# Patient Record
Sex: Female | Born: 1970 | Race: White | Hispanic: No | Marital: Married | Smoking: Former smoker
Health system: Southern US, Community
[De-identification: ages and names within clinical notes are randomized; demographics above are authoritative.]

## PROBLEM LIST (undated history)

## (undated) DIAGNOSIS — Z8489 Family history of other specified conditions: Secondary | ICD-10-CM

## (undated) DIAGNOSIS — F329 Major depressive disorder, single episode, unspecified: Secondary | ICD-10-CM

## (undated) DIAGNOSIS — F419 Anxiety disorder, unspecified: Secondary | ICD-10-CM

## (undated) DIAGNOSIS — F32A Depression, unspecified: Secondary | ICD-10-CM

## (undated) DIAGNOSIS — M797 Fibromyalgia: Secondary | ICD-10-CM

## (undated) DIAGNOSIS — L719 Rosacea, unspecified: Secondary | ICD-10-CM

## (undated) DIAGNOSIS — K219 Gastro-esophageal reflux disease without esophagitis: Secondary | ICD-10-CM

## (undated) DIAGNOSIS — Z8719 Personal history of other diseases of the digestive system: Secondary | ICD-10-CM

## (undated) DIAGNOSIS — Z87442 Personal history of urinary calculi: Secondary | ICD-10-CM

## (undated) DIAGNOSIS — I499 Cardiac arrhythmia, unspecified: Secondary | ICD-10-CM

## (undated) DIAGNOSIS — I340 Nonrheumatic mitral (valve) insufficiency: Secondary | ICD-10-CM

## (undated) DIAGNOSIS — J189 Pneumonia, unspecified organism: Secondary | ICD-10-CM

## (undated) HISTORY — DX: Depression, unspecified: F32.A

## (undated) HISTORY — PX: FRACTURE SURGERY: SHX138

## (undated) HISTORY — PX: LITHOTRIPSY: SUR834

## (undated) HISTORY — PX: JOINT REPLACEMENT: SHX530

## (undated) HISTORY — DX: Cardiac arrhythmia, unspecified: I49.9

## (undated) HISTORY — PX: WISDOM TOOTH EXTRACTION: SHX21

## (undated) HISTORY — PX: CRYOTHERAPY: SHX1416

## (undated) HISTORY — DX: Major depressive disorder, single episode, unspecified: F32.9

---

## 2008-11-05 ENCOUNTER — Emergency Department (HOSPITAL_COMMUNITY): Admission: EM | Admit: 2008-11-05 | Discharge: 2008-11-05 | Payer: Self-pay | Admitting: Emergency Medicine

## 2010-08-31 ENCOUNTER — Encounter: Payer: Self-pay | Admitting: Gastroenterology

## 2010-09-22 ENCOUNTER — Encounter: Payer: Self-pay | Admitting: Gastroenterology

## 2010-09-22 ENCOUNTER — Ambulatory Visit (INDEPENDENT_AMBULATORY_CARE_PROVIDER_SITE_OTHER): Payer: BC Managed Care – PPO | Admitting: Gastroenterology

## 2010-09-22 VITALS — BP 124/72 | HR 80 | Ht 60.0 in | Wt 157.0 lb

## 2010-09-22 DIAGNOSIS — K602 Anal fissure, unspecified: Secondary | ICD-10-CM

## 2010-09-22 DIAGNOSIS — K625 Hemorrhage of anus and rectum: Secondary | ICD-10-CM | POA: Insufficient documentation

## 2010-09-22 MED ORDER — MESALAMINE 1000 MG RE SUPP
RECTAL | Status: DC
Start: 2010-09-22 — End: 2011-07-16

## 2010-09-22 MED ORDER — LIDOCAINE HCL 2 % EX GEL: CUTANEOUS | Status: DC | PRN

## 2010-09-22 NOTE — Progress Notes (Signed)
History of Present Illness:  This is a 40 year old Caucasian female self referred for 2 months of bright red blood per rectum with occasional burning rectal discomfort. She has similar problem in 2000, and apparently had a negative sigmoidoscopy in [place]. He follows a regular diet and denies a specific food intolerances. She otherwise is in good health and is on the medications and several multivitamins. She and her husband are trying to become pregnant, but she has not missed a menstrual period. She denies any urologic or gynecologic problems. She also denies upper GI or general medical issues. She usually has one to 2 bowel movements a day without diarrhea or lower abdominal discomfort.  I have reviewed this patient's present history, medical and surgical past history, allergies and medications.     ROS: The remainder of the 10 point ROS is negative     Physical Exam: General well developed well nourished patient in no acute distress, appearing her stated age Eyes PERRLA, no icterus, fundoscopic exam per opthamologist Skin no lesions noted Neck supple, no adenopathy, no thyroid enlargement, no tenderness Chest clear to percussion and auscultation Heart no significant murmurs, gallops or rubs noted Abdomen no hepatosplenomegaly masses or tenderness, BS normal.  Rectal there is a small superficial anterior fissure noted but no fistulae or perianal lesions. I cannot appreciate rectal masses or tenderness, and stool is guaiac negative. Extremities no acute joint lesions, edema, phlebitis or evidence of cellulitis. Neurologic patient oriented x 3, cranial nerves intact, no focal neurologic deficits noted. Psychological mental status normal and normal affect.  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 I have placed her on Sitz baths at bedtime with 1 g and also suppositories, and when necessary 5% Xylocaine gel, high-fiber diet with  daily Benefiber and liberal by mouth fluids, and we'll schedule colonoscopy at her convenience. We will send this note to [facility].  No diagnosis found.

## 2010-09-22 NOTE — Patient Instructions (Signed)
We have sent Canasa supp use one per rectum every night for 1 week, then as needed. We have sent an rx for Xylocaine gel use per rectum as needed.  Follow the High fiber diet given today.  You were given a handout on rectal fissures.  We will call back to schedule your colonoscopy and previsit when the computers come back up.

## 2010-09-23 ENCOUNTER — Telehealth: Payer: Self-pay | Admitting: Gastroenterology

## 2010-10-22 NOTE — Telephone Encounter (Signed)
Left another Vmail for pt to call & schedule procedure.

## 2010-10-23 ENCOUNTER — Other Ambulatory Visit (HOSPITAL_COMMUNITY): Payer: Self-pay | Admitting: Obstetrics and Gynecology

## 2010-10-23 DIAGNOSIS — N979 Female infertility, unspecified: Secondary | ICD-10-CM

## 2010-10-30 ENCOUNTER — Ambulatory Visit (HOSPITAL_COMMUNITY)
Admission: RE | Admit: 2010-10-30 | Discharge: 2010-10-30 | Disposition: A | Discharge status: Home / Self Care | Payer: BC Managed Care – PPO | Source: Ambulatory Visit | Attending: Obstetrics and Gynecology | Admitting: Obstetrics and Gynecology

## 2010-10-30 DIAGNOSIS — N979 Female infertility, unspecified: Secondary | ICD-10-CM

## 2010-10-30 MED ORDER — IOHEXOL 300 MG/ML  SOLN: 9.0000 mL | Freq: Once | INTRAMUSCULAR | Status: AC | PRN

## 2011-03-01 ENCOUNTER — Ambulatory Visit (AMBULATORY_SURGERY_CENTER): Payer: BC Managed Care – PPO | Admitting: *Deleted

## 2011-03-01 VITALS — Ht 60.0 in | Wt 158.0 lb

## 2011-03-01 DIAGNOSIS — K602 Anal fissure, unspecified: Secondary | ICD-10-CM

## 2011-03-01 DIAGNOSIS — K625 Hemorrhage of anus and rectum: Secondary | ICD-10-CM

## 2011-03-01 MED ORDER — PEG-KCL-NACL-NASULF-NA ASC-C 100 G PO SOLR: ORAL | Status: DC

## 2011-03-02 ENCOUNTER — Encounter: Payer: Self-pay | Admitting: Gastroenterology

## 2011-03-15 ENCOUNTER — Ambulatory Visit (AMBULATORY_SURGERY_CENTER): Payer: BC Managed Care – PPO | Admitting: Gastroenterology

## 2011-03-15 ENCOUNTER — Encounter: Payer: Self-pay | Admitting: Gastroenterology

## 2011-03-15 VITALS — BP 116/86 | HR 85 | Temp 98.4°F | Resp 20 | Ht 60.0 in | Wt 158.0 lb

## 2011-03-15 DIAGNOSIS — K625 Hemorrhage of anus and rectum: Secondary | ICD-10-CM

## 2011-03-15 DIAGNOSIS — K602 Anal fissure, unspecified: Secondary | ICD-10-CM

## 2011-03-15 HISTORY — PX: COLONOSCOPY: SHX174

## 2011-03-15 MED ORDER — SODIUM CHLORIDE 0.9 % IV SOLN: 500.0000 mL | INTRAVENOUS | Status: DC

## 2011-03-15 NOTE — Patient Instructions (Signed)
Resume your prior medications today. Please call if any questions or concerns.   Handout given on high fiber diet.  Per Dr. Sharlett Iles use either metamucil ar benefiber daily.     YOU HAD AN ENDOSCOPIC PROCEDURE TODAY AT [facility]: Refer to the procedure report that was given to you for any specific questions about what was found during the examination.  If the procedure report does not answer your questions, please call your gastroenterologist to clarify.  If you requested that your care partner not be given the details of your procedure findings, then the procedure report has been included in a sealed envelope for you to review at your convenience later.  YOU SHOULD EXPECT: Some feelings of bloating in the abdomen. Passage of more gas than usual.  Walking can help get rid of the air that was put into your GI tract during the procedure and reduce the bloating. If you had a lower endoscopy (such as a colonoscopy or flexible sigmoidoscopy) you may notice spotting of blood in your stool or on the toilet paper. If you underwent a bowel prep for your procedure, then you may not have a normal bowel movement for a few days.  DIET: Your first meal following the procedure should be a light meal and then it is ok to progress to your normal diet.  A half-sandwich or bowl of soup is an example of a good first meal.  Heavy or fried foods are harder to digest and may make you feel nauseous or bloated.  Likewise meals heavy in dairy and vegetables can cause extra gas to form and this can also increase the bloating.  Drink plenty of fluids but you should avoid alcoholic beverages for 24 hours.  ACTIVITY: Your care partner should take you home directly after the procedure.  You should plan to take it easy, moving slowly for the rest of the day.  You can resume normal activity the day after the procedure however you should NOT DRIVE or use heavy machinery for 24 hours (because of the sedation medicines  used during the test).    SYMPTOMS TO REPORT IMMEDIATELY: A gastroenterologist can be reached at any hour.  During normal business hours, 8:30 AM to 5:00 PM Monday through Friday, call [phone].  After hours and on weekends, please call the GI answering service at [phone] who will take a message and have the physician on call contact you.   Following lower endoscopy (colonoscopy or flexible sigmoidoscopy):  Excessive amounts of blood in the stool  Significant tenderness or worsening of abdominal pains  Swelling of the abdomen that is new, acute  Fever of 100F or higher F   FOLLOW UP: If any biopsies were taken you will be contacted by phone or by letter within the next 1-3 weeks.  Call your gastroenterologist if you have not heard about the biopsies in 3 weeks.  Our staff will call the home number listed on your records the next business day following your procedure to check on you and address any questions or concerns that you may have at that time regarding the information given to you following your procedure. This is a courtesy call and so if there is no answer at the home number and we have not heard from you through the emergency physician on call, we will assume that you have returned to your regular daily activities without incident.  SIGNATURES/CONFIDENTIALITY: You and/or your care partner have signed paperwork which will be entered  into your electronic medical record.  These signatures attest to the fact that that the information above on your After Visit Summary has been reviewed and is understood.  Full responsibility of the confidentiality of this discharge information lies with you and/or your care-partner.

## 2011-03-15 NOTE — Progress Notes (Signed)
No complaints noted in the recovery room. Maw  Patient did not have preoperative order for IV antibiotic SSI prophylaxis. [phone]) Patient did not experience any of the following events: a burn prior to discharge; a fall within the facility; wrong site/side/patient/procedure/implant event; or a hospital transfer or hospital admission upon discharge from the facility. [phone])

## 2011-03-15 NOTE — Progress Notes (Signed)
PROPOFOL PER S CAMP CRNA. SEE SCANNED INTRA PROCEDURE REPORT. EWM  PT TOLERATED PROCEDURE WELL WITH NO DIFFICULTIES. EWM

## 2011-03-15 NOTE — Op Note (Signed)
[facility] [address] [facility] [address] [place], [place]  [id number]  COLONOSCOPY PROCEDURE REPORT  PATIENT:  Sarah, Cobb  MR#:  [id number] BIRTHDATE:  20-Mar-1970, 40 yrs. old  GENDER:  female ENDOSCOPIST:  Loralee Pacas. Sharlett Iles, MD, [facility] REF. BY: PROCEDURE DATE:  03/15/2011 PROCEDURE:  Diagnostic Colonoscopy ASA CLASS:  Class I INDICATIONS:  bright red bleeding MEDICATIONS:   propofol (Diprivan) 250 mg IV  DESCRIPTION OF PROCEDURE:   After the risks and benefits and of the procedure were explained, informed consent was obtained. Digital rectal exam was performed and revealed no abnormalities. The LB 180AL [id number] endoscope was introduced through the anus and advanced to the cecum, which was identified by both the appendix and ileocecal valve.  The quality of the prep was excellent, using MoviPrep.  The instrument was then slowly withdrawn as the colon was fully examined. <<PROCEDUREIMAGES>>  FINDINGS:  No polyps or cancers were seen.  This was otherwise a normal examination of the colon. no anal fissure noted. Retroflexed views in the rectum revealed no abnormalities.    The scope was then withdrawn from the patient and the procedure completed.  COMPLICATIONS:  None ENDOSCOPIC IMPRESSION: 1) No polyps or cancers 2) Otherwise normal examination HX. OF ANAL FISSURES RECOMMENDATIONS: 1) High fiber diet. 2) Repeat Colonscopy in 10 years. 3) metamucil or benefiber PRN LOCAL ANAL CARE.  REPEAT EXAM:  No  ______________________________ Loralee Pacas. Sharlett Iles, MD, [facility]  CC:  n. eSIGNED:   Loralee Pacas. Cannan Beeck at 03/15/2011 10:24 AM  Charleen Kirks, [phone]

## 2011-03-16 ENCOUNTER — Telehealth: Payer: Self-pay | Admitting: *Deleted

## 2011-03-16 NOTE — Telephone Encounter (Signed)
Left message on number given in admitting yesterday.

## 2011-03-18 ENCOUNTER — Other Ambulatory Visit: Payer: Self-pay

## 2011-03-18 DIAGNOSIS — Z1231 Encounter for screening mammogram for malignant neoplasm of breast: Secondary | ICD-10-CM

## 2011-03-22 ENCOUNTER — Ambulatory Visit
Admission: RE | Admit: 2011-03-22 | Discharge: 2011-03-22 | Disposition: A | Discharge status: Home / Self Care | Payer: BC Managed Care – PPO | Source: Ambulatory Visit

## 2011-03-22 ENCOUNTER — Other Ambulatory Visit: Payer: Self-pay

## 2011-03-22 DIAGNOSIS — Z1231 Encounter for screening mammogram for malignant neoplasm of breast: Secondary | ICD-10-CM

## 2011-04-09 ENCOUNTER — Ambulatory Visit
Admission: RE | Admit: 2011-04-09 | Discharge: 2011-04-09 | Disposition: A | Discharge status: Home / Self Care | Payer: BC Managed Care – PPO | Source: Ambulatory Visit

## 2011-04-09 DIAGNOSIS — Z1231 Encounter for screening mammogram for malignant neoplasm of breast: Secondary | ICD-10-CM

## 2011-07-16 ENCOUNTER — Emergency Department (HOSPITAL_COMMUNITY)
Admission: EM | Admit: 2011-07-16 | Discharge: 2011-07-16 | Disposition: A | Discharge status: Home / Self Care | Payer: BC Managed Care – PPO | Attending: Emergency Medicine | Admitting: Emergency Medicine

## 2011-07-16 ENCOUNTER — Encounter (HOSPITAL_COMMUNITY): Payer: Self-pay | Admitting: *Deleted

## 2011-07-16 ENCOUNTER — Emergency Department (HOSPITAL_COMMUNITY): Payer: BC Managed Care – PPO

## 2011-07-16 DIAGNOSIS — M25469 Effusion, unspecified knee: Secondary | ICD-10-CM | POA: Insufficient documentation

## 2011-07-16 DIAGNOSIS — F329 Major depressive disorder, single episode, unspecified: Secondary | ICD-10-CM | POA: Insufficient documentation

## 2011-07-16 DIAGNOSIS — W010XXA Fall on same level from slipping, tripping and stumbling without subsequent striking against object, initial encounter: Secondary | ICD-10-CM | POA: Insufficient documentation

## 2011-07-16 DIAGNOSIS — M25569 Pain in unspecified knee: Secondary | ICD-10-CM | POA: Insufficient documentation

## 2011-07-16 DIAGNOSIS — IMO0002 Reserved for concepts with insufficient information to code with codable children: Secondary | ICD-10-CM | POA: Insufficient documentation

## 2011-07-16 DIAGNOSIS — S82009A Unspecified fracture of unspecified patella, initial encounter for closed fracture: Secondary | ICD-10-CM | POA: Insufficient documentation

## 2011-07-16 DIAGNOSIS — F3289 Other specified depressive episodes: Secondary | ICD-10-CM | POA: Insufficient documentation

## 2011-07-16 MED ORDER — HYDROCODONE-ACETAMINOPHEN 10-325 MG PO TABS: 1.0000 | ORAL_TABLET | Freq: Four times a day (QID) | ORAL | Status: DC | PRN

## 2011-07-16 MED ORDER — HYDROMORPHONE HCL PF 1 MG/ML IJ SOLN
1.0000 mg | Freq: Once | INTRAMUSCULAR | Status: AC
  Administered 2011-07-16: 1 mg via INTRAVENOUS
  Filled 2011-07-16: qty 1

## 2011-07-16 NOTE — ED Notes (Signed)
Per ems pt is alert and oriented x4. Pt was walking down sidewalk and tripped and fell onto right knee. Possible deformity noted. Appears to be dislocated patella. Minor scrapes and bleeding from right knee. Scant scrape of left knee. Pedal pulses +2. Small scratch on left upper lip. Pt denies hitting head or loc. Pain 8/10. ems gave 150 fentanyl. 22 g L hand.

## 2011-07-16 NOTE — ED Notes (Signed)
AEP:NT75<GR> Expected date:07/16/11<BR> Expected time: 2:18 PM<BR> Means of arrival:Ambulance<BR> Comments:<BR> Fall-?deformity to knee

## 2011-07-16 NOTE — ED Notes (Signed)
Pt back from x-ray.

## 2011-07-16 NOTE — ED Notes (Signed)
md at bedside  Pt alert and oriented x4. Respirations even and unlabored, bilateral symmetrical rise and fall of chest. Skin warm and dry. In no acute distress. Denies needs.

## 2011-07-16 NOTE — ED Provider Notes (Signed)
History     CSN: [id number]  Arrival date & time 07/16/11  29   First MD Initiated Contact with Patient 07/16/11 1506      Chief Complaint  Patient presents with  . Fall  . Knee Injury    HPI The patient presents immediately after a fall.  She tripped and fell primarily onto her R knee.  Since that time she has had severe pain focally about the anterior knee.  The pain is worse with any type of motion.  No attempts at relief thus far.  No distal weakness or dysesthesia.  No other significant contact, no other  Past Medical History  Diagnosis Date  . Arrhythmia   . Depression     Past Surgical History  Procedure Date  . Wisdom tooth extraction   . Cryotherapy     CERVIX    Family History  Problem Relation Age of Onset  . Adopted: Yes    History  Substance Use Topics  . Smoking status: Former Research scientist (life sciences)  . Smokeless tobacco: Never Used  . Alcohol Use: No    OB History    Grav Para Term Preterm Abortions TAB SAB Ect Mult Living                  Review of Systems  All other systems reviewed and are negative.    Allergies  Valium  Home Medications  No current outpatient prescriptions on file.  BP 128/87  Pulse 84  Temp 97.9 F (36.6 C) (Oral)  Resp 20  SpO2 97%  Physical Exam  Nursing note and vitals reviewed. Constitutional: She appears well-developed and well-nourished. No distress.  HENT:  Head: Normocephalic and atraumatic.  Eyes: Conjunctivae are normal. Pupils are equal, round, and reactive to light. Right eye exhibits no discharge.  Cardiovascular: Normal rate and regular rhythm.   Pulmonary/Chest: Effort normal. No stridor. No respiratory distress.  Abdominal: She exhibits no distension.  Musculoskeletal:       There are superficial abrasions about the anterior right knee, which is diffusely tender to palpation, with mild edema.  The patient is unwilling to flex or extend the knee past her 45 angle. The ankle and foot are unremarkable,  with appropriate refill, pulses, sensation. The hips are stable with no tenderness on motion  Neurological: She is alert.  Skin: Skin is warm and dry.  Psychiatric: She has a normal mood and affect.    ED Course  Procedures (including critical care time)  Labs Reviewed - No data to display No results found.   No diagnosis found.  XR reviewed by me, and d/w ortho.    MDM  This previously well female presents one mechanical fall now with right knee pain, and on exam is found to have a comminuted patella fracture.  The patient is in no distress with unremarkable vital signs, and no other notable complaints.  I discussed the case with orthopedics.  Patient will be discharged following immobilization of her leg, provision of crutches, and will followup in 3 days.     Carmin Muskrat, MD 07/16/11 [phone]

## 2011-07-21 ENCOUNTER — Encounter (HOSPITAL_COMMUNITY): Payer: Self-pay | Admitting: *Deleted

## 2011-07-21 NOTE — Progress Notes (Signed)
1655- attempted to obtain ekg and eccho from 6/13  Ov Dr Chancy Milroy in [place]- answering service answering calls now.   Patient states palpitations- but hasnt had any since seeing Dr Chancy Milroy.   States has scrapes on right knee and surgeon is aware

## 2011-07-22 ENCOUNTER — Encounter (HOSPITAL_COMMUNITY): Payer: Self-pay | Admitting: Anesthesiology

## 2011-07-22 ENCOUNTER — Encounter (HOSPITAL_COMMUNITY)
Admission: RE | Disposition: A | Discharge status: Home / Self Care | Payer: Self-pay | Source: Home / Self Care | Attending: Specialist

## 2011-07-22 ENCOUNTER — Encounter (HOSPITAL_COMMUNITY): Payer: Self-pay | Admitting: *Deleted

## 2011-07-22 ENCOUNTER — Ambulatory Visit (HOSPITAL_COMMUNITY): Payer: BC Managed Care – PPO | Admitting: Anesthesiology

## 2011-07-22 ENCOUNTER — Ambulatory Visit (HOSPITAL_COMMUNITY)
Admission: RE | Admit: 2011-07-22 | Discharge: 2011-07-25 | Disposition: A | Discharge status: Home / Self Care | Payer: BC Managed Care – PPO | Attending: Specialist | Admitting: Specialist

## 2011-07-22 ENCOUNTER — Ambulatory Visit (HOSPITAL_COMMUNITY): Payer: BC Managed Care – PPO

## 2011-07-22 DIAGNOSIS — F329 Major depressive disorder, single episode, unspecified: Secondary | ICD-10-CM | POA: Insufficient documentation

## 2011-07-22 DIAGNOSIS — S82009A Unspecified fracture of unspecified patella, initial encounter for closed fracture: Secondary | ICD-10-CM | POA: Diagnosis present

## 2011-07-22 DIAGNOSIS — Y999 Unspecified external cause status: Secondary | ICD-10-CM | POA: Insufficient documentation

## 2011-07-22 DIAGNOSIS — F3289 Other specified depressive episodes: Secondary | ICD-10-CM | POA: Insufficient documentation

## 2011-07-22 DIAGNOSIS — X58XXXA Exposure to other specified factors, initial encounter: Secondary | ICD-10-CM | POA: Insufficient documentation

## 2011-07-22 DIAGNOSIS — I059 Rheumatic mitral valve disease, unspecified: Secondary | ICD-10-CM | POA: Insufficient documentation

## 2011-07-22 HISTORY — PX: ORIF PATELLA: SHX5033

## 2011-07-22 LAB — BASIC METABOLIC PANEL
BUN: 16 mg/dL (ref 6–23)
Chloride: 100 mEq/L (ref 96–112)
Creatinine, Ser: 0.69 mg/dL (ref 0.50–1.10)
GFR calc Af Amer: >90 mL/min (ref >90)
GFR calc non Af Amer: >90 mL/min (ref >90)
Potassium: 4.1 mEq/L (ref 3.5–5.1)

## 2011-07-22 LAB — CBC
HCT: 36.3 % (ref 36.0–46.0)
Hemoglobin: 12.2 g/dL (ref 12.0–15.0)
MCHC: 33.6 g/dL (ref 30.0–36.0)
RDW: 13.1 % (ref 11.5–15.5)
WBC: 11.5 10*3/uL — ABNORMAL HIGH (ref 4.0–10.5)

## 2011-07-22 LAB — SURGICAL PCR SCREEN
MRSA, PCR: NEGATIVE
Staphylococcus aureus: NEGATIVE

## 2011-07-22 LAB — HCG, SERUM, QUALITATIVE: Preg, Serum: NEGATIVE

## 2011-07-22 SURGERY — OPEN REDUCTION INTERNAL FIXATION (ORIF) PATELLA
Anesthesia: General | Site: Knee | Laterality: Right | Wound class: Clean

## 2011-07-22 MED ORDER — CEFAZOLIN SODIUM 1-5 GM-% IV SOLN
1.0000 g | Freq: Four times a day (QID) | INTRAVENOUS | Status: DC
  Administered 2011-07-22 – 2011-07-23 (×2): 1 g via INTRAVENOUS
  Filled 2011-07-22 (×3): qty 50

## 2011-07-22 MED ORDER — ACETAMINOPHEN 10 MG/ML IV SOLN
INTRAVENOUS | Status: AC
  Filled 2011-07-22: qty 100

## 2011-07-22 MED ORDER — BUPIVACAINE HCL (PF) 0.5 % IJ SOLN
INTRAMUSCULAR | Status: AC
  Filled 2011-07-22: qty 30

## 2011-07-22 MED ORDER — DEXTROSE 5 % IV SOLN: 1.0000 g | INTRAVENOUS | Status: DC | PRN

## 2011-07-22 MED ORDER — LACTATED RINGERS IV SOLN
INTRAVENOUS | Status: DC
  Administered 2011-07-22: 15:00:00 via INTRAVENOUS
  Administered 2011-07-23: 20 mL/h via INTRAVENOUS

## 2011-07-22 MED ORDER — MIDAZOLAM HCL 5 MG/5ML IJ SOLN
INTRAMUSCULAR | Status: DC | PRN
  Administered 2011-07-22: 2 mg via INTRAVENOUS

## 2011-07-22 MED ORDER — CEFAZOLIN SODIUM-DEXTROSE 2-3 GM-% IV SOLR
INTRAVENOUS | Status: AC
  Filled 2011-07-22: qty 50

## 2011-07-22 MED ORDER — METOCLOPRAMIDE HCL 5 MG/ML IJ SOLN: 5.0000 mg | Freq: Three times a day (TID) | INTRAMUSCULAR | Status: DC | PRN

## 2011-07-22 MED ORDER — SODIUM CHLORIDE 0.9 % IR SOLN
Status: DC | PRN
  Administered 2011-07-22: 11:00:00

## 2011-07-22 MED ORDER — LIDOCAINE HCL (CARDIAC) 20 MG/ML IV SOLN
INTRAVENOUS | Status: DC | PRN
  Administered 2011-07-22: 100 mg via INTRAVENOUS

## 2011-07-22 MED ORDER — BUPIVACAINE-EPINEPHRINE (PF) 0.5% -1:200000 IJ SOLN
INTRAMUSCULAR | Status: AC
  Filled 2011-07-22: qty 10

## 2011-07-22 MED ORDER — DROPERIDOL 2.5 MG/ML IJ SOLN
INTRAMUSCULAR | Status: DC | PRN
  Administered 2011-07-22: 0.625 mg via INTRAVENOUS

## 2011-07-22 MED ORDER — LACTATED RINGERS IV SOLN
INTRAVENOUS | Status: DC
  Administered 2011-07-22: 11:00:00 via INTRAVENOUS
  Administered 2011-07-22: 1000 mL via INTRAVENOUS

## 2011-07-22 MED ORDER — ONDANSETRON HCL 4 MG/2ML IJ SOLN
INTRAMUSCULAR | Status: DC | PRN
  Administered 2011-07-22: 4 mg via INTRAVENOUS

## 2011-07-22 MED ORDER — ONDANSETRON HCL 4 MG PO TABS: 4.0000 mg | ORAL_TABLET | Freq: Four times a day (QID) | ORAL | Status: DC | PRN

## 2011-07-22 MED ORDER — OXYCODONE-ACETAMINOPHEN 5-325 MG PO TABS
1.0000 | ORAL_TABLET | ORAL | Status: DC | PRN
  Administered 2011-07-22 (×2): 1 via ORAL
  Administered 2011-07-23 – 2011-07-24 (×6): 2 via ORAL
  Filled 2011-07-22 (×6): qty 2
  Filled 2011-07-22: qty 1
  Filled 2011-07-22 (×2): qty 2

## 2011-07-22 MED ORDER — KETOROLAC TROMETHAMINE 30 MG/ML IJ SOLN: 15.0000 mg | Freq: Once | INTRAMUSCULAR | Status: DC | PRN

## 2011-07-22 MED ORDER — BUPIVACAINE HCL 0.5 % IJ SOLN
INTRAMUSCULAR | Status: DC | PRN
  Administered 2011-07-22: 17 mL

## 2011-07-22 MED ORDER — METOCLOPRAMIDE HCL 10 MG PO TABS: 5.0000 mg | ORAL_TABLET | Freq: Three times a day (TID) | ORAL | Status: DC | PRN

## 2011-07-22 MED ORDER — ONDANSETRON HCL 4 MG/2ML IJ SOLN: 4.0000 mg | Freq: Four times a day (QID) | INTRAMUSCULAR | Status: DC | PRN

## 2011-07-22 MED ORDER — ENOXAPARIN SODIUM 40 MG/0.4ML ~~LOC~~ SOLN
40.0000 mg | SUBCUTANEOUS | Status: DC
  Administered 2011-07-23 – 2011-07-25 (×3): 40 mg via SUBCUTANEOUS
  Filled 2011-07-22 (×4): qty 0.4

## 2011-07-22 MED ORDER — HYDROMORPHONE HCL PF 1 MG/ML IJ SOLN
INTRAMUSCULAR | Status: AC
  Administered 2011-07-22: 1 mg via INTRAVENOUS
  Filled 2011-07-22: qty 1

## 2011-07-22 MED ORDER — HYDROMORPHONE HCL PF 1 MG/ML IJ SOLN
INTRAMUSCULAR | Status: AC
  Administered 2011-07-23: 1 mg via INTRAVENOUS
  Filled 2011-07-22: qty 1

## 2011-07-22 MED ORDER — METHOCARBAMOL 500 MG PO TABS
500.0000 mg | ORAL_TABLET | Freq: Four times a day (QID) | ORAL | Status: DC | PRN
  Administered 2011-07-22 – 2011-07-25 (×8): 500 mg via ORAL
  Filled 2011-07-22 (×8): qty 1

## 2011-07-22 MED ORDER — DEXAMETHASONE SODIUM PHOSPHATE 10 MG/ML IJ SOLN
INTRAMUSCULAR | Status: DC | PRN
  Administered 2011-07-22: 10 mg via INTRAVENOUS

## 2011-07-22 MED ORDER — CEFAZOLIN SODIUM 1-5 GM-% IV SOLN
INTRAVENOUS | Status: DC | PRN
  Administered 2011-07-22: 2 g via INTRAVENOUS

## 2011-07-22 MED ORDER — SCOPOLAMINE 1 MG/3DAYS TD PT72
1.0000 | MEDICATED_PATCH | TRANSDERMAL | Status: DC
  Administered 2011-07-22: 1.5 mg via TRANSDERMAL
  Filled 2011-07-22: qty 1

## 2011-07-22 MED ORDER — PROMETHAZINE HCL 25 MG/ML IJ SOLN: 6.2500 mg | INTRAMUSCULAR | Status: DC | PRN

## 2011-07-22 MED ORDER — HYDROMORPHONE HCL PF 1 MG/ML IJ SOLN
0.5000 mg | INTRAMUSCULAR | Status: DC | PRN
  Administered 2011-07-22 – 2011-07-24 (×5): 1 mg via INTRAVENOUS
  Filled 2011-07-22 (×6): qty 1

## 2011-07-22 MED ORDER — PROPOFOL 10 MG/ML IV BOLUS
INTRAVENOUS | Status: DC | PRN
  Administered 2011-07-22: 180 mg via INTRAVENOUS

## 2011-07-22 MED ORDER — SODIUM CHLORIDE 0.9 % IR SOLN
Status: DC | PRN
  Administered 2011-07-22: 1000 mL

## 2011-07-22 MED ORDER — MUPIROCIN 2 % EX OINT
TOPICAL_OINTMENT | CUTANEOUS | Status: AC
  Filled 2011-07-22: qty 22

## 2011-07-22 MED ORDER — SCOPOLAMINE 1 MG/3DAYS TD PT72
MEDICATED_PATCH | TRANSDERMAL | Status: AC
  Filled 2011-07-22: qty 1

## 2011-07-22 MED ORDER — ACETAMINOPHEN 10 MG/ML IV SOLN
INTRAVENOUS | Status: DC | PRN
  Administered 2011-07-22: 1000 mg via INTRAVENOUS

## 2011-07-22 MED ORDER — FENTANYL CITRATE 0.05 MG/ML IJ SOLN
INTRAMUSCULAR | Status: DC | PRN
  Administered 2011-07-22 (×2): 50 ug via INTRAVENOUS
  Administered 2011-07-22: 100 ug via INTRAVENOUS
  Administered 2011-07-22: 50 ug via INTRAVENOUS

## 2011-07-22 MED ORDER — HYDROMORPHONE HCL PF 1 MG/ML IJ SOLN
0.2500 mg | INTRAMUSCULAR | Status: DC | PRN
  Administered 2011-07-22 (×3): 0.5 mg via INTRAVENOUS

## 2011-07-22 MED ORDER — METHOCARBAMOL 100 MG/ML IJ SOLN
500.0000 mg | Freq: Four times a day (QID) | INTRAVENOUS | Status: DC | PRN
  Filled 2011-07-22: qty 5

## 2011-07-22 SURGICAL SUPPLY — 50 items
BAG ZIPLOCK 12X15 (MISCELLANEOUS) ×2 IMPLANT
BANDAGE ELASTIC 6 VELCRO ST LF (GAUZE/BANDAGES/DRESSINGS) ×2 IMPLANT
BIT DRILL CANN 2.7X625 NONSTRL (BIT) ×2 IMPLANT
BNDG COHESIVE 6X5 TAN STRL LF (GAUZE/BANDAGES/DRESSINGS) ×2 IMPLANT
CLOTH BEACON ORANGE TIMEOUT ST (SAFETY) ×2 IMPLANT
CLSR STERI-STRIP ANTIMIC 1/2X4 (GAUZE/BANDAGES/DRESSINGS) ×2 IMPLANT
COVER MAYO STAND STRL (DRAPES) ×2 IMPLANT
DECANTER SPIKE VIAL GLASS SM (MISCELLANEOUS) ×2 IMPLANT
DRAPE C-ARM 42X72 X-RAY (DRAPES) ×2 IMPLANT
DRSG ADAPTIC 3X8 NADH LF (GAUZE/BANDAGES/DRESSINGS) ×2 IMPLANT
DRSG PAD ABDOMINAL 8X10 ST (GAUZE/BANDAGES/DRESSINGS) ×2 IMPLANT
DURAPREP 26ML APPLICATOR (WOUND CARE) ×2 IMPLANT
ELECT REM PT RETURN 9FT ADLT (ELECTROSURGICAL) ×2
ELECTRODE REM PT RTRN 9FT ADLT (ELECTROSURGICAL) ×1 IMPLANT
GLOVE BIOGEL PI IND STRL 8 (GLOVE) ×1 IMPLANT
GLOVE BIOGEL PI INDICATOR 8 (GLOVE) ×1
GLOVE SURG SS PI 8.0 STRL IVOR (GLOVE) ×4 IMPLANT
GOWN PREVENTION PLUS LG XLONG (DISPOSABLE) ×2 IMPLANT
GOWN STRL NON-REIN LRG LVL3 (GOWN DISPOSABLE) ×2 IMPLANT
GOWN STRL REIN XL XLG (GOWN DISPOSABLE) ×2 IMPLANT
HANDPIECE INTERPULSE COAX TIP (DISPOSABLE) ×1
IV NS 1000ML (IV SOLUTION) ×1
IV NS 1000ML BAXH (IV SOLUTION) ×1 IMPLANT
K-WIRE 1.6X150 (WIRE) ×8
KWIRE 1.6X150 (WIRE) ×4 IMPLANT
MANIFOLD NEPTUNE II (INSTRUMENTS) ×2 IMPLANT
NDL SAFETY ECLIPSE 18X1.5 (NEEDLE) ×1 IMPLANT
NEEDLE ANCHOR KEITH 2 7/8 STR (NEEDLE) ×2 IMPLANT
NEEDLE HYPO 18GX1.5 SHARP (NEEDLE) ×1
PACK ICE MAXI GEL EZY WRAP (MISCELLANEOUS) ×2 IMPLANT
PACK LOWER EXTREMITY WL (CUSTOM PROCEDURE TRAY) ×2 IMPLANT
PADDING CAST COTTON 6X4 STRL (CAST SUPPLIES) ×2 IMPLANT
PASSER SUT SWANSON 36MM LOOP (INSTRUMENTS) IMPLANT
POSITIONER SURGICAL ARM (MISCELLANEOUS) ×2 IMPLANT
SCREW CANN S THRD/36 4.0 (Screw) ×4 IMPLANT
SET HNDPC FAN SPRY TIP SCT (DISPOSABLE) ×1 IMPLANT
SPONGE GAUZE 4X4 12PLY (GAUZE/BANDAGES/DRESSINGS) ×2 IMPLANT
SPONGE LAP 18X18 X RAY DECT (DISPOSABLE) ×6 IMPLANT
SUT FIBERWIRE #2 38 T-5 BLUE (SUTURE) ×4
SUT MNCRL AB 4-0 PS2 18 (SUTURE) ×2 IMPLANT
SUT VIC AB 0 CT1 27 (SUTURE) ×3
SUT VIC AB 0 CT1 27XBRD ANTBC (SUTURE) ×3 IMPLANT
SUT VIC AB 1 CT1 27 (SUTURE) ×1
SUT VIC AB 1 CT1 27XBRD ANTBC (SUTURE) ×1 IMPLANT
SUT VIC AB 2-0 CT1 27 (SUTURE) ×2
SUT VIC AB 2-0 CT1 TAPERPNT 27 (SUTURE) ×2 IMPLANT
SUTURE FIBERWR #2 38 T-5 BLUE (SUTURE) ×2 IMPLANT
SYR 30ML LL (SYRINGE) ×2 IMPLANT
TOWEL OR 17X26 10 PK STRL BLUE (TOWEL DISPOSABLE) ×6 IMPLANT
TOWEL OR NON WOVEN STRL DISP B (DISPOSABLE) ×2 IMPLANT

## 2011-07-22 NOTE — Transfer of Care (Signed)
Immediate Anesthesia Transfer of Care Note  Patient: Sarah Cobb  Procedure(s) Performed: Procedure(s) (LRB): OPEN REDUCTION INTERNAL (ORIF) FIXATION PATELLA (Right)  Patient Location: PACU  Anesthesia Type: General  Level of Consciousness: awake, alert  and oriented  Airway & Oxygen Therapy: Patient Spontanous Breathing and Patient connected to face mask oxygen  Post-op Assessment: Report given to PACU RN and Post -op Vital signs reviewed and stable  Post vital signs: Reviewed and stable  Complications: No apparent anesthesia complications

## 2011-07-22 NOTE — Brief Op Note (Signed)
07/22/2011  1:33 PM  PATIENT:  Charleen Kirks  41 y.o. female  PRE-OPERATIVE DIAGNOSIS:  Right Patella Fracture  POST-OPERATIVE DIAGNOSIS:  Right Patella Fracture  PROCEDURE:  Procedure(s) (LRB): OPEN REDUCTION INTERNAL (ORIF) FIXATION PATELLA (Right)  SURGEON:  Surgeon(s) and Role:    * Johnn Hai, MD - Primary  PHYSICIAN ASSISTANT:   ASSISTANTS: none   ANESTHESIA:   general  EBL:  Total I/O In: 1000 [I.V.:1000] Out: 150 [Blood:150]  BLOOD ADMINISTERED:none  DRAINS: none   LOCAL MEDICATIONS USED:  MARCAINE     SPECIMEN:  No Specimen  DISPOSITION OF SPECIMEN:  N/A  COUNTS:  YES  TOURNIQUET:  * No tourniquets in log *  DICTATION: .Other Dictation: Dictation Number [id number]        PLAN OF CARE: Admit for overnight observation  PATIENT DISPOSITION:  PACU - hemodynamically stable.   Delay start of Pharmacological VTE agent (>24hrs) due to surgical blood loss or risk of bleeding: no

## 2011-07-22 NOTE — H&P (Addendum)
Sarah Cobb is an 41 y.o. female.   Chief Complaint: Right  knee pain HPI: Fracture patella right  Past Medical History  Diagnosis Date  . Depression   . Arrhythmia     Mitral valve regurgitation per pt- OV Dr Chancy Milroy, cardio 6/13- states had eccho and EKG    Past Surgical History  Procedure Date  . Wisdom tooth extraction   . Cryotherapy     CERVIX  . Colonoscopy     Family History  Problem Relation Age of Onset  . Adopted: Yes   Social History:  reports that she quit smoking about 13 years ago. Her smoking use included Cigarettes. She has never used smokeless tobacco. She reports that she does not drink alcohol or use illicit drugs.  Allergies:  Allergies  Allergen Reactions  . Valium Palpitations    MAKES HEART RACE    Medications Prior to Admission  Medication Sig Dispense Refill  . cephALEXin (KEFLEX) 500 MG capsule Take 500 mg by mouth 4 (four) times daily.      Marland Kitchen oxyCODONE-acetaminophen (PERCOCET/ROXICET) 5-325 MG per tablet Take 1 tablet by mouth every 4 (four) hours as needed.      Marland Kitchen HYDROcodone-acetaminophen (NORCO) 10-325 MG per tablet Take 1 tablet by mouth every 6 (six) hours as needed for pain.  20 tablet  0    Results for orders placed during the hospital encounter of 07/22/11 (from the past 48 hour(s))  SURGICAL PCR SCREEN     Status: Normal   Collection Time   07/22/11  8:16 AM      Component Value Range Comment   MRSA, PCR NEGATIVE  NEGATIVE    Staphylococcus aureus NEGATIVE  NEGATIVE   CBC     Status: Abnormal   Collection Time   07/22/11  8:20 AM      Component Value Range Comment   WBC 11.5 (*) 4.0 - 10.5 K/uL    RBC 3.89  3.87 - 5.11 MIL/uL    Hemoglobin 12.2  12.0 - 15.0 g/dL    HCT 36.3  36.0 - 46.0 %    MCV 93.3  78.0 - 100.0 fL    MCH 31.4  26.0 - 34.0 pg    MCHC 33.6  30.0 - 36.0 g/dL    RDW 13.1  11.5 - 15.5 %    Platelets 272  150 - 400 K/uL   HCG, SERUM, QUALITATIVE     Status: Normal   Collection Time   07/22/11  8:20 AM   Component Value Range Comment   Preg, Serum NEGATIVE  NEGATIVE   BASIC METABOLIC PANEL     Status: Abnormal   Collection Time   07/22/11  8:20 AM      Component Value Range Comment   Sodium 136  135 - 145 mEq/L    Potassium 4.1  3.5 - 5.1 mEq/L    Chloride 100  96 - 112 mEq/L    CO2 24  19 - 32 mEq/L    Glucose, Bld 105 (*) 70 - 99 mg/dL    BUN 16  6 - 23 mg/dL    Creatinine, Ser 0.69  0.50 - 1.10 mg/dL    Calcium 9.3  8.4 - 10.5 mg/dL    GFR calc non Af Amer >90  >90 mL/min    GFR calc Af Amer >90  >90 mL/min    No results found.  Review of Systems  Musculoskeletal: Positive for joint pain.  All other systems reviewed and are negative.  Blood pressure 107/67, pulse 87, temperature 98.8 F (37.1 C), resp. rate 20, height 5' (1.524 m), weight 67.132 kg (148 lb), last menstrual period 07/18/2011, SpO2 98.00%. Physical Exam  Nursing note and vitals reviewed. Constitutional: She is oriented to person, place, and time. She appears well-developed and well-nourished.  HENT:  Head: Normocephalic and atraumatic.  Eyes: Pupils are equal, round, and reactive to light.  Neck: Normal range of motion. Neck supple.  Cardiovascular: Normal rate and regular rhythm.   Respiratory: Effort normal and breath sounds normal.  GI: Soft. Bowel sounds are normal.  Musculoskeletal:       Tender right patella. Small abrasion right  knee. NVI No DVT.  Neurological: She is alert and oriented to person, place, and time.  Skin: Skin is warm and dry.     Assessment/Plan  Displaced Right patella  fracture. Plan ORIF. Risks discussed.       Terree Gaultney C 07/22/2011, 10:17 AM

## 2011-07-22 NOTE — Anesthesia Preprocedure Evaluation (Addendum)
Anesthesia Evaluation  Patient identified by MRN, date of birth, ID band Patient awake    Reviewed: Allergy & Precautions, H&P , NPO status , Patient's Chart, lab work & pertinent test results  Airway Mallampati: II TM Distance: <3 FB Neck ROM: Full    Dental No notable dental hx.    Pulmonary neg pulmonary ROS,  breath sounds clear to auscultation  Pulmonary exam normal       Cardiovascular + Valvular Problems/Murmurs MR Rhythm:Regular Rate:Normal     Neuro/Psych negative neurological ROS  negative psych ROS   GI/Hepatic negative GI ROS, Neg liver ROS,   Endo/Other  negative endocrine ROS  Renal/GU negative Renal ROS  negative genitourinary   Musculoskeletal negative musculoskeletal ROS (+)   Abdominal   Peds negative pediatric ROS (+)  Hematology negative hematology ROS (+)   Anesthesia Other Findings   Reproductive/Obstetrics negative OB ROS                          Anesthesia Physical Anesthesia Plan  ASA: II  Anesthesia Plan: General   Post-op Pain Management:    Induction: Intravenous  Airway Management Planned: LMA  Additional Equipment:   Intra-op Plan:   Post-operative Plan: Extubation in OR  Informed Consent: I have reviewed the patients History and Physical, chart, labs and discussed the procedure including the risks, benefits and alternatives for the proposed anesthesia with the patient or authorized representative who has indicated his/her understanding and acceptance.   Dental advisory given  Plan Discussed with: CRNA and Surgeon  Anesthesia Plan Comments:        Anesthesia Quick Evaluation

## 2011-07-22 NOTE — Anesthesia Postprocedure Evaluation (Signed)
  Anesthesia Post-op Note  Patient: Sarah Cobb  Procedure(s) Performed: Procedure(s) (LRB): OPEN REDUCTION INTERNAL (ORIF) FIXATION PATELLA (Right)  Patient Location: PACU  Anesthesia Type: General  Level of Consciousness: awake and alert   Airway and Oxygen Therapy: Patient Spontanous Breathing  Post-op Pain: mild  Post-op Assessment: Post-op Vital signs reviewed, Patient's Cardiovascular Status Stable, Respiratory Function Stable, Patent Airway and No signs of Nausea or vomiting  Post-op Vital Signs: stable  Complications: No apparent anesthesia complications

## 2011-07-23 ENCOUNTER — Encounter (HOSPITAL_COMMUNITY): Payer: Self-pay | Admitting: Specialist

## 2011-07-23 DIAGNOSIS — M79609 Pain in unspecified limb: Secondary | ICD-10-CM

## 2011-07-23 DIAGNOSIS — M7989 Other specified soft tissue disorders: Secondary | ICD-10-CM

## 2011-07-23 MED ORDER — OXYCODONE-ACETAMINOPHEN 5-325 MG PO TABS: 1.0000 | ORAL_TABLET | ORAL | Status: DC | PRN

## 2011-07-23 MED ORDER — ENOXAPARIN SODIUM 40 MG/0.4ML ~~LOC~~ SOLN: 40.0000 mg | SUBCUTANEOUS | Status: DC

## 2011-07-23 MED ORDER — CEFAZOLIN SODIUM 1-5 GM-% IV SOLN
1.0000 g | Freq: Three times a day (TID) | INTRAVENOUS | Status: AC
  Administered 2011-07-23 (×2): 1 g via INTRAVENOUS
  Filled 2011-07-23 (×2): qty 50

## 2011-07-23 MED ORDER — METHOCARBAMOL 500 MG PO TABS: 500.0000 mg | ORAL_TABLET | Freq: Four times a day (QID) | ORAL | Status: AC | PRN

## 2011-07-23 NOTE — Evaluation (Signed)
Physical Therapy Evaluation Patient Details Name: Sarah Cobb MRN: [id number] DOB: [birth year] Today's Date: 07/23/2011 Time: 1740-8144 PT Time Calculation (min): 13 min  PT Assessment / Plan / Recommendation Clinical Impression  41 yo female s/p ORIF R patella. Unable to tolerate weightbearing on R LE so pt utilizes NWB technique. Will follow during stay.     PT Assessment  Patient needs continued PT services    Follow Up Recommendations  Home health PT    Barriers to Discharge        Equipment Recommendations  3 in 1 bedside comode    Recommendations for Other Services     Frequency Min 3X/week    Precautions / Restrictions Precautions Precautions: Knee Precaution Comments: No knee ROM. Ambulate on heel. Required Braces or Orthoses: Knee Immobilizer - Right Knee Immobilizer - Right: On at all times Restrictions Weight Bearing Restrictions: No RUE Weight Bearing: Weight bearing as tolerated   Pertinent Vitals/Pain       Mobility  Bed Mobility Bed Mobility: Supine to Sit Supine to Sit: HOB elevated;With rails;4: Min assist Details for Bed Mobility Assistance: VCs safety, technique, hand placement. Assist for R LE off bed. Increased time.  Transfers Transfers: Sit to Stand;Stand to Sit Sit to Stand: 4: Min assist;With upper extremity assist;From bed Stand to Sit: 4: Min guard;With upper extremity assist;To chair/3-in-1;With armrests Details for Transfer Assistance: VCs safety, technique, hand placement. Assist to rise, stabilize.  Ambulation/Gait Ambulation/Gait Assistance: 4: Min guard Ambulation Distance (Feet): 40 Feet Assistive device: Rolling walker Ambulation/Gait Assistance Details: VCs safety, sequence, technique. Pt unable/hesitant to weightbear on R LE. Pt utilizes NWB technique-hops. 2 attempts to put weight through R LE during static standing but pt unable to tolerate.  Gait Pattern: Step-to pattern;Antalgic    Exercises     PT Diagnosis:  Difficulty walking;Abnormality of gait;Acute pain  PT Problem List: Decreased activity tolerance;Decreased mobility;Pain PT Treatment Interventions: DME instruction;Gait training;Stair training;Functional mobility training;Therapeutic activities;Patient/family education   PT Goals Acute Rehab PT Goals PT Goal Formulation: With patient Time For Goal Achievement: 07/30/11 Potential to Achieve Goals: Good Pt will go Supine/Side to Sit: with supervision PT Goal: Supine/Side to Sit - Progress: Goal set today Pt will go Sit to Supine/Side: with supervision PT Goal: Sit to Supine/Side - Progress: Goal set today Pt will go Sit to Stand: with supervision PT Goal: Sit to Stand - Progress: Goal set today Pt will Ambulate: 51 - 150 feet;with supervision;with least restrictive assistive device PT Goal: Ambulate - Progress: Goal set today Pt will Go Up / Down Stairs: 1-2 stairs;with least restrictive assistive device;with min assist (1 step) PT Goal: Up/Down Stairs - Progress: Goal set today  Visit Information  Last PT Received On: 07/23/11 Assistance Needed: +1    Subjective Data  Subjective: "This is not fun." Patient Stated Goal: Home   Prior [place] Lives With: Spouse Available Help at Discharge: Family Type of Home: House Home Access: Stairs to enter Technical brewer of Steps: 1 Entrance Stairs-Rails: None Home Layout: Two level;Able to live on main level with bedroom/bathroom (on couch) Home Adaptive Equipment: Walker - rolling Prior Function Level of Independence: Independent Able to Take Stairs?: Yes Driving: Yes Communication Communication: No difficulties    Cognition  Overall Cognitive Status: Appears within functional limits for tasks assessed/performed Arousal/Alertness: Awake/alert Orientation Level: Appears intact for tasks assessed Behavior During Session: [facility] for tasks performed    Extremity/Trunk Assessment Right Lower Extremity  Assessment RLE ROM/Strength/Tone: Unable to fully assess;Due  to pain Left Lower Extremity Assessment LLE ROM/Strength/Tone: [facility] for tasks assessed   Balance    End of Session PT - End of Session Equipment Utilized During Treatment: Right knee immobilizer Activity Tolerance: Patient limited by pain Patient left: in chair;with call bell/phone within reach  GP Functional Assessment Tool Used: Clinical judgement Functional Limitation: Mobility: Walking and moving around Mobility: Walking and Moving Around Current Status ([id number]): At least 20 percent but less than 40 percent impaired, limited or restricted Mobility: Walking and Moving Around Goal Status [phone]): At least 1 percent but less than 20 percent impaired, limited or restricted   Weston Anna [facility] 07/23/2011, 2:32 PM [phone]

## 2011-07-23 NOTE — Progress Notes (Signed)
VASCULAR LAB PRELIMINARY  PRELIMINARY  PRELIMINARY  PRELIMINARY  Right lower extremity venous duplex completed.    Preliminary report:  Right:  No obvious evidence of DVT, superficial thrombosis, or Baker's cyst.  Distal thigh not imaged due to bandage.    Andrae Claunch, 07/23/2011, 9:56 AM

## 2011-07-23 NOTE — Op Note (Signed)
Sarah Cobb, Sarah Cobb              ACCOUNT NO.:  [id number]  MEDICAL RECORD NO.:  [id number]  LOCATION:  25                         FACILITY:  [facility]  PHYSICIAN:  Susa Day, M.D.    DATE OF BIRTH:  1970/07/13  DATE OF PROCEDURE:  07/22/2011 DATE OF DISCHARGE:                              OPERATIVE REPORT   PREOPERATIVE DIAGNOSIS:  Displaced comminuted patellar fracture, right.  POSTOPERATIVE DIAGNOSIS:  Displaced comminuted patellar fracture, right.  PROCEDURE PERFORMED:  Open reduction and internal fixation of the right patella.  ANESTHESIA:  General.  ASSISTANT:  Surgical tech.  HISTORY:  This is a 41 year old female who sustained a comminuted displaced fracture of the patella, waiting for soft tissue swelling to reduce.  She was indicated for open reduction and internal fixation. Risks and benefits were discussed including bleeding, infection, malunion nonunion, posttraumatic arthrosis, need for revision, DVT, PE, anesthetic complications, etc.  TECHNIQUE:  With the patient in supine position, after induction of adequate anesthesia and 1 g Kefzol, the right lower extremity was prepped and draped in usual sterile fashion.  The knee was flexed. Midline incision was then made over the patella. Subcutaneous tissue was dissected.  Electrocautery was utilized to achieve hemostasis.  There was a small abrasion laterally.  No evidence of an open fracture.  There was no comminuted patellar fracture.  There was extensive tearing transversally and of the retinaculum.  There was tearing to the patellar ligament and a fracture hematoma was noted.  I debrided the fracture hematoma, irrigated by pulsatile lavage.  We irrigated the joint as well and inspected the joint.  There was no evidence of the chondral lesion on the femoral condyle.  There was significant comminution of the patella noted inferiorly in particular and then also proximally.  After irrigation and debridement of  the wound, we used multiple configurations to reduce the fracture that was quite challenging in that.  The proximal segment and distal segment had multiple comminuted segments.  We used multiple configurations utilizing a tenaculum's suture reduction.  We used a Soil scientist under the articular surface to aid in the reduction as well.  There was a column predominantly on the medial aspect of the patella where there were 2 columns that 1 opposed, seemed to provide a the surface for internal fixation.  There were some anterior cortical breakthroughs as well.  Finally was the reduction. Placed 2 parallel guidepins from the distal to proximal.  As proximally, the noncomminuted bone was more posteriorly and distally, it was more anteriorly.  Starting on the good cortical bone distally, we advanced the guide pin.  There was some difficulty in flexing the knee as well to obtain the appropriate trajectory as it would cause diastasis of the fracture.  Best configuration was retrograde from distal anteriorly to proximal superior.  Two parallel guidepins were then placed using cannulated drill over both of them.  We inserted 2 screws 4-0 cortical 36.  Actually, there was excellent purchase on the medial column and suboptimal on the lateral column.  I then used a FiberWire and advanced it from distal to proximal with a Keith needle through the cannulated screw to utilize a tension band technique, utilizing FiberWire  through the cannulated screws.  The Mellette needle, however, would not pass due to the trajectory of the screw and also felt suboptimal fixation of the screw and removed the lateral column screw, and had just threaded the FiberWire through that.  I then used the FiberWire for a suture as to symphysis.  I did not therefore thread wound through the medial column cannulated screw given the angle of the trajectory, as that was the screw with excellent purchase.  I then oversewed the torn  retinaculum with multiple #1 Vicryl interrupted figure-of-eight sutures, and the periosteum with 2-0 Vicryl simple sutures and multiple small fragments with soft tissue attached to them were attached with suture as well.  In the AP and lateral plane, we actually got the optimal reduction and achievable with this comminuted fracture.  I repaired the longitudinal tear of the patellar ligament as well with the quadriceps tendon and used to thread the FiberWire and the cannulated screws.  Wound copiously irrigated.  Subcu with 2-0 Vicryl simple sutures.  Skin was reapproximated 4-0 subcuticular suture.  Wound reinforced with Steri- Strips.  Sterile dressing applied.  Placed a knee immobilizer. Extubated without difficulty and transported to recovery room in satisfactory condition.  The patient tolerated the procedure well.  No complications.  Assistant was first surgical assist.  Minimal blood loss.     Susa Day, M.D.     Geralynn Rile  D:  07/22/2011  T:  07/23/2011  Job:  [id number]

## 2011-07-23 NOTE — Progress Notes (Signed)
Subjective: 1 Day Post-Op Procedure(s) (LRB): OPEN REDUCTION INTERNAL (ORIF) FIXATION PATELLA (Right) Patient reports pain as 4 on 0-10 scale.    Objective: Vital signs in last 24 hours: Temp:  [98.2 F (36.8 C)-99.3 F (37.4 C)] 98.2 F (36.8 C) (07/19 2548) Pulse Rate:  [67-96] 83  (07/19 0614) Resp:  [12-20] 14  (07/19 0614) BP: (100-141)/(67-85) 113/75 mmHg (07/19 0614) SpO2:  [94 %-100 %] 99 % (07/19 0614) Weight:  [67.132 kg (148 lb)] 67.132 kg (148 lb) (07/18 1455)  Intake/Output from previous day: 07/18 0701 - 07/19 0700 In: 3487.5 [P.O.:780; I.V.:2607.5; IV Piggyback:100] Out: 900 [Urine:750; Blood:150] Intake/Output this shift: Total I/O In: 1070 [P.O.:480; I.V.:490; IV Piggyback:100] Out: 750 [Urine:750]   Basename 07/22/11 0820  HGB 12.2    Basename 07/22/11 0820  WBC 11.5*  RBC 3.89  HCT 36.3  PLT 272    Basename 07/22/11 0820  NA 136  K 4.1  CL 100  CO2 24  BUN 16  CREATININE 0.69  GLUCOSE 105*  CALCIUM 9.3   No results found for this basename: LABPT:2,INR:2 in the last 72 hours  Neurologically intact Sensation intact distally Intact pulses distally Dorsiflexion/Plantar flexion intact Compartment soft. Knee immobilizer slid down leg.  Assessment/Plan: 1 Day Post-Op Procedure(s) (LRB): OPEN REDUCTION INTERNAL (ORIF) FIXATION PATELLA (Right) Advance diet D/C IV fluids Plan for discharge tomorrow. Bledsoe brace. PT PWBAT  on right heel in immobilizer or brace. Lovenox. Doppler R/O dvt.  Brittnae Aschenbrenner C 07/23/2011, 6:42 AM

## 2011-07-24 MED ORDER — HYDROMORPHONE HCL 2 MG PO TABS
2.0000 mg | ORAL_TABLET | ORAL | Status: DC | PRN
  Administered 2011-07-24 – 2011-07-25 (×4): 2 mg via ORAL
  Filled 2011-07-24 (×4): qty 1

## 2011-07-24 MED ORDER — BISACODYL 10 MG RE SUPP: 10.0000 mg | Freq: Every day | RECTAL | Status: DC | PRN

## 2011-07-24 MED ORDER — MENTHOL 3 MG MT LOZG
1.0000 | LOZENGE | OROMUCOSAL | Status: DC | PRN
  Filled 2011-07-24: qty 9

## 2011-07-24 NOTE — Progress Notes (Signed)
Orthopedics Progress Note  Subjective: Pt c/o moderate pain to right patella/knee with movement but tolerable at rest Wearing knee immobilizer currently  Objective:  Filed Vitals:   07/24/11 0430  BP: 121/84  Pulse: 93  Temp: 97.9 F (36.6 C)  Resp: 16    General: Awake and alert  Musculoskeletal: right knee incision healing well, nv intact distally Immobilizer in place Neurovascularly intact  Lab Results  Component Value Date   WBC 11.5* 07/22/2011   HGB 12.2 07/22/2011   HCT 36.3 07/22/2011   MCV 93.3 07/22/2011   PLT 272 07/22/2011       Component Value Date/Time   NA 136 07/22/2011 0820   K 4.1 07/22/2011 0820   CL 100 07/22/2011 0820   CO2 24 07/22/2011 0820   GLUCOSE 105* 07/22/2011 0820   BUN 16 07/22/2011 0820   CREATININE 0.69 07/22/2011 0820   CALCIUM 9.3 07/22/2011 0820   GFRNONAA >90 07/22/2011 0820   GFRAA >90 07/22/2011 0820    No results found for this basename: INR, PROTIME    Assessment/Plan: POD # s/p Procedure(s):right OPEN REDUCTION INTERNAL (ORIF) FIXATION PATELLA  Pt doing fairly well with pain s/p surgery Plan to d/c home today Knee immobilizer or bledsoe brace at all times  Doran Heater. Veverly Fells, MD 07/24/2011 6:39 AM

## 2011-07-24 NOTE — Progress Notes (Signed)
Physical Therapy Treatment Patient Details Name: Sarah Cobb MRN: [id number] DOB: Jun 01, 1970 Today's Date: 07/24/2011 Time: 5520-8022 PT Time Calculation (min): 10 min  PT Assessment / Plan / Recommendation Comments on Treatment Session  Husband present and assisted with bed mobility. Pt still limited by pain. Possible d/c tomorrow.     Follow Up Recommendations  Home health PT    Barriers to Discharge        Equipment Recommendations  3 in 1 bedside comode (pt already has RW)    Recommendations for Other Services    Frequency Min 3X/week   Plan Discharge plan remains appropriate    Precautions / Restrictions Precautions Precautions: Knee Required Braces or Orthoses: Knee Immobilizer - Right Restrictions Weight Bearing Restrictions: No RUE Weight Bearing: Weight bearing as tolerated   Pertinent Vitals/Pain     Mobility  Bed Mobility Bed Mobility: Supine to Sit;Sit to Supine Supine to Sit: 4: Min assist Sit to Supine: 4: Min assist Details for Bed Mobility Assistance: Husband present and assisted pt off/onto bed. Transfers Transfers: Sit to Stand;Stand to Sit Sit to Stand: 4: Min assist;From bed;From chair/3-in-1 Stand to Sit: 4: Min assist;To bed;To chair/3-in-1;With upper extremity assist;With armrests Details for Transfer Assistance: VCs safety, technique hand placement. Assist to rise, stabilize, control descent, support R LE Ambulation/Gait Ambulation/Gait Assistance: 4: Min guard Ambulation Distance (Feet): 15 Feet (x2) Assistive device: Rolling walker Ambulation/Gait Assistance Details: Pt still unable to weightbear on RW. Pt continues to utilize NWB technique. Pt did not want to ambulate further. Still limited by pain. Gait Pattern: Step-to pattern;Antalgic    Exercises     PT Diagnosis:    PT Problem List:   PT Treatment Interventions:     PT Goals Acute Rehab PT Goals Pt will go Supine/Side to Sit: with supervision PT Goal: Supine/Side to Sit -  Progress: Progressing toward goal Pt will go Sit to Supine/Side: with supervision PT Goal: Sit to Supine/Side - Progress: Progressing toward goal Pt will go Sit to Stand: with supervision PT Goal: Sit to Stand - Progress: Progressing toward goal Pt will Ambulate: 51 - 150 feet;with supervision;with least restrictive assistive device PT Goal: Ambulate - Progress: Progressing toward goal  Visit Information  Last PT Received On: 07/24/11 Assistance Needed: +1    Subjective Data  Subjective: "We've had practice" Patient Stated Goal: Home   Cognition  Overall Cognitive Status: Appears within functional limits for tasks assessed/performed Arousal/Alertness: Awake/alert Orientation Level: Appears intact for tasks assessed Behavior During Session: [facility] for tasks performed    Balance     End of Session PT - End of Session Equipment Utilized During Treatment: Right knee immobilizer Activity Tolerance: Patient limited by pain Patient left: in bed;with call bell/phone within reach   GP     Weston Anna [place] 07/24/2011, 4:08 PM [phone]

## 2011-07-24 NOTE — Progress Notes (Signed)
Physical Therapy Note  2nd attempt for PT session. Pt reports she has been medicated and pain is at 2/10,however pt still declined to participate. Will check back later today as schedule permits. Thanks.  Weston Anna, PT  [phone]

## 2011-07-24 NOTE — Progress Notes (Signed)
Orthopedics Progress Note  Subjective: I hurt too much to go home today.  Objective:  Filed Vitals:   07/24/11 1039  BP: 130/74  Pulse: 75  Temp: 97.5 F (36.4 C)  Resp: 16    General: Awake and alert  Musculoskeletal: Dressing intact, Knee immobilizer home. Calf pumps with some pain. Neurovascularly intact  Lab Results  Component Value Date   WBC 11.5* 07/22/2011   HGB 12.2 07/22/2011   HCT 36.3 07/22/2011   MCV 93.3 07/22/2011   PLT 272 07/22/2011       Component Value Date/Time   NA 136 07/22/2011 0820   K 4.1 07/22/2011 0820   CL 100 07/22/2011 0820   CO2 24 07/22/2011 0820   GLUCOSE 105* 07/22/2011 0820   BUN 16 07/22/2011 0820   CREATININE 0.69 07/22/2011 0820   CALCIUM 9.3 07/22/2011 0820   GFRNONAA >90 07/22/2011 0820   GFRAA >90 07/22/2011 0820    No results found for this basename: INR, PROTIME    Assessment/Plan: POD #2  s/p Procedure(s): OPEN REDUCTION INTERNAL (ORIF) FIXATION PATELLA Patient will need change to po Dilaudid to better manage her pain. No BM yet. May need suppository tomorrow. Sore throat, likely related to airway for anesthesia. Cepacol losenges.  Doran Heater. Veverly Fells, MD 07/24/2011 12:09 PM

## 2011-07-24 NOTE — Progress Notes (Signed)
Physical Therapy Note  Attempted tx session to practice step and ambulation. Pt in too much pain at this time. Will check back after pain meds given for session. Thanks. Weston Anna, PT [phone]

## 2011-07-25 MED ORDER — HYDROMORPHONE HCL 2 MG PO TABS: 2.0000 mg | ORAL_TABLET | ORAL | Status: AC | PRN

## 2011-07-25 NOTE — Progress Notes (Signed)
Subjective: 3 Days Post-Op Procedure(s) (LRB): OPEN REDUCTION INTERNAL (ORIF) FIXATION PATELLA (Right) Patient reports pain as 6 on 0-10 scale.  Pain better relieved by dilaudid  Objective: Vital signs in last 24 hours: Temp:  [97.5 F (36.4 C)-98.5 F (36.9 C)] 98.5 F (36.9 C) (07/20 2120) Pulse Rate:  [72-84] 84  (07/20 2120) Resp:  [14-16] 14  (07/20 2120) BP: (114-130)/(74-80) 114/74 mmHg (07/20 2120) SpO2:  [98 %] 98 % (07/20 2120)  Intake/Output from previous day: 07/20 0701 - 07/21 0700 In: 960 [P.O.:960] Out: -  Intake/Output this shift:    No results found for this basename: HGB:5 in the last 72 hours No results found for this basename: WBC:2,RBC:2,HCT:2,PLT:2 in the last 72 hours No results found for this basename: NA:2,K:2,CL:2,CO2:2,BUN:2,CREATININE:2,GLUCOSE:2,CALCIUM:2 in the last 72 hours No results found for this basename: LABPT:2,INR:2 in the last 72 hours  Intact pulses distally Incision: dressing C/D/I  Assessment/Plan: 3 Days Post-Op Procedure(s) (LRB): OPEN REDUCTION INTERNAL (ORIF) FIXATION PATELLA (Right) Discharge today  Change to dilaudid  Follow up 2 weeks explained to patient and husband  Brent Taillon ANDREW 07/25/2011, 9:15 AM

## 2011-07-25 NOTE — Progress Notes (Signed)
Pt stable, scripts, d/c instructions given.  Pt refused physical therapy and stated husband would carry her in the house.  Pt advised about importance of physical therapy..the patient still refused.  Pt transported via wheelchair to private vehicle with NT and husband.

## 2011-07-25 NOTE — Progress Notes (Signed)
Physical Therapy Note  Attempted PT tx session prior to d/c to practice 1 step to enter home. Husband states that he will carry pt into home and that practicing step was not necessary. Pt in agreement and declined to practice. Continue to recommend HHPT.  Weston Anna, PT  [phone]

## 2011-07-25 NOTE — Progress Notes (Addendum)
Cm spoke with patient concerning dc planning with spouse present at bedside. Pt offered choice for [facility]. Per pt choice [facility] to provide [facility] services upon discharge. Pt has access to Rw. Spouse to purchase BSC from outside source. No other needs specified. Spouse to provide tx home. Crissie Reese notifed of new referral.    Arlean Hopping [phone]

## 2011-07-26 NOTE — Discharge Summary (Signed)
Patient ID: Sarah Cobb MRN: [id number] DOB/AGE: 07/02/1970 41 y.o.  Admit date: 07/22/2011 Discharge date: 07/26/2011  Admission Diagnoses: Patella fracture Active Problems:  Patella fracture   Discharge Diagnoses:  Same  Past Medical History  Diagnosis Date  . Depression   . Arrhythmia     Mitral valve regurgitation per pt- OV Dr Chancy Milroy, cardio 6/13- states had eccho and EKG    Surgeries: Procedure(s): OPEN REDUCTION INTERNAL (ORIF) FIXATION PATELLA on 07/22/2011   Consultants:    Discharged Condition: Improved  Hospital Course: Sarah Cobb is an 41 y.o. female who was admitted 07/22/2011 for operative treatment of<principal problem not specified>. Patient has severe unremitting pain that affects sleep, daily activities, and work/hobbies. After pre-op clearance the patient was taken to the operating room on 07/22/2011 and underwent  Procedure(s): OPEN REDUCTION INTERNAL (ORIF) FIXATION PATELLA.  Tolerated well. No complications  Patient was given perioperative antibiotics: Anti-infectives     Start     Dose/Rate Route Frequency Ordered Stop   07/23/11 1100   ceFAZolin (ANCEF) IVPB 1 g/50 mL premix        1 g 100 mL/hr over 30 Minutes Intravenous Every 8 hours 07/23/11 0652 07/23/11 1926   07/22/11 1700   ceFAZolin (ANCEF) IVPB 1 g/50 mL premix  Status:  Discontinued        1 g 100 mL/hr over 30 Minutes Intravenous Every 6 hours 07/22/11 1507 07/23/11 0659   07/22/11 1107   polymyxin B 500,000 Units, bacitracin 50,000 Units in sodium chloride irrigation 0.9 % 500 mL irrigation  Status:  Discontinued          As needed 07/22/11 1107 07/22/11 1341           Patient was given sequential compression devices, early ambulation, and chemoprophylaxis to prevent DVT.  Patient benefited maximally from hospital stay and there were no complications.    Recent vital signs: Patient Vitals for the past 24 hrs:  BP Temp Temp src Pulse Resp SpO2  07/25/11 0937 114/78 mmHg 98.2  F (36.8 C) Axillary 73  16  93 %     Recent laboratory studies: No results found for this basename: WBC:2,HGB:2,HCT:2,PLT:2,NA:2,K:2,CL:2,CO2:2,BUN:2,CREATININE:2,GLUCOSE:2,PT:2,INR:2,CALCIUM,2: in the last 72 hours   Discharge Medications:   Medication List  As of 07/26/2011  7:00 AM   STOP taking these medications         cephALEXin 500 MG capsule      HYDROcodone-acetaminophen 10-325 MG per tablet      oxyCODONE-acetaminophen 5-325 MG per tablet         TAKE these medications         enoxaparin 40 MG/0.4ML injection   Commonly known as: LOVENOX   Inject 0.4 mLs (40 mg total) into the skin daily.      HYDROmorphone 2 MG tablet   Commonly known as: DILAUDID   Take 1-2 tablets (2-4 mg total) by mouth every 3 (three) hours as needed for pain.      methocarbamol 500 MG tablet   Commonly known as: ROBAXIN   Take 1 tablet (500 mg total) by mouth every 6 (six) hours as needed.            Diagnostic Studies: Dg Knee 1-2 Views Right  07/22/2011  *RADIOLOGY REPORT*  Clinical Data: Knee pain  DG C-ARM 61-120 MIN - NRPT [facility],RIGHT KNEE - 1-2 VIEW  Comparison: 07/16/2011  Findings: C-arm films document open reduction internal fixation of a patellar fracture.  Improved position and alignment.  IMPRESSION: As above.  Original Report Authenticated By: Staci Righter, M.D.   Dg Knee 4 Views W/patella Right  07/16/2011  *RADIOLOGY REPORT*  Clinical Data: Fall.  Anterior knee pain and swelling.  RIGHT KNEE - COMPLETE 4+ VIEW  Comparison: None.  Findings: The comminuted fracture through the mid portion of the patella is seen.  There is prepatellar soft tissue swelling as well as soft tissue gas. No radiopaque foreign body identified.  No other fractures are identified.  There is no evidence of knee joint effusion.  No evidence of arthropathy or other significant bone abnormality.  IMPRESSION: Comminuted patellar fracture, with prepatellar soft tissue swelling and subcutaneous emphysema.  No  evidence of radiopaque foreign body.  Original Report Authenticated By: Marlaine Hind, M.D.   Dg C-arm 61-120 Min-no Report  07/22/2011  *RADIOLOGY REPORT*  Clinical Data: Knee pain  DG C-ARM 61-120 MIN - [facility]PT [facility],RIGHT KNEE - 1-2 VIEW  Comparison: 07/16/2011  Findings: C-arm films document open reduction internal fixation of a patellar fracture.  Improved position and alignment.  IMPRESSION: As above.  Original Report Authenticated By: Staci Righter, M.D.    Disposition: 01-Home or Self Care  Discharge Orders    Future Orders Please Complete By Expires   Diet - low sodium heart healthy      Diet - low sodium heart healthy      Call MD / Call 911      Comments:   If you experience chest pain or shortness of breath, CALL 911 and be transported to the hospital emergency room.  If you develope a fever above 101 F, pus (white drainage) or increased drainage or redness at the wound, or calf pain, call your surgeon's office.   Constipation Prevention      Comments:   Drink plenty of fluids.  Prune juice may be helpful.  You may use a stool softener, such as Colace (over the counter) 100 mg twice a day.  Use MiraLax (over the counter) for constipation as needed.   Increase activity slowly as tolerated      Discharge instructions      Comments:   Change dressing once a day. Wear brace at all times. Weight bear on right heel only with use of walker.   Call MD / Call 911      Comments:   If you experience chest pain or shortness of breath, CALL 911 and be transported to the hospital emergency room.  If you develope a fever above 101 F, pus (white drainage) or increased drainage or redness at the wound, or calf pain, call your surgeon's office.   Constipation Prevention      Comments:   Drink plenty of fluids.  Prune juice may be helpful.  You may use a stool softener, such as Colace (over the counter) 100 mg twice a day.  Use MiraLax (over the counter) for constipation as needed.   Increase  activity slowly as tolerated         Follow-up Information    Follow up with Riane Rung C, MD in 10 days.   Contact information:   [facility] [address] [place] [phone]           Signed: Johnn Hai 07/26/2011, 7:00 AM

## 2011-12-15 ENCOUNTER — Emergency Department (HOSPITAL_COMMUNITY): Payer: BC Managed Care – PPO

## 2011-12-15 ENCOUNTER — Emergency Department (HOSPITAL_COMMUNITY)
Admission: EM | Admit: 2011-12-15 | Discharge: 2011-12-15 | Disposition: A | Discharge status: Home / Self Care | Payer: BC Managed Care – PPO | Attending: Emergency Medicine | Admitting: Emergency Medicine

## 2011-12-15 ENCOUNTER — Encounter (HOSPITAL_COMMUNITY): Payer: Self-pay | Admitting: Emergency Medicine

## 2011-12-15 DIAGNOSIS — R339 Retention of urine, unspecified: Secondary | ICD-10-CM | POA: Insufficient documentation

## 2011-12-15 DIAGNOSIS — N132 Hydronephrosis with renal and ureteral calculous obstruction: Secondary | ICD-10-CM

## 2011-12-15 DIAGNOSIS — Z9889 Other specified postprocedural states: Secondary | ICD-10-CM | POA: Insufficient documentation

## 2011-12-15 DIAGNOSIS — Z7982 Long term (current) use of aspirin: Secondary | ICD-10-CM | POA: Insufficient documentation

## 2011-12-15 DIAGNOSIS — R509 Fever, unspecified: Secondary | ICD-10-CM | POA: Insufficient documentation

## 2011-12-15 DIAGNOSIS — N1330 Unspecified hydronephrosis: Secondary | ICD-10-CM | POA: Insufficient documentation

## 2011-12-15 DIAGNOSIS — Z87891 Personal history of nicotine dependence: Secondary | ICD-10-CM | POA: Insufficient documentation

## 2011-12-15 DIAGNOSIS — Z8679 Personal history of other diseases of the circulatory system: Secondary | ICD-10-CM | POA: Insufficient documentation

## 2011-12-15 DIAGNOSIS — Z8659 Personal history of other mental and behavioral disorders: Secondary | ICD-10-CM | POA: Insufficient documentation

## 2011-12-15 DIAGNOSIS — R739 Hyperglycemia, unspecified: Secondary | ICD-10-CM

## 2011-12-15 DIAGNOSIS — N201 Calculus of ureter: Secondary | ICD-10-CM | POA: Insufficient documentation

## 2011-12-15 DIAGNOSIS — R7309 Other abnormal glucose: Secondary | ICD-10-CM | POA: Insufficient documentation

## 2011-12-15 LAB — CBC
Platelets: 305 10*3/uL (ref 150–400)
RBC: 4.34 MIL/uL (ref 3.87–5.11)
WBC: 16.3 10*3/uL — ABNORMAL HIGH (ref 4.0–10.5)

## 2011-12-15 LAB — BASIC METABOLIC PANEL
CO2: 20 mEq/L (ref 19–32)
Chloride: 99 mEq/L (ref 96–112)
Potassium: 3.3 mEq/L — ABNORMAL LOW (ref 3.5–5.1)
Sodium: 137 mEq/L (ref 135–145)

## 2011-12-15 LAB — URINALYSIS, ROUTINE W REFLEX MICROSCOPIC
Glucose, UA: NEGATIVE mg/dL
Leukocytes, UA: NEGATIVE
Protein, ur: 30 mg/dL — AB
pH: 5.5 (ref 5.0–8.0)

## 2011-12-15 LAB — URINE MICROSCOPIC-ADD ON

## 2011-12-15 MED ORDER — TRAMADOL HCL 50 MG PO TABS: 50.0000 mg | ORAL_TABLET | Freq: Four times a day (QID) | ORAL | Status: DC | PRN

## 2011-12-15 MED ORDER — KETOROLAC TROMETHAMINE 30 MG/ML IJ SOLN
30.0000 mg | Freq: Once | INTRAMUSCULAR | Status: AC
  Administered 2011-12-15: 30 mg via INTRAVENOUS
  Filled 2011-12-15: qty 1

## 2011-12-15 MED ORDER — SODIUM CHLORIDE 0.9 % IV BOLUS (SEPSIS)
1000.0000 mL | Freq: Once | INTRAVENOUS | Status: AC
  Administered 2011-12-15: 1000 mL via INTRAVENOUS

## 2011-12-15 MED ORDER — SODIUM CHLORIDE 0.9 % IV SOLN
Freq: Once | INTRAVENOUS | Status: AC
  Administered 2011-12-15: 20 mL/h via INTRAVENOUS

## 2011-12-15 MED ORDER — HYDROMORPHONE HCL PF 1 MG/ML IJ SOLN
1.0000 mg | Freq: Once | INTRAMUSCULAR | Status: AC
  Administered 2011-12-15: 1 mg via INTRAVENOUS
  Filled 2011-12-15: qty 1

## 2011-12-15 NOTE — ED Provider Notes (Signed)
Medical screening examination/treatment/procedure(s) were performed by non-physician practitioner and as supervising physician I was immediately available for consultation/collaboration.  Leota Jacobsen, MD 12/15/11 [phone]

## 2011-12-15 NOTE — ED Provider Notes (Signed)
History     CSN: [id number]  Arrival date & time 12/15/11  1616   First MD Initiated Contact with Patient 12/15/11 1646      Chief Complaint  Patient presents with  . Flank Pain  . Urinary Retention    (Consider location/radiation/quality/duration/timing/severity/associated sxs/prior treatment) HPI  Pt was medically screened and orders placed orders for cbc, bmet, ua, and ct abdomen/pelvis w/o conterast before initial interview with patient  41 year old female presents to the emergency department with sudden onset right flank pain at 1:30 this afternoon. States she woke up around noon today and felt fine, went to the bathroom and went about her day. A little while later she developed the sudden onset right-sided pain. She has been unable to urinate since. Pain is constant rated 10 out of 10. No pain with urination. Feels feverish with chills. No nausea or vomiting. Patient appears in a great amount of pain.   Pt had knee surgery requiring ORIF of patella after fracture. She has dilaudid PO at home and says it has not helped her pain.  Past Medical History  Diagnosis Date  . Depression   . Arrhythmia     Mitral valve regurgitation per pt- OV Dr Chancy Milroy, cardio 6/13- states had eccho and EKG    Past Surgical History  Procedure Date  . Wisdom tooth extraction   . Cryotherapy     CERVIX  . Colonoscopy   . Orif patella 07/22/2011    Procedure: OPEN REDUCTION INTERNAL (ORIF) FIXATION PATELLA;  Surgeon: Johnn Hai, MD;  Location: [facility];  Service: Orthopedics;  Laterality: Right;    Family History  Problem Relation Age of Onset  . Adopted: Yes    History  Substance Use Topics  . Smoking status: Former Smoker    Types: Cigarettes    Quit date: 07/21/1998  . Smokeless tobacco: Never Used  . Alcohol Use: No     Comment: socially    OB History    Grav Para Term Preterm Abortions TAB SAB Ect Mult Living                  Review of Systems  Review of Systems  Gen:  no weight loss, fevers, chills, night sweats  Neck: no neck pain  Lungs:No wheezing, coughing or hemoptysis CV: no chest pain, palpitations, dependent edema or orthopnea  Abd: + right flank abdominal pain, with mild nausea, no vomiting  GU: no dysuria or gross hematuria  MSK:  No abnormalities  Neuro: no headache, no focal neurologic deficits  Skin: no abnormalities Psyche: negative.   Allergies  Valium  Home Medications   Current Outpatient Rx  Name  Route  Sig  Dispense  Refill  . ASPIRIN EC 81 MG PO TBEC   Oral   Take 81 mg by mouth daily.         Marland Kitchen HYDROMORPHONE HCL 2 MG PO TABS   Oral   Take 2 mg by mouth every 4 (four) hours as needed. pain         . METHOCARBAMOL 500 MG PO TABS   Oral   Take 500 mg by mouth every 6 (six) hours as needed. Muscle spasm           BP 133/75  Pulse 74  Temp 98 F (36.7 C) (Oral)  Resp 16  SpO2 100%  LMP 12/01/2011  Physical Exam  Nursing note and vitals reviewed. Constitutional: She appears well-developed and well-nourished. No distress.  HENT:  Head:  Normocephalic and atraumatic.  Eyes: Pupils are equal, round, and reactive to light.  Neck: Normal range of motion. Neck supple.  Cardiovascular: Normal rate and regular rhythm.   Pulmonary/Chest: Effort normal.  Abdominal: Soft. She exhibits no distension and no mass. There is tenderness (mild right CVA tenderness). There is no rebound and no guarding.  Neurological: She is alert.  Skin: Skin is warm and dry.    ED Course  Procedures (including critical care time)  Labs Reviewed  CBC - Abnormal; Notable for the following:    WBC 16.3 (*)     All other components within normal limits  BASIC METABOLIC PANEL - Abnormal; Notable for the following:    Potassium 3.3 (*)     Glucose, Bld 127 (*)     GFR calc non Af Amer 77 (*)     GFR calc Af Amer 89 (*)     All other components within normal limits  URINALYSIS, ROUTINE W REFLEX MICROSCOPIC - Abnormal; Notable for  the following:    Color, Urine AMBER (*)  BIOCHEMICALS MAY BE AFFECTED BY COLOR   APPearance CLOUDY (*)     Specific Gravity, Urine 1.036 (*)     Hgb urine dipstick TRACE (*)     Bilirubin Urine SMALL (*)     Ketones, ur >80 (*)     Protein, ur 30 (*)     All other components within normal limits  URINE MICROSCOPIC-ADD ON - Abnormal; Notable for the following:    Squamous Epithelial / LPF MANY (*)     Bacteria, UA FEW (*)     All other components within normal limits   Ct Abdomen Pelvis Wo Contrast  12/15/2011  *RADIOLOGY REPORT*  Clinical Data: Acute onset right flank pain, unable to urinate since  CT ABDOMEN AND PELVIS WITHOUT CONTRAST  Technique:  Multidetector CT imaging of the abdomen and pelvis was performed following the standard protocol without intravenous contrast. Oral contrast not administered for this indication. Sagittal and coronal MPR images reconstructed from axial data set.  Comparison: None  Findings: Lung bases clear. Right hydronephrosis and hydroureter terminating at a 2 mm right ureterovesicle junction calculus. Additional tiny nonobstructing calculus upper pole right kidney. Within limits of a nonenhanced exam, liver, spleen, pancreas, left kidney, and adrenal glands normal appearance. Normal appendix. Unremarkable uterus and adnexae. Small amount of nonspecific low attenuation free pelvic fluid. Stomach and bowel loops unremarkable for technique. No mass, adenopathy, or hernia. Bones unremarkable.  IMPRESSION: Right hydronephrosis and hydroureter secondary to a 2 mm right UVJ calculus. Additional tiny nonobstructing right renal calculus. Small amount of nonspecific low attenuation free pelvic fluid.   Original Report Authenticated By: Lavonia Dana, M.D.      1. Ureteral stone with hydronephrosis   2. High blood sugar       MDM  Pt has few bacteria in the urine but she has no leukocytes or nitrites. She has ketones and protein, which we would expect when passing a  stone. This should resolve. She also has borderline elevated blood sugar. I discussed this with her, life style modifications and that she needs to have this and her urine rechecked.  Will give strainer and urology referral. No stones left in kidneys noted on CT sscan. Will give Rx for Tramadol  Pt has been advised of the symptoms that warrant their return to the ED. Patient has voiced understanding and has agreed to follow-up with the PCP or specialist.  Linus Mako, PA 12/15/11 1857

## 2011-12-15 NOTE — ED Provider Notes (Signed)
Medical screening examination/treatment/procedure(s) were performed by non-physician practitioner and as supervising physician I was immediately available for consultation/collaboration. Rolland Porter, MD, Abram Sander   Janice Norrie, MD 12/15/11 [phone]

## 2011-12-15 NOTE — ED Notes (Signed)
Carlota Raspberry, PA at bedside for update.

## 2011-12-15 NOTE — ED Notes (Signed)
Carlota Raspberry, [place] at bedside

## 2011-12-15 NOTE — ED Notes (Signed)
Pt presents to the [facility] department with a complaint of flank pain and urinary retention.  Pt. States " I got up at noon and went to the bathroom and felt fine then around one I had a great pain on my right side."

## 2011-12-15 NOTE — ED Provider Notes (Signed)
MSE was initiated and I personally evaluated the patient and placed orders for cbc, bmet, ua, and ct abdomen/pelvis w/o conterast at  4:47 PM on December 34, 8659. 41 year old female presents to the emergency department with sudden onset right flank pain at 1:30 this afternoon. States she woke up around noon today and felt fine, went to the bathroom and went about her day. A little while later she developed the sudden onset right-sided pain. She has been unable to urinate since. Pain is constant rated 10 out of 10. No pain with urination. Feels feverish with chills. No nausea or vomiting. Patient appears in a great amount of pain. On exam, patient has general tenderness in the right side of her abdomen which creates an "uncomfortable" feeling. No guarding or rebound tenderness. Very mild right-sided CVA tenderness.  The patient appears stable so that the remainder of the MSE may be completed by another provider.  Illene Labrador, PA-C 12/15/11 1649

## 2011-12-15 NOTE — ED Notes (Signed)
Patient is resting comfortably. 

## 2012-01-24 NOTE — Progress Notes (Signed)
Patient is scheduled for surgery on 02/03/12.  Patient has preop appointment on 01/26/12  At 100pm.  Need orders in EPIC.  Thanks.

## 2012-01-25 ENCOUNTER — Other Ambulatory Visit (HOSPITAL_COMMUNITY): Payer: Self-pay | Admitting: Specialist

## 2012-01-25 ENCOUNTER — Other Ambulatory Visit: Payer: Self-pay | Admitting: Orthopedic Surgery

## 2012-01-25 NOTE — Progress Notes (Signed)
06-30-2011 echo dr s Humphrey Rolls on chart 3-11-20122 stress test dr s Humphrey Rolls on chart

## 2012-01-26 ENCOUNTER — Encounter (HOSPITAL_COMMUNITY)
Admission: RE | Admit: 2012-01-26 | Discharge: 2012-01-26 | Disposition: A | Discharge status: Home / Self Care | Payer: BC Managed Care – PPO | Source: Ambulatory Visit | Attending: Specialist | Admitting: Specialist

## 2012-01-26 ENCOUNTER — Encounter (HOSPITAL_COMMUNITY): Payer: Self-pay

## 2012-01-26 ENCOUNTER — Encounter (HOSPITAL_COMMUNITY): Payer: Self-pay | Admitting: Pharmacy Technician

## 2012-01-26 HISTORY — DX: Rosacea, unspecified: L71.9

## 2012-01-26 HISTORY — DX: Personal history of urinary calculi: Z87.442

## 2012-01-26 LAB — CBC
Hemoglobin: 13 g/dL (ref 12.0–15.0)
MCH: 31.6 pg (ref 26.0–34.0)
MCV: 91.5 fL (ref 78.0–100.0)
RBC: 4.12 MIL/uL (ref 3.87–5.11)
WBC: 11.3 10*3/uL — ABNORMAL HIGH (ref 4.0–10.5)

## 2012-01-26 LAB — SURGICAL PCR SCREEN: Staphylococcus aureus: NEGATIVE

## 2012-01-26 LAB — HCG, SERUM, QUALITATIVE: Preg, Serum: NEGATIVE

## 2012-01-26 NOTE — Patient Instructions (Addendum)
Sarah Cobb  01/26/2012                           YOUR PROCEDURE IS SCHEDULED ON:  02/03/12               PLEASE REPORT TO SHORT STAY CENTER AT : 10:00 AM               CALL THIS NUMBER IF ANY PROBLEMS THE DAY OF SURGERY :               [phone]                      REMEMBER:   Do not eat food or drink liquids AFTER MIDNIGHT  May have clear liquids UNTIL 6 HOURS BEFORE SURGERY (6:30 AM)  Clear liquids include soda, tea, black coffee, apple or grape juice, broth.  Take these medicines the morning of surgery with A SIP OF WATER:  DILAUDID OR ROBAXIN OR TRAMADOL IF NEEDED FOR PAIN   Do not wear jewelry, make-up   Do not wear lotions, powders, or perfumes.   Do not shave legs or underarms 12 hrs. before surgery (men may shave face)  Do not bring valuables to the hospital.  Contacts, dentures or bridgework may not be worn into surgery.  Leave suitcase in the car. After surgery it may be brought to your room.  For patients admitted to the hospital more than one night, checkout time is 11:00                          The day of discharge.   Patients discharged the day of surgery will not be allowed to drive home                             If going home same day of surgery, must have someone stay with you first                           24 hrs at home and arrange for some one to drive you home from hospital.    Special Instructions:   Please read over the following fact sheets that you were given:               1. MRSA  INFORMATION                      2. [place]               3. [place]                                                X_____________________________________________________________________        Failure to follow these instructions may result in cancellation of your surgery

## 2012-02-03 ENCOUNTER — Encounter (HOSPITAL_COMMUNITY)
Admission: RE | Disposition: A | Discharge status: Home / Self Care | Payer: Self-pay | Source: Ambulatory Visit | Attending: Specialist

## 2012-02-03 ENCOUNTER — Ambulatory Visit (HOSPITAL_COMMUNITY): Payer: BC Managed Care – PPO | Admitting: Anesthesiology

## 2012-02-03 ENCOUNTER — Encounter (HOSPITAL_COMMUNITY): Payer: Self-pay | Admitting: Anesthesiology

## 2012-02-03 ENCOUNTER — Encounter (HOSPITAL_COMMUNITY): Payer: Self-pay | Admitting: *Deleted

## 2012-02-03 ENCOUNTER — Ambulatory Visit (HOSPITAL_COMMUNITY)
Admission: RE | Admit: 2012-02-03 | Discharge: 2012-02-04 | Disposition: A | Discharge status: Home / Self Care | Payer: BC Managed Care – PPO | Source: Ambulatory Visit | Attending: Specialist | Admitting: Specialist

## 2012-02-03 DIAGNOSIS — M2469 Ankylosis, other specified joint: Secondary | ICD-10-CM | POA: Insufficient documentation

## 2012-02-03 DIAGNOSIS — I059 Rheumatic mitral valve disease, unspecified: Secondary | ICD-10-CM | POA: Insufficient documentation

## 2012-02-03 DIAGNOSIS — Z79899 Other long term (current) drug therapy: Secondary | ICD-10-CM | POA: Insufficient documentation

## 2012-02-03 DIAGNOSIS — M24669 Ankylosis, unspecified knee: Secondary | ICD-10-CM | POA: Insufficient documentation

## 2012-02-03 DIAGNOSIS — Z9889 Other specified postprocedural states: Secondary | ICD-10-CM

## 2012-02-03 DIAGNOSIS — Z7982 Long term (current) use of aspirin: Secondary | ICD-10-CM | POA: Insufficient documentation

## 2012-02-03 HISTORY — PX: KNEE ARTHROSCOPY: SHX127

## 2012-02-03 LAB — CBC
HCT: 36.7 % (ref 36.0–46.0)
Hemoglobin: 12.5 g/dL (ref 12.0–15.0)
MCH: 31.3 pg (ref 26.0–34.0)
MCHC: 34.1 g/dL (ref 30.0–36.0)
MCV: 92 fL (ref 78.0–100.0)
RDW: 12.3 % (ref 11.5–15.5)

## 2012-02-03 LAB — CREATININE, SERUM: GFR calc non Af Amer: >90 mL/min (ref >90)

## 2012-02-03 SURGERY — ARTHROSCOPY, KNEE
Anesthesia: General | Site: Knee | Laterality: Right | Wound class: Clean

## 2012-02-03 MED ORDER — ACETAMINOPHEN 10 MG/ML IV SOLN: 1000.0000 mg | Freq: Once | INTRAVENOUS | Status: DC | PRN

## 2012-02-03 MED ORDER — HYDROMORPHONE HCL PF 1 MG/ML IJ SOLN
1.0000 mg | INTRAMUSCULAR | Status: DC | PRN
  Administered 2012-02-03 – 2012-02-04 (×3): 1 mg via INTRAVENOUS
  Filled 2012-02-03 (×3): qty 1

## 2012-02-03 MED ORDER — MEPERIDINE HCL 50 MG/ML IJ SOLN: 6.2500 mg | INTRAMUSCULAR | Status: DC | PRN

## 2012-02-03 MED ORDER — CEFAZOLIN SODIUM-DEXTROSE 2-3 GM-% IV SOLR
2.0000 g | INTRAVENOUS | Status: AC
  Administered 2012-02-03: 2 g via INTRAVENOUS

## 2012-02-03 MED ORDER — CEFAZOLIN SODIUM-DEXTROSE 2-3 GM-% IV SOLR
2.0000 g | Freq: Four times a day (QID) | INTRAVENOUS | Status: AC
  Administered 2012-02-03 – 2012-02-04 (×2): 2 g via INTRAVENOUS
  Filled 2012-02-03 (×3): qty 50

## 2012-02-03 MED ORDER — ROPIVACAINE HCL 5 MG/ML IJ SOLN
INTRAMUSCULAR | Status: AC
  Filled 2012-02-03: qty 30

## 2012-02-03 MED ORDER — TRAMADOL HCL 50 MG PO TABS: 50.0000 mg | ORAL_TABLET | Freq: Four times a day (QID) | ORAL | Status: DC | PRN

## 2012-02-03 MED ORDER — CHLORHEXIDINE GLUCONATE 4 % EX LIQD: 60.0000 mL | Freq: Once | CUTANEOUS | Status: DC

## 2012-02-03 MED ORDER — EPINEPHRINE HCL 1 MG/ML IJ SOLN
INTRAMUSCULAR | Status: DC | PRN
  Administered 2012-02-03: 2 mg

## 2012-02-03 MED ORDER — MIDAZOLAM HCL 5 MG/5ML IJ SOLN
INTRAMUSCULAR | Status: DC | PRN
  Administered 2012-02-03: 1 mg via INTRAVENOUS

## 2012-02-03 MED ORDER — METHOCARBAMOL 500 MG PO TABS: 500.0000 mg | ORAL_TABLET | Freq: Three times a day (TID) | ORAL | Status: DC | PRN

## 2012-02-03 MED ORDER — ACETAMINOPHEN 10 MG/ML IV SOLN
INTRAVENOUS | Status: AC
  Filled 2012-02-03: qty 100

## 2012-02-03 MED ORDER — HYDROMORPHONE HCL 4 MG PO TABS: 4.0000 mg | ORAL_TABLET | ORAL | Status: DC | PRN

## 2012-02-03 MED ORDER — PROPOFOL 10 MG/ML IV BOLUS
INTRAVENOUS | Status: DC | PRN
  Administered 2012-02-03: 200 mg via INTRAVENOUS

## 2012-02-03 MED ORDER — ACETAMINOPHEN 325 MG PO TABS: 650.0000 mg | ORAL_TABLET | Freq: Four times a day (QID) | ORAL | Status: DC | PRN

## 2012-02-03 MED ORDER — ENOXAPARIN SODIUM 30 MG/0.3ML ~~LOC~~ SOLN
30.0000 mg | SUBCUTANEOUS | Status: DC
  Administered 2012-02-04: 30 mg via SUBCUTANEOUS
  Filled 2012-02-03 (×2): qty 0.3

## 2012-02-03 MED ORDER — ONDANSETRON HCL 4 MG PO TABS: 4.0000 mg | ORAL_TABLET | Freq: Four times a day (QID) | ORAL | Status: DC | PRN

## 2012-02-03 MED ORDER — ACETAMINOPHEN 650 MG RE SUPP: 650.0000 mg | Freq: Four times a day (QID) | RECTAL | Status: DC | PRN

## 2012-02-03 MED ORDER — METHOCARBAMOL 100 MG/ML IJ SOLN
500.0000 mg | Freq: Four times a day (QID) | INTRAVENOUS | Status: DC | PRN
  Administered 2012-02-03: 500 mg via INTRAVENOUS
  Filled 2012-02-03: qty 5

## 2012-02-03 MED ORDER — LACTATED RINGERS IV SOLN
INTRAVENOUS | Status: DC
  Administered 2012-02-03: 14:00:00 via INTRAVENOUS
  Administered 2012-02-03: 1000 mL via INTRAVENOUS

## 2012-02-03 MED ORDER — CEFAZOLIN SODIUM-DEXTROSE 2-3 GM-% IV SOLR
INTRAVENOUS | Status: AC
  Filled 2012-02-03: qty 50

## 2012-02-03 MED ORDER — OXYCODONE HCL 5 MG/5ML PO SOLN
5.0000 mg | Freq: Once | ORAL | Status: DC | PRN
  Filled 2012-02-03: qty 5

## 2012-02-03 MED ORDER — EPINEPHRINE HCL 1 MG/ML IJ SOLN
INTRAMUSCULAR | Status: AC
  Filled 2012-02-03: qty 2

## 2012-02-03 MED ORDER — PHENOL 1.4 % MT LIQD: 1.0000 | OROMUCOSAL | Status: DC | PRN

## 2012-02-03 MED ORDER — METOCLOPRAMIDE HCL 5 MG/ML IJ SOLN: 5.0000 mg | Freq: Three times a day (TID) | INTRAMUSCULAR | Status: DC | PRN

## 2012-02-03 MED ORDER — LACTATED RINGERS IR SOLN
Status: DC | PRN
  Administered 2012-02-03: 6000 mL

## 2012-02-03 MED ORDER — METHOCARBAMOL 500 MG PO TABS
500.0000 mg | ORAL_TABLET | Freq: Four times a day (QID) | ORAL | Status: DC | PRN
  Administered 2012-02-03 – 2012-02-04 (×2): 500 mg via ORAL
  Filled 2012-02-03 (×2): qty 1

## 2012-02-03 MED ORDER — ROPIVACAINE HCL 5 MG/ML IJ SOLN
INTRAMUSCULAR | Status: DC | PRN
  Administered 2012-02-03: 30 mL

## 2012-02-03 MED ORDER — ONDANSETRON HCL 4 MG/2ML IJ SOLN
INTRAMUSCULAR | Status: DC | PRN
  Administered 2012-02-03: 4 mg via INTRAVENOUS

## 2012-02-03 MED ORDER — PROMETHAZINE HCL 25 MG/ML IJ SOLN: 6.2500 mg | INTRAMUSCULAR | Status: DC | PRN

## 2012-02-03 MED ORDER — MENTHOL 3 MG MT LOZG: 1.0000 | LOZENGE | OROMUCOSAL | Status: DC | PRN

## 2012-02-03 MED ORDER — METOCLOPRAMIDE HCL 10 MG PO TABS: 5.0000 mg | ORAL_TABLET | Freq: Three times a day (TID) | ORAL | Status: DC | PRN

## 2012-02-03 MED ORDER — ACETAMINOPHEN 10 MG/ML IV SOLN
INTRAVENOUS | Status: DC | PRN
  Administered 2012-02-03: 1000 mg via INTRAVENOUS

## 2012-02-03 MED ORDER — METHOCARBAMOL 500 MG PO TABS
500.0000 mg | ORAL_TABLET | Freq: Four times a day (QID) | ORAL | Status: DC | PRN
Start: 2012-02-03 — End: 2012-02-03

## 2012-02-03 MED ORDER — KETOROLAC TROMETHAMINE 10 MG PO TABS: 10.0000 mg | ORAL_TABLET | Freq: Four times a day (QID) | ORAL | Status: DC | PRN

## 2012-02-03 MED ORDER — DOCUSATE SODIUM 100 MG PO CAPS
100.0000 mg | ORAL_CAPSULE | Freq: Two times a day (BID) | ORAL | Status: DC
  Administered 2012-02-04: 100 mg via ORAL

## 2012-02-03 MED ORDER — BUPIVACAINE-EPINEPHRINE (PF) 0.5% -1:200000 IJ SOLN
INTRAMUSCULAR | Status: AC
  Filled 2012-02-03: qty 10

## 2012-02-03 MED ORDER — ONDANSETRON HCL 4 MG/2ML IJ SOLN: 4.0000 mg | Freq: Four times a day (QID) | INTRAMUSCULAR | Status: DC | PRN

## 2012-02-03 MED ORDER — FENTANYL CITRATE 0.05 MG/ML IJ SOLN
INTRAMUSCULAR | Status: DC | PRN
  Administered 2012-02-03 (×3): 50 ug via INTRAVENOUS

## 2012-02-03 MED ORDER — LIDOCAINE HCL 1 % IJ SOLN
INTRAMUSCULAR | Status: DC | PRN
  Administered 2012-02-03: 50 mg via INTRADERMAL

## 2012-02-03 MED ORDER — SODIUM CHLORIDE 0.45 % IV SOLN
INTRAVENOUS | Status: DC
  Administered 2012-02-03: 20:00:00 via INTRAVENOUS

## 2012-02-03 MED ORDER — OXYCODONE HCL 5 MG PO TABS: 5.0000 mg | ORAL_TABLET | Freq: Once | ORAL | Status: DC | PRN

## 2012-02-03 MED ORDER — HYDROMORPHONE HCL 2 MG PO TABS
2.0000 mg | ORAL_TABLET | ORAL | Status: DC | PRN
  Administered 2012-02-04 (×2): 2 mg via ORAL
  Filled 2012-02-03 (×2): qty 1

## 2012-02-03 MED ORDER — KETOROLAC TROMETHAMINE 10 MG PO TABS
10.0000 mg | ORAL_TABLET | Freq: Four times a day (QID) | ORAL | Status: DC | PRN
  Filled 2012-02-03: qty 1

## 2012-02-03 MED ORDER — BUPIVACAINE-EPINEPHRINE 0.5% -1:200000 IJ SOLN
INTRAMUSCULAR | Status: DC | PRN
  Administered 2012-02-03: 25 mL

## 2012-02-03 MED ORDER — HYDROMORPHONE HCL PF 1 MG/ML IJ SOLN: 0.2500 mg | INTRAMUSCULAR | Status: DC | PRN

## 2012-02-03 SURGICAL SUPPLY — 21 items
BLADE 4.2CUDA (BLADE) ×2 IMPLANT
BLADE CUDA SHAVER 3.5 (BLADE) ×2 IMPLANT
CANNULA ACUFO 5X76 (CANNULA) ×2 IMPLANT
CLOTH BEACON ORANGE TIMEOUT ST (SAFETY) ×2 IMPLANT
DRSG EMULSION OIL 3X3 NADH (GAUZE/BANDAGES/DRESSINGS) ×2 IMPLANT
DURAPREP 26ML APPLICATOR (WOUND CARE) ×2 IMPLANT
GLOVE BIOGEL PI IND STRL 7.5 (GLOVE) IMPLANT
GLOVE BIOGEL PI IND STRL 8 (GLOVE) ×1 IMPLANT
GLOVE BIOGEL PI INDICATOR 7.5 (GLOVE)
GLOVE BIOGEL PI INDICATOR 8 (GLOVE) ×1
GLOVE SURG SS PI 7.5 STRL IVOR (GLOVE) IMPLANT
GLOVE SURG SS PI 8.0 STRL IVOR (GLOVE) ×12 IMPLANT
GOWN PREVENTION PLUS LG XLONG (DISPOSABLE) ×2 IMPLANT
GOWN STRL REIN XL XLG (GOWN DISPOSABLE) ×4 IMPLANT
MANIFOLD NEPTUNE II (INSTRUMENTS) ×2 IMPLANT
PACK ARTHROSCOPY WL (CUSTOM PROCEDURE TRAY) ×2 IMPLANT
PADDING CAST COTTON 6X4 STRL (CAST SUPPLIES) ×2 IMPLANT
SET ARTHROSCOPY TUBING (MISCELLANEOUS) ×1
SET ARTHROSCOPY TUBING LN (MISCELLANEOUS) ×1 IMPLANT
SUT ETHILON 4 0 PS 2 18 (SUTURE) ×2 IMPLANT
WRAP KNEE MAXI GEL POST OP (GAUZE/BANDAGES/DRESSINGS) ×2 IMPLANT

## 2012-02-03 NOTE — Anesthesia Procedure Notes (Addendum)
Anesthesia Regional Block:   Narrative:    Anesthesia Regional Block:  Femoral nerve block  Pre-Anesthetic Checklist: ,, timeout performed, Correct Patient, Correct Site, Correct Laterality, Correct Procedure, Correct Position, site marked, Risks and benefits discussed, Surgical consent,  Pre-op evaluation,  Post-op pain management  Laterality: Right  Prep: chloraprep       Needles:  Injection technique: Single-shot  Needle Type: Stimiplex      Needle Gauge: 20 and 20 G    Additional Needles:  Procedures: ultrasound guided (picture in chart) and nerve stimulator Femoral nerve block  Nerve Stimulator or Paresthesia:  Response: Quad, 0.5 mA,   Additional Responses:   Narrative:  Start time: 02/03/2012 12:10 PM End time: 02/03/2012 12:20 PM Injection made incrementally with aspirations every 5 mL.  Performed by: Personally  Anesthesiologist: Lissa Hoard

## 2012-02-03 NOTE — Anesthesia Postprocedure Evaluation (Signed)
Anesthesia Post Note  Patient: Sarah Cobb  Procedure(s) Performed: Procedure(s) (LRB): ARTHROSCOPY KNEE (Right)  Anesthesia type: General  Patient location: PACU  Post pain: Pain level controlled  Post assessment: Post-op Vital signs reviewed  Last Vitals: BP 122/76  Pulse 77  Temp 36.6 C (Oral)  Resp 14  SpO2 100%  LMP 01/22/2012  Post vital signs: Reviewed  Level of consciousness: sedated  Complications: No apparent anesthesia complications

## 2012-02-03 NOTE — Transfer of Care (Signed)
Immediate Anesthesia Transfer of Care Note  Patient: Sarah Cobb  Procedure(s) Performed: Procedure(s) (LRB) with comments: ARTHROSCOPY KNEE (Right) - Right Knee Arthroscopy with Manipulation Under Anesthesia/Evaluation Under Anesthesia/Debridement  Patient Location: PACU  Anesthesia Type:General  Level of Consciousness: oriented, sedated and patient cooperative  Airway & Oxygen Therapy: Patient Spontanous Breathing and Patient connected to face mask oxygen  Post-op Assessment: Report given to PACU RN, Post -op Vital signs reviewed and stable and Patient moving all extremities  Post vital signs: Reviewed and stable  Complications: No apparent anesthesia complications

## 2012-02-03 NOTE — Anesthesia Preprocedure Evaluation (Addendum)
Anesthesia Evaluation  Patient identified by MRN, date of birth, ID band Patient awake    Reviewed: Allergy & Precautions, H&P , NPO status , Patient's Chart, lab work & pertinent test results  Airway Mallampati: II TM Distance: <3 FB Neck ROM: Full    Dental No notable dental hx. (+) Dental Advisory Given   Pulmonary neg pulmonary ROS,  breath sounds clear to auscultation  Pulmonary exam normal       Cardiovascular - dysrhythmias + Valvular Problems/Murmurs MR Rhythm:Regular Rate:Normal     Neuro/Psych PSYCHIATRIC DISORDERS Depression negative neurological ROS     GI/Hepatic negative GI ROS, Neg liver ROS,   Endo/Other  negative endocrine ROS  Renal/GU negative Renal ROS     Musculoskeletal negative musculoskeletal ROS (+)   Abdominal   Peds  Hematology negative hematology ROS (+)   Anesthesia Other Findings   Reproductive/Obstetrics negative OB ROS                          Anesthesia Physical  Anesthesia Plan  ASA: II  Anesthesia Plan: General   Post-op Pain Management:    Induction: Intravenous  Airway Management Planned: LMA  Additional Equipment:   Intra-op Plan:   Post-operative Plan: Extubation in OR  Informed Consent: I have reviewed the patients History and Physical, chart, labs and discussed the procedure including the risks, benefits and alternatives for the proposed anesthesia with the patient or authorized representative who has indicated his/her understanding and acceptance.   Dental advisory given  Plan Discussed with: CRNA and Surgeon  Anesthesia Plan Comments:         Anesthesia Quick Evaluation

## 2012-02-03 NOTE — H&P (Signed)
Sarah Cobb is an 42 y.o. female.   Chief Complaint: right knee pain HPI: S/P patella fx ORIF with knee contracture.  Past Medical History  Diagnosis Date  . Depression   . Arrhythmia     Mitral valve regurgitation per pt- OV Dr Chancy Milroy, cardio 6/13- states had eccho and EKG  . Knee pain, right     arthrofibrosis rt knee  . Rosacea   . History of kidney stones     Past Surgical History  Procedure Date  . Wisdom tooth extraction   . Cryotherapy     CERVIX  . Colonoscopy   . Orif patella 07/22/2011    Procedure: OPEN REDUCTION INTERNAL (ORIF) FIXATION PATELLA;  Surgeon: Johnn Hai, MD;  Location: [facility];  Service: Orthopedics;  Laterality: Right;    Family History  Problem Relation Age of Onset  . Adopted: Yes   Social History:  reports that she quit smoking about 13 years ago. Her smoking use included Cigarettes. She has never used smokeless tobacco. She reports that she does not drink alcohol or use illicit drugs.  Allergies:  Allergies  Allergen Reactions  . Valium Palpitations    MAKES HEART RACE    Medications Prior to Admission  Medication Sig Dispense Refill  . aspirin EC 81 MG tablet Take 81 mg by mouth daily.      Marland Kitchen ibuprofen (ADVIL,MOTRIN) 200 MG tablet Take 400 mg by mouth every 6 (six) hours as needed. Pain      . HYDROmorphone (DILAUDID) 2 MG tablet Take 2 mg by mouth every 4 (four) hours as needed. pain      . Ibuprofen-Diphenhydramine Cit (ADVIL PM PO) Take 1 tablet by mouth at bedtime as needed. Pain/sleep      . ketorolac (TORADOL) 10 MG tablet Take 10 mg by mouth every 6 (six) hours as needed. Pain-patient stopped taking this a week ago because she is not taking PT  At this time      . methocarbamol (ROBAXIN) 500 MG tablet Take 500 mg by mouth every 6 (six) hours as needed. Muscle spasm      . Pediatric Multiple Vit-C-FA (FLINSTONES GUMMIES OMEGA-3 DHA PO) Take 1 tablet by mouth daily.      . traMADol (ULTRAM) 50 MG tablet Take 1 tablet (50 mg total)  by mouth every 6 (six) hours as needed for pain.  15 tablet  0    No results found for this or any previous visit (from the past 48 hour(s)). No results found.  Review of Systems  Musculoskeletal: Positive for joint pain.  All other systems reviewed and are negative.    Blood pressure 126/81, pulse 90, temperature 98.9 F (37.2 C), temperature source Oral, resp. rate 18, last menstrual period 01/22/2012, SpO2 98.00%. Physical Exam  Constitutional: She is oriented to person, place, and time. She appears well-developed.  HENT:  Head: Normocephalic.  Eyes: Pupils are equal, round, and reactive to light.  Neck: Normal range of motion.  Cardiovascular: Normal rate.   Respiratory: Effort normal.  GI: Soft.  Musculoskeletal: She exhibits tenderness.       ROM 10-70. No DVT. Decreased sensation.  Neurological: She is alert and oriented to person, place, and time.  Skin: Skin is warm and dry.  Psychiatric: She has a normal mood and affect.     Assessment/Plan S/P ORIF patella fx with arthrofibrosis. Plan MUA EUA arthroscopy. Risks benefits discussed.  Stacie Templin C 02/03/2012, 12:19 PM

## 2012-02-03 NOTE — Brief Op Note (Signed)
02/03/2012  2:00 PM  PATIENT:  Sarah Cobb  42 y.o. female  PRE-OPERATIVE DIAGNOSIS:  Arthrofibrosis Right knee  POST-OPERATIVE DIAGNOSIS:  arthrofibrosis right knee  PROCEDURE:  Procedure(s) (LRB) with comments: ARTHROSCOPY KNEE (Right) - Right Knee Arthroscopy with Manipulation Under Anesthesia/Evaluation Under Anesthesia/Debridement  SURGEON:  Surgeon(s) and Role:    * Johnn Hai, MD - Primary  PHYSICIAN ASSISTANT:   ASSISTANTS: Bissell   ANESTHESIA:   general  EBL:  Total I/O In: 1000 [I.V.:1000] Out: -   BLOOD ADMINISTERED:none  DRAINS: none   LOCAL MEDICATIONS USED:  MARCAINE     SPECIMEN:  Source of Specimen:  knee  DISPOSITION OF SPECIMEN:  PATHOLOGY  COUNTS:  YES  TOURNIQUET:  * No tourniquets in log *  DICTATION:  [id number] PLAN OF CARE: Admit for overnight observation  PATIENT DISPOSITION:  PACU - hemodynamically stable.   Delay start of Pharmacological VTE agent (>24hrs) due to surgical blood loss or risk of bleeding: no

## 2012-02-03 NOTE — Op Note (Signed)
NAMECHRISTAIN, Sarah Cobb              ACCOUNT NO.:  [id number]  MEDICAL RECORD NO.:  [id number]  LOCATION:  4403                         FACILITY:  [facility]  PHYSICIAN:  Susa Day, M.D.    DATE OF BIRTH:  05-03-1970  DATE OF PROCEDURE: DATE OF DISCHARGE:                              OPERATIVE REPORT   PREOPERATIVE DIAGNOSIS:  Arthrofibrosis of the right knee, status post open reduction and internal fixation of the patella.  POSTOPERATIVE DIAGNOSIS:  Arthrofibrosis of the right knee, status post open reduction and internal fixation of the patella.  PROCEDURE PERFORMED: 1. Right knee arthroscopy. 2. Lysis of adhesions with a quadroplasty, removal loose body,     debridement, synovectomy.  ANESTHESIA:  General.  ASSISTANT:  Lacie Draft, PA, was there for holding the leg, distraction of the joint space to allow access and operation of the inflow outflow fluid.  HISTORY:  This is a 42 year old status post patella fracture, comminuted healed fracture, had arthrofibrosis postoperatively maximizing conservative treatment and therapy from 10-70 degrees indicated for arthroscopy manipulation, evaluation of the chondral surfaces of the patella and debridement to increase the range of motion.  Risks and benefits discussed including bleeding, infection, damage to neurovascular structure, DVT, PE, anesthetic complications, etc.  TECHNIQUE:  With the patient in supine position, after induction of adequate general anesthesia, 2 g Kefzol, the right lower extremity was prepped and draped in usual sterile fashion.  Range of motion was 10-70. Routine full extension by a gentle manipulation.  Flexion was still locked.  Therefore, proceeded with arthroscopic release prior to manipulation.  We flexed the knee, and made a lateral parapatellar portal Ace, made a superior medial and superior lateral parapatellar portal through the skin only.  Ingress cannula atraumatically placed. Irrigant was  utilized to insufflate the joint.  I then placed a switching stick within the joint and lysed adhesions along the lateral gutter, medial gutter, and beneath the quadriceps tendon, between the tendon, the scar tissue in the anterior aspect of the femur.  I also released the adhesions of the patella ligament, the proximal tibia, preserving the attachment of the patellar ligament.  This was performed from multiple portals.  Following this, I was able to gain flexion distal 20 degrees of flexion and then gentle manipulation to 110 degrees of flexion.  The quadriceps tendon was intact following this, as was the patellar ligament.  Then I introduced the arthroscopic camera and the shaver and performed a full synovectomy and debridement.  The suprapatellar pouch, the gutters, and medial lateral compartment, and lateral compartment was tight, was unable to enter the medial compartment.  The condyles in the meniscus was unremarkable.  ACL was unremarkable.  There was a portion of the lateral femoral condyle which showed a 1 x 2 cm chondral lesion, grade 4.  This was applied in the lateral gutter noted and this was retrieved with a pituitary sent to Pathology.  There was good patellofemoral tracking.  There was a grade 4 lesion in the central part of the patella consistent with where fracture was.  But actually, the sulcus looked fine.  Then after debridement and lavage again, we had better patellar mobility and flexion as well as  extension, therefore all instrumentation was removed.  Portals were closed with 4-0 nylon simple sutures, copious of Marcaine with epinephrine was infiltrated in the joint and was dressed sterilely, awoken without difficulty and transported to recovery room in satisfactory condition.  The patient tolerated the procedure well with no complications, assistant, Lacie Draft, minimal blood loss.  No tourniquet.     Susa Day, M.D.     Geralynn Rile  D:  02/03/2012   T:  02/03/2012  Job:  [id number]

## 2012-02-04 ENCOUNTER — Encounter (HOSPITAL_COMMUNITY): Payer: Self-pay | Admitting: Specialist

## 2012-02-04 NOTE — Evaluation (Signed)
Occupational Therapy Evaluation Patient Details Name: Sarah Cobb MRN: [id number] DOB: 05/31/1970 Today's Date: 02/04/2012 Time: 2831-5176 OT Time Calculation (min): 15 min  OT Assessment / Plan / Recommendation Clinical Impression  41 yo female s/p R knee arthroscopy, lysis of adhesions with quadroplasty, synovectomy.  All education was completed.  Pt does not need any further OT at this time.      OT Assessment  Patient does not need any further OT services    Follow Up Recommendations  No OT follow up    Barriers to Discharge      Equipment Recommendations  None recommended by OT    Recommendations for Other Services    Frequency       Precautions / Restrictions Precautions Precautions: Fall Required Braces or Orthoses: Knee Immobilizer - Right Knee Immobilizer - Right: On when out of bed or walking Restrictions RLE Weight Bearing: Weight bearing as tolerated   Pertinent Vitals/Pain RLE painful with weight bearing.  Pt repositioned in bed and left with PT for exercises.  Pt was premedicated.      ADL  Toilet Transfer: Min Psychiatric nurse Method: Sit to stand (bed to chair after walking) Transfers/Ambulation Related to ADLs: cotx with PT as pt passed out earlier.  Pt performed one stair with PT:  educated that this procedure is same she will do for shower.  She will sponge bathe initially ADL Comments: Husband will help with adls.  Pt needs overall min A for adls:  she does not have AE and is not interested in this.      OT Diagnosis:    OT Problem List:   OT Treatment Interventions:     OT Goals    Visit Information  Last OT Received On: 02/04/12 Assistance Needed: +1    Subjective Data  Subjective: I'll probably stay downstairs and use that  Patient Stated Goal: none stated   Prior Functioning     Home Living Lives With: Spouse Home Layout: Two level;Bed/bath upstairs;Able to live on main level with bedroom/bathroom Bathroom Shower/Tub:  Walk-in shower (upstairs) Bathroom Toilet: Standard (has 3:1 downstairs) Home Adaptive Equipment: Walker - rolling;Shower chair with back;Wheelchair - manual;Bedside commode/3-in-1 Prior Function Level of Independence: Independent with assistive device(s) Communication Communication: No difficulties         Vision/Perception     Cognition  Overall Cognitive Status: Appears within functional limits for tasks assessed/performed Arousal/Alertness: Awake/alert Orientation Level: Appears intact for tasks assessed Behavior During Session: [facility] for tasks performed    Extremity/Trunk Assessment Right Upper Extremity Assessment RUE ROM/Strength/Tone: [facility] for tasks assessed Left Upper Extremity Assessment LUE ROM/Strength/Tone: [facility] for tasks assessed     Mobility       Shoulder Instructions     Exercise     Balance     End of Session OT - End of Session Activity Tolerance: Patient tolerated treatment well Patient left: in bed;with call bell/phone within reach;with family/visitor present  [place] 02/04/2012, 3:57 PM Lesle Chris, OTR/L [phone] 02/04/2012

## 2012-02-04 NOTE — Evaluation (Signed)
Physical Therapy Evaluation Patient Details Name: Sarah Cobb MRN: [id number] DOB: March 13, 1970 Today's Date: 02/04/2012 Time: 5427-0623 PT Time Calculation (min): 24 min  PT Assessment / Plan / Recommendation Clinical Impression  42 yo female s/p R knee arthroscopy, lysis of adhesions with quadroplasty, synovectomy. Did not range knee during assessment due to unsure of AROM limitations (noted that pt is on CPM for 0-60 degrees). No KI in room and pt has already been up to bathroom. On eval, pt was Min assist for R LE assist and Min-guard for standing, ambulation. While ambulating, pt c/o feeling dizzy. Had pt return to room. As pt was backing up to recliner, pt had a syncopal episode with collapse. Therapist safely assisted pt onto recliner. BP 86/53 initially; 99/63 after 3-4 minutes. RN notified and came to room to assess pt. Deferred any further mobility at this time. Will return for a 2nd session later this afternoon to assess gait/ambulation safety prior to d/c.     PT Assessment  Patient needs continued PT services    Follow Up Recommendations  Home health PT;Supervision for mobility/OOB vs Outpatient PT    Does the patient have the potential to tolerate intense rehabilitation      Barriers to Discharge        Equipment Recommendations  None recommended by PT    Recommendations for Other Services     Frequency Min 5X/week    Precautions / Restrictions Precautions Precautions: Fall Required Braces or Orthoses: Knee Immobilizer - Right Knee Immobilizer - Right: On when out of bed or walking (no KI in room and pt has already been up) Restrictions Weight Bearing Restrictions: No RLE Weight Bearing: Weight bearing as tolerated   Pertinent Vitals/Pain 3/10 at rest; 4/10 with ambulation      Mobility  Bed Mobility Bed Mobility: Supine to Sit Supine to Sit: 4: Min assist Details for Bed Mobility Assistance: Assist for R LE off bed. Transfers Transfers: Sit to Stand;Stand  to Sit Sit to Stand: 4: Min guard;From bed Stand to Sit: To chair/3-in-1;3: Mod assist Details for Transfer Assistance: VCs safety, technique, hand placement. Increased assist to sit due to pt passing out. Therapist safely assisted pt to chair and reclined pt.  Ambulation/Gait Ambulation/Gait Assistance: 4: Min guard Ambulation Distance (Feet): 10 Feet Assistive device: Rolling walker Ambulation/Gait Assistance Details: VCs safety, technique, sequence. No KI in room. Pt/husband reported pt has already ambulated to room x2. After about 5-6 feet pt began to c/o dizziness. Had pt turn and return to room. As pt was backing up to recliner, pt passed out for ~2-3 seconds.  Gait Pattern: Step-to pattern;Decreased stride length;Antalgic    Shoulder Instructions     Exercises     PT Diagnosis: Difficulty walking;Abnormality of gait;Acute pain  PT Problem List: Decreased strength;Decreased range of motion;Decreased activity tolerance;Decreased mobility;Pain;Decreased knowledge of use of DME PT Treatment Interventions: DME instruction;Gait training;Stair training;Functional mobility training;Therapeutic activities;Therapeutic exercise;Patient/family education   PT Goals Acute Rehab PT Goals PT Goal Formulation: With patient/family Time For Goal Achievement: 02/11/12 Potential to Achieve Goals: Good Pt will go Supine/Side to Sit: with supervision PT Goal: Supine/Side to Sit - Progress: Goal set today Pt will go Sit to Supine/Side: with supervision PT Goal: Sit to Supine/Side - Progress: Goal set today Pt will go Sit to Stand: with supervision PT Goal: Sit to Stand - Progress: Goal set today Pt will Ambulate: 51 - 150 feet;with supervision;with rolling walker PT Goal: Ambulate - Progress: Goal set today Pt will Go  Up / Down Stairs: 1-2 stairs;with min assist;with rolling walker PT Goal: Up/Down Stairs - Progress: Goal set today  Visit Information  Last PT Received On: 02/04/12 Assistance  Needed: +1    Subjective Data  Subjective: "Its a three right now, but I'm sure it'll be worse than that when I get up" Patient Stated Goal: Less pain. Improve ROM   Prior Functioning  Home Living Lives With: Spouse Available Help at Discharge: Family Type of Home: House Home Access: Stairs to enter [employer] of Steps: 1 Entrance Stairs-Rails: None Home Layout: Two level;Bed/bath upstairs;Able to live on main level with bedroom/bathroom Home Adaptive Equipment: Walker - rolling;Shower chair with back;Wheelchair - manual Prior Function Level of Independence: Independent with assistive device(s) Able to Take Stairs?: Yes Driving: No Communication Communication: No difficulties    Cognition  Overall Cognitive Status: Appears within functional limits for tasks assessed/performed Arousal/Alertness: Awake/alert Orientation Level: Appears intact for tasks assessed Behavior During Session: [facility] for tasks performed    Extremity/Trunk Assessment Right Lower Extremity Assessment RLE ROM/Strength/Tone: Deficits;Unable to fully assess;Due to pain RLE ROM/Strength/Tone Deficits: Did not officially range R knee, but observed at least 10-15 degrees from full extension.  Left Lower Extremity Assessment LLE ROM/Strength/Tone: [facility] for tasks assessed Trunk Assessment Trunk Assessment: Normal   Balance    End of Session PT - End of Session Activity Tolerance: Other (comment) (Limited by dizzness, syncope) Patient left: in chair;with call bell/phone within reach;with family/visitor present CPM Right Knee CPM Right Knee: Off  GP Functional Assessment Tool Used: clinical judgement Functional Limitation: Mobility: Walking and moving around Mobility: Walking and Moving Around Current Status [phone]): At least 20 percent but less than 40 percent impaired, limited or restricted Mobility: Walking and Moving Around Goal Status [phone]): At least 1 percent but less than 20 percent impaired,  limited or restricted   Weston Anna [facility] 02/04/2012, 12:04 PM [phone]

## 2012-02-04 NOTE — Progress Notes (Signed)
Subjective: 1 Day Post-Op Procedure(s) (LRB): ARTHROSCOPY KNEE (Right) Patient reports pain as moderate at the right knee. Denies calf pain. Yesterday noted some upper abdominal discomfort which has now resolved. No N/V.  Objective: Vital signs in last 24 hours: Temp:  [97.6 F (36.4 C)-98.9 F (37.2 C)] 98 F (36.7 C) (01/31 0616) Pulse Rate:  [60-95] 70  (01/31 0616) Resp:  [14-22] 16  (01/31 0616) BP: (98-126)/(64-83) 106/72 mmHg (01/31 0616) SpO2:  [94 %-100 %] 97 % (01/31 0616) Weight:  [65.772 kg (145 lb)] 65.772 kg (145 lb) (01/30 1515)  Intake/Output from previous day: 01/30 0701 - 01/31 0700 In: 2495 [P.O.:240; I.V.:2200; IV Piggyback:55] Out: 2230 [Urine:2225; Blood:5] Intake/Output this shift:     Basename 02/03/12 1600  HGB 12.5    Basename 02/03/12 1600  WBC 13.9*  RBC 3.99  HCT 36.7  PLT 234    Basename 02/03/12 1600  NA --  K --  CL --  CO2 --  BUN --  CREATININE 0.65  GLUCOSE --  CALCIUM --   No results found for this basename: LABPT:2,INR:2 in the last 72 hours  Neurologically intact Neurovascular intact Sensation intact distally Intact pulses distally Dorsiflexion/Plantar flexion intact Incision: dressing C/D/I and no drainage No cellulitis present Compartment soft No calf pain. Negative Homan's. No sign of DVT  Assessment/Plan: 1 Day Post-Op Procedure(s) (LRB): ARTHROSCOPY KNEE (Right) Advance diet Up with therapy D/C IV fluids CPM D/C home later today after PT Will discuss with Dr. Mliss Fritz, [place]. 02/04/2012, 7:45 AM

## 2012-02-04 NOTE — Progress Notes (Signed)
Physical Therapy Treatment Patient Details Name: Sarah Cobb MRN: [id number] DOB: 10/13/1970 Today's Date: 02/04/2012 Time: 1275-1700 PT Time Calculation (min): 27 min  PT Assessment / Plan / Recommendation Comments on Treatment Session  2nd session. Able to ambulate with RW. No LOB, dizziness. Initiated exercises and encourged pt to perform again tonight, especially knee flexion (preferably in seated position if able). Practiced 1 step. No further questions from pt/husband. Pt plans to have HHPT initially.     Follow Up Recommendations  Home health PT;Supervision for mobility/OOB     Does the patient have the potential to tolerate intense rehabilitation     Barriers to Discharge        Equipment Recommendations  None recommended by PT    Recommendations for Other Services    Frequency Min 5X/week   Plan Discharge plan remains appropriate    Precautions / Restrictions Precautions Precautions: Fall Required Braces or Orthoses:  (spoke with PA-no KI needed) Knee Immobilizer - Right: On when out of bed or walking Restrictions Weight Bearing Restrictions: No RLE Weight Bearing: Weight bearing as tolerated   Pertinent Vitals/Pain 6/10 R knee    Mobility  Bed Mobility Bed Mobility: Supine to Sit;Sit to Supine Supine to Sit: 4: Min assist Sit to Supine: 4: Min assist Details for Bed Mobility Assistance: Assist for R LE off bed Transfers Transfers: Sit to Stand;Stand to Sit Sit to Stand: 4: Min guard;From bed Stand to Sit: 4: Min guard;To bed Details for Transfer Assistance: VCs safety, technique, hand placement Ambulation/Gait Ambulation/Gait Assistance: 4: Min guard Ambulation Distance (Feet): 60 Feet (x2) Assistive device: Rolling walker Ambulation/Gait Assistance Details: VCS for R foot flat during stance. Pt limiting WBing on R LE quite a bit. Slow gait speed. Pt denied dizziness. followed with recliner for safety.  Gait Pattern: Step-to pattern;Antalgic;Decreased  stance time - right;Decreased stride length;Decreased step length - right Stairs: Yes Stairs Assistance: 4: Min guard Stairs Assistance Details (indicate cue type and reason): VCs safety, technique, sequence.  Stair Management Technique: Backwards;With walker Number of Stairs: 1     Exercises Goniometric ROM: Knee ROM ~ 10-30 degrees supine General Exercises - Lower Extremity Ankle Circles/Pumps: AROM;Right;10 reps;Supine Quad Sets: AROM;Right;5 reps;Supine Heel Slides: AAROM;Right;10 reps;Supine-limited effort from pt. ? Due to pain? Hip ABduction/ADduction: AROM;Right;10 reps;Supine Straight Leg Raises: AROM;Right;10 reps;AAROM;Supine   PT Diagnosis:    PT Problem List:   PT Treatment Interventions:     PT Goals Acute Rehab PT Goals Pt will go Supine/Side to Sit: with supervision PT Goal: Supine/Side to Sit - Progress: Progressing toward goal Pt will go Sit to Supine/Side: with supervision PT Goal: Sit to Supine/Side - Progress: Progressing toward goal Pt will go Sit to Stand: with supervision PT Goal: Sit to Stand - Progress: Progressing toward goal Pt will Ambulate: 51 - 150 feet;with supervision;with rolling walker PT Goal: Ambulate - Progress: Progressing toward goal Pt will Go Up / Down Stairs: 1-2 stairs;with min assist;with rolling walker PT Goal: Up/Down Stairs - Progress: Met  Visit Information  Last PT Received On: 02/04/12 Assistance Needed: +1    Subjective Data  Subjective: "I feel okay" Patient Stated Goal: Less pain. Improve ROM   Cognition  Overall Cognitive Status: Appears within functional limits for tasks assessed/performed Arousal/Alertness: Awake/alert Orientation Level: Appears intact for tasks assessed Behavior During Session: [facility] for tasks performed    Balance     End of Session PT - End of Session Equipment Utilized During Treatment: Gait belt Activity Tolerance: Patient tolerated  treatment well Patient left: in bed;with call bell/phone  within reach;with family/visitor present   GP     Weston Anna [facility] 02/04/2012, 4:51 PM [phone]

## 2012-02-05 NOTE — Discharge Summary (Signed)
Physician Discharge Summary   Patient ID: Sarah Cobb MRN: [id number] DOB/AGE: [birth year] 43 y.o.  Admit date: 02/03/2012 Discharge date: 02/05/2012  Primary Diagnosis:   Arthrofibrosis Right knee  Admission Diagnoses:  Past Medical History  Diagnosis Date  . Depression   . Arrhythmia     Mitral valve regurgitation per pt- OV Dr Chancy Milroy, cardio 6/13- states had eccho and EKG  . Knee pain, right     arthrofibrosis rt knee  . Rosacea   . History of kidney stones    Discharge Diagnoses:   Active Problems:  * No active hospital problems. *   Procedure:  Procedure(s) (LRB): ARTHROSCOPY KNEE (Right)   Consults: None  HPI:  see H&P    Laboratory Data: Hospital Outpatient Visit on 01/26/2012  Component Date Value Range Status  . WBC 01/26/2012 11.3* 4.0 - 10.5 K/uL Final  . RBC 01/26/2012 4.12  3.87 - 5.11 MIL/uL Final  . Hemoglobin 01/26/2012 13.0  12.0 - 15.0 g/dL Final  . HCT 01/26/2012 37.7  36.0 - 46.0 % Final  . MCV 01/26/2012 91.5  78.0 - 100.0 fL Final  . MCH 01/26/2012 31.6  26.0 - 34.0 pg Final  . MCHC 01/26/2012 34.5  30.0 - 36.0 g/dL Final  . RDW 01/26/2012 12.5  11.5 - 15.5 % Final  . Platelets 01/26/2012 302  150 - 400 K/uL Final  . Preg, Serum 01/26/2012 NEGATIVE  NEGATIVE Final  . MRSA, PCR 01/26/2012 NEGATIVE  NEGATIVE Final  . Staphylococcus aureus 01/26/2012 NEGATIVE  NEGATIVE Final   Comment:                                 The Xpert SA Assay (FDA                          approved for NASAL specimens                          in patients over 110 years of age),                          is one component of                          a comprehensive surveillance                          program.  Test performance has                          been validated by [employer] for patients greater                          than or equal to 82 year old.                          It is not intended                          to diagnose  infection nor  to                          guide or monitor treatment.    Basename 02/03/12 1600  HGB 12.5    Basename 02/03/12 1600  WBC 13.9*  RBC 3.99  HCT 36.7  PLT 234    Basename 02/03/12 1600  NA --  K --  CL --  CO2 --  BUN --  CREATININE 0.65  GLUCOSE --  CALCIUM --   No results found for this basename: LABPT:2,INR:2 in the last 72 hours  X-Rays:No results found.  EKG: Orders placed during the hospital encounter of 07/22/11  . EKG 12-LEAD  . EKG 12-LEAD  . EKG     Hospital Course: Patient was admitted to [facility] and taken to the OR and underwent the above state procedure without complications.  Patient tolerated the procedure well and was later transferred to the recovery room and then to the orthopaedic floor for postoperative care.  They were given PO and IV analgesics for pain control following their surgery.  They were given 24 hours of postoperative antibiotics.  CPM initiated post-op. PT was consulted postop to assist with mobility and transfers.  The patient was allowed to be WBAT with therapy. Discharge planning was consulted to help with postop disposition and equipment needs.  Patient had a good night on the evening of surgery and started to get up OOB with therapy on day one. Patient was seen in rounds and was ready to go home on day one following PT. She had a syncopal episode during her morning PT visit but was given an IV fluid bolus and tolerated afternoon PT well.  They were given discharge instructions and dressing directions.  They were instructed on when to follow up in the office with Dr. Tonita Cong.  Discharge Medications: Prior to Admission medications   Medication Sig Start Date End Date Taking? Authorizing Provider  aspirin EC 81 MG tablet Take 81 mg by mouth daily.   Yes Historical Provider, MD  ibuprofen (ADVIL,MOTRIN) 200 MG tablet Take 400 mg by mouth every 6 (six) hours as needed. Pain   Yes Historical Provider, MD  HYDROmorphone  (DILAUDID) 4 MG tablet Take 1 tablet (4 mg total) by mouth every 4 (four) hours as needed for pain. 02/03/12   Johnn Hai, MD  Ibuprofen-Diphenhydramine Cit (ADVIL PM PO) Take 1 tablet by mouth at bedtime as needed. Pain/sleep    Historical Provider, MD  ketorolac (TORADOL) 10 MG tablet Take 1 tablet (10 mg total) by mouth every 6 (six) hours as needed for pain. 02/03/12   Johnn Hai, MD  ketorolac (TORADOL) 10 MG tablet Take 1 tablet (10 mg total) by mouth every 6 (six) hours as needed for pain. 02/03/12   Johnn Hai, MD  methocarbamol (ROBAXIN) 500 MG tablet Take 1 tablet (500 mg total) by mouth 3 (three) times daily between meals as needed. 02/03/12   Johnn Hai, MD  Pediatric Multiple Vit-C-FA (FLINSTONES GUMMIES OMEGA-3 DHA PO) Take 1 tablet by mouth daily.    Historical Provider, MD  traMADol (ULTRAM) 50 MG tablet Take 1 tablet (50 mg total) by mouth every 6 (six) hours as needed for pain. 12/15/11   Linus Mako, PA    Diet: Regular diet Activity:WBAT Follow-up:in 10-14 days Disposition - Home Discharged Condition: good   Discharge Orders    Future Orders Please Complete By Expires   Diet -  low sodium heart healthy      Call MD / Call 911      Comments:   If you experience chest pain or shortness of breath, CALL 911 and be transported to the hospital emergency room.  If you develope a fever above 101 F, pus (white drainage) or increased drainage or redness at the wound, or calf pain, call your surgeon's office.   Constipation Prevention      Comments:   Drink plenty of fluids.  Prune juice may be helpful.  You may use a stool softener, such as Colace (over the counter) 100 mg twice a day.  Use MiraLax (over the counter) for constipation as needed.   Increase activity slowly as tolerated          Medication List     As of 02/05/2012 10:17 PM    TAKE these medications         ADVIL PM PO   Take 1 tablet by mouth at bedtime as needed. Pain/sleep       aspirin EC 81 MG tablet   Take 81 mg by mouth daily.      FLINSTONES GUMMIES OMEGA-3 DHA PO   Take 1 tablet by mouth daily.      HYDROmorphone 4 MG tablet   Commonly known as: DILAUDID   Take 1 tablet (4 mg total) by mouth every 4 (four) hours as needed for pain.      ibuprofen 200 MG tablet   Commonly known as: ADVIL,MOTRIN   Take 400 mg by mouth every 6 (six) hours as needed. Pain      ketorolac 10 MG tablet   Commonly known as: TORADOL   Take 1 tablet (10 mg total) by mouth every 6 (six) hours as needed for pain.      ketorolac 10 MG tablet   Commonly known as: TORADOL   Take 1 tablet (10 mg total) by mouth every 6 (six) hours as needed for pain.      methocarbamol 500 MG tablet   Commonly known as: ROBAXIN   Take 1 tablet (500 mg total) by mouth 3 (three) times daily between meals as needed.      traMADol 50 MG tablet   Commonly known as: ULTRAM   Take 1 tablet (50 mg total) by mouth every 6 (six) hours as needed for pain.           Follow-up Information    Follow up with BEANE,JEFFREY C, MD. In 10 days.   Contact information:   [address] [place] [address] [phone]          Signed: Cecilie Kicks. 02/05/2012, 10:17 PM

## 2012-02-08 NOTE — Progress Notes (Signed)
   02/04/12 1500  OT G-codes **NOT FOR INPATIENT CLASS**  Functional Assessment Tool Used clinical judgment/observation  Functional Limitation Self care  Self Care Current Status (R0475) CJ  Self Care Goal Status (V3917) CJ  Self Care Discharge Status (H2178) CJ  Gcodes from eval on 1/31.  [place], [place] [phone] 02/08/2012

## 2012-02-19 ENCOUNTER — Other Ambulatory Visit: Payer: Self-pay

## 2012-04-05 ENCOUNTER — Other Ambulatory Visit: Payer: Self-pay

## 2012-04-05 DIAGNOSIS — Z1231 Encounter for screening mammogram for malignant neoplasm of breast: Secondary | ICD-10-CM

## 2012-05-01 ENCOUNTER — Ambulatory Visit
Admission: RE | Admit: 2012-05-01 | Discharge: 2012-05-01 | Disposition: A | Discharge status: Home / Self Care | Payer: BC Managed Care – PPO | Source: Ambulatory Visit

## 2012-05-01 DIAGNOSIS — Z1231 Encounter for screening mammogram for malignant neoplasm of breast: Secondary | ICD-10-CM

## 2012-11-09 ENCOUNTER — Other Ambulatory Visit: Payer: Self-pay

## 2013-03-26 ENCOUNTER — Other Ambulatory Visit: Payer: Self-pay

## 2013-03-26 DIAGNOSIS — Z1231 Encounter for screening mammogram for malignant neoplasm of breast: Secondary | ICD-10-CM

## 2013-05-02 ENCOUNTER — Ambulatory Visit
Admission: RE | Admit: 2013-05-02 | Discharge: 2013-05-02 | Disposition: A | Discharge status: Home / Self Care | Payer: BC Managed Care – PPO | Source: Ambulatory Visit

## 2013-05-02 DIAGNOSIS — Z1231 Encounter for screening mammogram for malignant neoplasm of breast: Secondary | ICD-10-CM

## 2013-06-08 ENCOUNTER — Encounter: Payer: Self-pay | Admitting: *Deleted

## 2013-06-08 ENCOUNTER — Telehealth: Payer: Self-pay | Admitting: Gastroenterology

## 2013-06-08 NOTE — Telephone Encounter (Signed)
Patient does not have a preference for new MD. She reports mucous in her stools for 2-3 weeks. Sometimes only has mucous no stool. Denies abdominal pain or cramping. Scheduled with Alonza Bogus, PA on 06/12/13 at 1:30 PM.

## 2013-06-12 ENCOUNTER — Encounter: Payer: Self-pay | Admitting: Gastroenterology

## 2013-06-12 ENCOUNTER — Ambulatory Visit (INDEPENDENT_AMBULATORY_CARE_PROVIDER_SITE_OTHER): Payer: BC Managed Care – PPO | Admitting: Gastroenterology

## 2013-06-12 VITALS — BP 108/80 | HR 68 | Ht 60.0 in | Wt 152.2 lb

## 2013-06-12 DIAGNOSIS — R195 Other fecal abnormalities: Secondary | ICD-10-CM | POA: Insufficient documentation

## 2013-06-12 MED ORDER — HYDROCORTISONE ACETATE 25 MG RE SUPP: 25.0000 mg | Freq: Two times a day (BID) | RECTAL | Status: DC

## 2013-06-12 NOTE — Progress Notes (Signed)
Reviewed and agree.

## 2013-06-12 NOTE — Progress Notes (Signed)
     06/12/2013 Sarah Cobb [id number] 1970-12-09   History of Present Illness:  This is a pleasant 43 year old female who is previously known to Dr. Sharlett Iles in March of 2013 for colonoscopy at which time the study was normal. 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 She presents to our office today with complaints of mucus in her stool for the past 4-5 weeks. She says that it seems to occur every time she has a bowel movement and sometimes she even pass a small amount of mucus without stool. She's having several small soft, but formed bowel movements throughout the day.  She denies any diarrhea, rectal bleeding, or abdominal/rectal pain associated with this.  Within the past two months she has a CBC, CMP, and TSH that were unremarkable.     Current Medications, Allergies, Past Medical History, Past Surgical History, Family History and Social History were reviewed in [employer] record.   Physical Exam: BP 108/80  Pulse 68  Ht 5' (1.524 m)  Wt 152 lb 3.2 oz (69.037 kg)  BMI 29.72 kg/m2  LMP 06/06/2013 General: Well developed white female in no acute distress Head: Normocephalic and atraumatic Eyes:  Sclerae anicteric, conjunctiva pink  Ears: Normal auditory acuity Lungs: Clear throughout to auscultation Heart: Regular rate and rhythm Abdomen: Soft, non-distended.  Normal bowel sounds.  Non-tender. Musculoskeletal: Symmetrical with no gross deformities  Extremities: No edema  Neurological: Alert oriented x 4, grossly non-focal Psychological:  Alert and cooperative. Normal mood and affect  Assessment and Recommendations: -Mucus in stool:  Likely normal physiologic process/hemorrhoidal in etiology.  Normal colonoscopy 03/2011.  Essentially no other complaints.  Will treat with hydrocortisone suppositories BID for 10 days.  Follow-up as needed.

## 2013-06-12 NOTE — Patient Instructions (Signed)
We have sent the following medications to your pharmacy for you to pick up at your convenience: Hydrocortisone Suppositories 25 mg, please take one suppositories twice daily for ten days

## 2013-07-17 ENCOUNTER — Encounter (HOSPITAL_COMMUNITY): Payer: Self-pay | Admitting: Emergency Medicine

## 2013-07-17 ENCOUNTER — Emergency Department (HOSPITAL_COMMUNITY)
Admission: EM | Admit: 2013-07-17 | Discharge: 2013-07-17 | Disposition: A | Discharge status: Home / Self Care | Payer: BC Managed Care – PPO | Attending: Emergency Medicine | Admitting: Emergency Medicine

## 2013-07-17 ENCOUNTER — Emergency Department (HOSPITAL_COMMUNITY): Payer: BC Managed Care – PPO

## 2013-07-17 DIAGNOSIS — F3289 Other specified depressive episodes: Secondary | ICD-10-CM | POA: Insufficient documentation

## 2013-07-17 DIAGNOSIS — Z8739 Personal history of other diseases of the musculoskeletal system and connective tissue: Secondary | ICD-10-CM | POA: Insufficient documentation

## 2013-07-17 DIAGNOSIS — Z87891 Personal history of nicotine dependence: Secondary | ICD-10-CM | POA: Insufficient documentation

## 2013-07-17 DIAGNOSIS — F329 Major depressive disorder, single episode, unspecified: Secondary | ICD-10-CM | POA: Insufficient documentation

## 2013-07-17 DIAGNOSIS — R0789 Other chest pain: Secondary | ICD-10-CM

## 2013-07-17 DIAGNOSIS — R609 Edema, unspecified: Secondary | ICD-10-CM | POA: Insufficient documentation

## 2013-07-17 DIAGNOSIS — Z872 Personal history of diseases of the skin and subcutaneous tissue: Secondary | ICD-10-CM | POA: Insufficient documentation

## 2013-07-17 DIAGNOSIS — Z8679 Personal history of other diseases of the circulatory system: Secondary | ICD-10-CM | POA: Insufficient documentation

## 2013-07-17 DIAGNOSIS — Z87442 Personal history of urinary calculi: Secondary | ICD-10-CM | POA: Insufficient documentation

## 2013-07-17 LAB — CBC WITH DIFFERENTIAL/PLATELET
BASOS ABS: 0 10*3/uL (ref 0.0–0.1)
BASOS PCT: 0 % (ref 0–1)
Eosinophils Absolute: 0.2 10*3/uL (ref 0.0–0.7)
Eosinophils Relative: 2 % (ref 0–5)
HCT: 40.2 % (ref 36.0–46.0)
Hemoglobin: 13.8 g/dL (ref 12.0–15.0)
LYMPHS PCT: 26 % (ref 12–46)
Lymphs Abs: 3 10*3/uL (ref 0.7–4.0)
MCH: 31.6 pg (ref 26.0–34.0)
MCHC: 34.3 g/dL (ref 30.0–36.0)
MCV: 92 fL (ref 78.0–100.0)
MONO ABS: 0.6 10*3/uL (ref 0.1–1.0)
Monocytes Relative: 5 % (ref 3–12)
NEUTROS ABS: 7.8 10*3/uL — AB (ref 1.7–7.7)
NEUTROS PCT: 67 % (ref 43–77)
PLATELETS: 258 10*3/uL (ref 150–400)
RBC: 4.37 MIL/uL (ref 3.87–5.11)
RDW: 12.3 % (ref 11.5–15.5)
WBC: 11.5 10*3/uL — AB (ref 4.0–10.5)

## 2013-07-17 LAB — I-STAT CHEM 8, ED
BUN: 12 mg/dL (ref 6–23)
CALCIUM ION: 1.19 mmol/L (ref 1.12–1.23)
CHLORIDE: 105 meq/L (ref 96–112)
Creatinine, Ser: 0.8 mg/dL (ref 0.50–1.10)
Glucose, Bld: 89 mg/dL (ref 70–99)
HEMATOCRIT: 44 % (ref 36.0–46.0)
Hemoglobin: 15 g/dL (ref 12.0–15.0)
POTASSIUM: 3.4 meq/L — AB (ref 3.7–5.3)
SODIUM: 139 meq/L (ref 137–147)
TCO2: 22 mmol/L (ref 0–100)

## 2013-07-17 LAB — HEPATIC FUNCTION PANEL
ALK PHOS: 51 U/L (ref 39–117)
ALT: 26 U/L (ref 0–35)
AST: 24 U/L (ref 0–37)
Albumin: 4 g/dL (ref 3.5–5.2)
Bilirubin, Direct: <0.2 mg/dL (ref 0.0–0.3)
TOTAL PROTEIN: 7.8 g/dL (ref 6.0–8.3)
Total Bilirubin: 0.4 mg/dL (ref 0.3–1.2)

## 2013-07-17 LAB — LIPASE, BLOOD: LIPASE: 74 U/L — AB (ref 11–59)

## 2013-07-17 LAB — I-STAT TROPONIN, ED
TROPONIN I, POC: 0 ng/mL (ref 0.00–0.08)
TROPONIN I, POC: 0 ng/mL (ref 0.00–0.08)

## 2013-07-17 LAB — D-DIMER, QUANTITATIVE: D-Dimer, Quant: 0.59 ug/mL-FEU — ABNORMAL HIGH (ref 0.00–0.48)

## 2013-07-17 MED ORDER — IOHEXOL 350 MG/ML SOLN
100.0000 mL | Freq: Once | INTRAVENOUS | Status: AC | PRN
  Administered 2013-07-17: 100 mL via INTRAVENOUS

## 2013-07-17 MED ORDER — IOHEXOL 300 MG/ML  SOLN: 100.0000 mL | Freq: Once | INTRAMUSCULAR | Status: DC | PRN

## 2013-07-17 NOTE — Discharge Instructions (Signed)

## 2013-07-17 NOTE — ED Provider Notes (Signed)
CSN: [id number]     Arrival date & time 07/17/13  1660 History   First MD Initiated Contact with Patient 07/17/13 0725     Chief Complaint  Patient presents with  . Chest Pain     (Consider location/radiation/quality/duration/timing/severity/associated sxs/prior Treatment) Patient is a 43 y.o. female presenting with chest pain. The history is provided by the patient.  Chest Pain Associated symptoms: no abdominal pain, no back pain, no headache, no nausea, no numbness, no shortness of breath, not vomiting and no weakness   patient with left chest pain. Dull. Some radiation left arm. No worse with breathing. No worse with movement. No nausea vomiting or freezes. Had some nausea 2 days ago without vomiting. Slight shortness of breath. States she took 2 mg of OxyContin without relief. She states that she had a stress test a few years ago for palpitations and it was normal. She has a cardiologist. No known coronary disease.  Past Medical History  Diagnosis Date  . Depression   . Arrhythmia     Mitral valve regurgitation per pt- OV Dr Chancy Milroy, cardio 6/13- states had eccho and EKG  . Knee pain, right     arthrofibrosis rt knee  . Rosacea   . History of kidney stones    Past Surgical History  Procedure Laterality Date  . Wisdom tooth extraction    . Cryotherapy      CERVIX  . Colonoscopy  03/15/2011    NORMAL  . Orif patella  07/22/2011    Procedure: OPEN REDUCTION INTERNAL (ORIF) FIXATION PATELLA;  Surgeon: Johnn Hai, MD;  Location: [facility];  Service: Orthopedics;  Laterality: Right;  . Knee arthroscopy  02/03/2012    Procedure: ARTHROSCOPY KNEE;  Surgeon: Johnn Hai, MD;  Location: [facility];  Service: Orthopedics;  Laterality: Right;  Right Knee Arthroscopy with Manipulation Under Anesthesia/Evaluation Under Anesthesia/Debridement   Family History  Problem Relation Age of Onset  . Adopted: Yes   History  Substance Use Topics  . Smoking status: Former Smoker    Types:  Cigarettes    Quit date: 07/21/1998  . Smokeless tobacco: Never Used  . Alcohol Use: No     Comment: socially   OB History   Grav Para Term Preterm Abortions TAB SAB Ect Mult Living                 Review of Systems  Constitutional: Negative for activity change and appetite change.  Eyes: Negative for pain.  Respiratory: Negative for chest tightness and shortness of breath.   Cardiovascular: Positive for chest pain. Negative for leg swelling.  Gastrointestinal: Negative for nausea, vomiting, abdominal pain and diarrhea.  Genitourinary: Negative for flank pain.  Musculoskeletal: Negative for back pain and neck stiffness.  Skin: Negative for rash.  Neurological: Negative for weakness, numbness and headaches.  Psychiatric/Behavioral: Negative for behavioral problems.      Allergies  Valium  Home Medications   Prior to Admission medications   Medication Sig Start Date End Date Taking? Authorizing Provider  ALPRAZolam (XANAX) 0.25 MG tablet Take 0.25 mg by mouth 3 (three) times daily as needed for anxiety.  05/30/13  Yes Historical Provider, MD  ibuprofen (ADVIL,MOTRIN) 200 MG tablet Take 400 mg by mouth every 6 (six) hours as needed for moderate pain.   Yes Historical Provider, MD  Ibuprofen-Diphenhydramine Cit (ADVIL PM) 200-38 MG TABS Take 1 tablet by mouth at bedtime as needed (sleep/pain).   Yes Historical Provider, MD  LO LOESTRIN FE 1  MG-10 MCG / 10 MCG tablet Take 1 tablet by mouth every morning.  05/25/13  Yes Historical Provider, MD  Multiple Vitamins-Minerals (CENTRUM FLAVOR BURST ADULT) CHEW Chew 2 tablets by mouth every morning.   Yes Historical Provider, MD   BP 131/84  Pulse 73  Temp(Src) 98.7 F (37.1 C) (Oral)  Resp 18  SpO2 100%  LMP 07/05/2013 Physical Exam  Nursing note and vitals reviewed. Constitutional: She is oriented to person, place, and time. She appears well-developed and well-nourished.  HENT:  Head: Normocephalic and atraumatic.  Eyes: EOM are  normal. Pupils are equal, round, and reactive to light.  Neck: Normal range of motion. Neck supple.  Cardiovascular: Normal rate, regular rhythm and normal heart sounds.   No murmur heard. Pulmonary/Chest: Effort normal and breath sounds normal. No respiratory distress. She has no wheezes. She has no rales.  Abdominal: Soft. Bowel sounds are normal. She exhibits no distension. There is no tenderness. There is no rebound and no guarding.  Musculoskeletal: Normal range of motion. She exhibits edema.  Trace bilateral lower extremity pitting edema  Neurological: She is alert and oriented to person, place, and time. No cranial nerve deficit.  Skin: Skin is warm and dry.  Psychiatric: She has a normal mood and affect. Her speech is normal.    ED Course  Procedures (including critical care time) Labs Review Labs Reviewed  CBC WITH DIFFERENTIAL - Abnormal; Notable for the following:    WBC 11.5 (*)    Neutro Abs 7.8 (*)    All other components within normal limits  D-DIMER, QUANTITATIVE - Abnormal; Notable for the following:    D-Dimer, Quant 0.59 (*)    All other components within normal limits  LIPASE, BLOOD - Abnormal; Notable for the following:    Lipase 74 (*)    All other components within normal limits  I-STAT CHEM 8, ED - Abnormal; Notable for the following:    Potassium 3.4 (*)    All other components within normal limits  HEPATIC FUNCTION PANEL  I-STAT TROPOININ, ED  Randolm Idol, ED    Imaging Review Dg Chest 2 View  07/17/2013   CLINICAL DATA:  Chest pain.  EXAM: CHEST  2 VIEW  COMPARISON:  None.  FINDINGS: Heart size and mediastinal contours are within normal limits. Both lungs are clear. Visualized skeletal structures are unremarkable.  IMPRESSION: Negative exam.   Electronically Signed   By: Inge Rise M.D.   On: 07/17/2013 08:22   Ct Angio Chest W/cm &/or Wo Cm  07/17/2013   CLINICAL DATA:  12 hr of chest pain with nausea.  EXAM: CT ANGIOGRAPHY CHEST WITH  CONTRAST  TECHNIQUE: Multidetector CT imaging of the chest was performed using the standard protocol during bolus administration of intravenous contrast. Multiplanar CT image reconstructions and MIPs were obtained to evaluate the vascular anatomy.  CONTRAST:  172m OMNIPAQUE IOHEXOL 350 MG/ML SOLN intravenously  COMPARISON:  PA and lateral chest x-ray of today's date  FINDINGS: Contrast within the pulmonary arterial tree is normal. There are no filling defects to suggest an acute pulmonary embolism. The caliber of the thoracic aorta is normal. There is no false lumen. The cardiac chambers are normal in size. There is no pleural nor pericardial effusion. There is no mediastinal or hilar lymphadenopathy. The thoracic esophagus is normal. The lungs are well-expanded and exhibit no infiltrate or atelectasis. There are no pulmonary parenchymal masses or nodules.  The observed portions of the bony thorax are unremarkable. Within the upper  abdomen no acute abnormality is demonstrated.  Review of the MIP images confirms the above findings.  IMPRESSION: 1. There is no acute pulmonary embolism nor acute thoracic aortic pathology. 2. There is no CHF or pneumonia nor other acute cardiopulmonary abnormality. 3. The observed portions of the bony thorax are normal.   Electronically Signed   By: David  Martinique   On: 07/17/2013 10:32     EKG Interpretation   Date/Time:  Tuesday July 17 2013 07:06:04 EDT Ventricular Rate:  105 PR Interval:  130 QRS Duration: 75 QT Interval:  398 QTC Calculation: 526 R Axis:   47 Text Interpretation:  Sinus tachycardia Low voltage, extremity and  precordial leads Borderline repolarization abnormality Prolonged QT  interval Confirmed by Alvino Chapel  MD, Ovid Curd [phone]) on 07/17/2013 7:27:18  AM      MDM   Final diagnoses:  Other chest pain    Patient with left upper chest pain with some radiation to arm. No rash. EKG reassuring. Enzymes negative x2. Positive d-dimer, however she  has a negative CT scan. Will discharge home to followup with her primary care Dr. Daiva Eves R. Alvino Chapel, MD 07/17/13 [phone]

## 2013-07-17 NOTE — ED Notes (Signed)
Bed: UQ34 Expected date:  Expected time:  Means of arrival:  Comments: EMS/chest pressure relieved with Toradol

## 2013-07-17 NOTE — ED Notes (Signed)
Pt complains of an achy chest pain since 10pm last night, Sunday night she states she was nauseated and her husband states that she is clammy. Pt states that her pain radiates to her mid back

## 2013-11-06 DIAGNOSIS — N838 Other noninflammatory disorders of ovary, fallopian tube and broad ligament: Secondary | ICD-10-CM | POA: Insufficient documentation

## 2013-11-06 DIAGNOSIS — N839 Noninflammatory disorder of ovary, fallopian tube and broad ligament, unspecified: Secondary | ICD-10-CM | POA: Insufficient documentation

## 2013-11-19 ENCOUNTER — Other Ambulatory Visit: Payer: Self-pay | Admitting: Neurology

## 2013-11-19 DIAGNOSIS — G4489 Other headache syndrome: Secondary | ICD-10-CM

## 2013-11-23 ENCOUNTER — Ambulatory Visit (INDEPENDENT_AMBULATORY_CARE_PROVIDER_SITE_OTHER): Payer: BC Managed Care – PPO | Admitting: Physician Assistant

## 2013-11-23 VITALS — BP 112/78 | HR 95 | Temp 98.0°F | Resp 16 | Ht 61.0 in | Wt 162.0 lb

## 2013-11-23 DIAGNOSIS — R1030 Lower abdominal pain, unspecified: Secondary | ICD-10-CM

## 2013-11-23 DIAGNOSIS — R350 Frequency of micturition: Secondary | ICD-10-CM

## 2013-11-23 LAB — POCT URINALYSIS DIPSTICK
Bilirubin, UA: NEGATIVE
Glucose, UA: NEGATIVE
KETONES UA: NEGATIVE
Leukocytes, UA: NEGATIVE
Nitrite, UA: NEGATIVE
PH UA: 6.5
Protein, UA: NEGATIVE
Spec Grav, UA: 1.02
Urobilinogen, UA: 0.2

## 2013-11-23 LAB — POCT CBC
GRANULOCYTE PERCENT: 66.9 % (ref 37–80)
HEMATOCRIT: 40.4 % (ref 37.7–47.9)
HEMOGLOBIN: 13.3 g/dL (ref 12.2–16.2)
Lymph, poc: 3.5 — AB (ref 0.6–3.4)
MCH, POC: 30.8 pg (ref 27–31.2)
MCHC: 33 g/dL (ref 31.8–35.4)
MCV: 93.5 fL (ref 80–97)
MID (cbc): 0.6 (ref 0–0.9)
MPV: 6.9 fL (ref 0–99.8)
POC Granulocyte: 8.3 — AB (ref 2–6.9)
POC LYMPH PERCENT: 28.5 %L (ref 10–50)
POC MID %: 4.6 %M (ref 0–12)
Platelet Count, POC: 302 10*3/uL (ref 142–424)
RBC: 4.33 M/uL (ref 4.04–5.48)
RDW, POC: 12.8 %
WBC: 12.4 10*3/uL — AB (ref 4.6–10.2)

## 2013-11-23 LAB — POCT UA - MICROSCOPIC ONLY
CASTS, UR, LPF, POC: NEGATIVE
Crystals, Ur, HPF, POC: NEGATIVE
MUCUS UA: NEGATIVE
RBC, urine, microscopic: NEGATIVE
YEAST UA: NEGATIVE

## 2013-11-23 NOTE — Progress Notes (Signed)
Subjective:    Patient ID: Sarah Cobb, female    DOB: 12/08/1970, 43 y.o.   MRN: [id number]  HPI Presents for possible UTI for the past 2 days. Has had mild crampy suprapubic pain and possible frequency. No burning with urination, but feels pressure. Just started menstrual cycle yesterday so unsure if sx are related. Not currently sexually active due to recent knee surgery. Works in administration at [employer] and doesn't generally hold urine for prolonged time. Last pap 2012 was normal. Had pelvic exam earlier this year that was also normal.  Told in the past that she may have diverticulitis, but no follow up done.  Recent antibiotic use: Doxycycline 40 mg for roscea  Review of Systems  Constitutional: Negative for fever, diaphoresis, activity change and appetite change.  Gastrointestinal: Positive for abdominal pain. Negative for nausea, vomiting, diarrhea and constipation.       Has loose stools with urination  Genitourinary: Positive for frequency. Negative for dysuria, hematuria, flank pain, vaginal discharge, difficulty urinating and vaginal pain.  Musculoskeletal: Negative for back pain.  Allergic/Immunologic: Negative for environmental allergies and food allergies.       Objective:   Physical Exam  Constitutional: She is oriented to person, place, and time. She appears well-developed and well-nourished.  Blood pressure 112/78, pulse 95, temperature 98 F (36.7 C), temperature source Oral, resp. rate 16, height 5' 1"  (1.549 m), weight 162 lb (73.483 kg), SpO2 98 %.   HENT:  Head: Normocephalic and atraumatic.  Right Ear: External ear normal.  Left Ear: External ear normal.  Eyes: Conjunctivae are normal. Right eye exhibits no discharge. Left eye exhibits no discharge. No scleral icterus.  Cardiovascular: Normal rate, regular rhythm and normal heart sounds.  Exam reveals no gallop and no friction rub.   No murmur heard. Pulmonary/Chest: Effort normal and breath  sounds normal. No respiratory distress. She has no wheezes. She has no rales. She exhibits no tenderness.  Abdominal: Soft. Bowel sounds are normal. She exhibits no distension and no mass. There is tenderness (suprapubic). There is no rigidity, no rebound, no guarding, no CVA tenderness, no tenderness at McBurney's point and negative Murphy's sign. No hernia.  Neurological: She is alert and oriented to person, place, and time.  Skin: Skin is warm and dry. No rash noted. No erythema. No pallor.   Results for orders placed or performed in visit on 11/23/13  POCT urinalysis dipstick  Result Value Ref Range   Color, UA yellow    Clarity, UA clear    Glucose, UA neg    Bilirubin, UA neg    Ketones, UA neg    Spec Grav, UA 1.020    Blood, UA moderate    pH, UA 6.5    Protein, UA neg    Urobilinogen, UA 0.2    Nitrite, UA neg    Leukocytes, UA Negative   POCT UA - Microscopic Only  Result Value Ref Range   WBC, Ur, HPF, POC 0-2    RBC, urine, microscopic neg    Bacteria, U Microscopic trace    Mucus, UA neg    Epithelial cells, urine per micros 1-4    Crystals, Ur, HPF, POC neg    Casts, Ur, LPF, POC neg    Yeast, UA neg   POCT CBC  Result Value Ref Range   WBC 12.4 (A) 4.6 - 10.2 K/uL   Lymph, poc 3.5 (A) 0.6 - 3.4   POC LYMPH PERCENT 28.5 10 - 50 %L  MID (cbc) 0.6 0 - 0.9   POC MID % 4.6 0 - 12 %M   POC Granulocyte 8.3 (A) 2 - 6.9   Granulocyte percent 66.9 37 - 80 %G   RBC 4.33 4.04 - 5.48 M/uL   Hemoglobin 13.3 12.2 - 16.2 g/dL   HCT, POC 40.4 37.7 - 47.9 %   MCV 93.5 80 - 97 fL   MCH, POC 30.8 27 - 31.2 pg   MCHC 33.0 31.8 - 35.4 g/dL   RDW, POC 12.8 %   Platelet Count, POC 302 142 - 424 K/uL   MPV 6.9 0 - 99.8 fL       Assessment & Plan:  1. Urinary frequency - POCT urinalysis dipstick - POCT UA - Microscopic Only  2. Lower abdominal pain Pain not likely due to urinary tract. Possibly diverticulitis. Due to only mild elevation in white count and patient's pain  level, will hold off on CT and re-evaluate her on 11/24/13 or 11/25/13. If sx improve, then likely minor GI upset. If sx continue or worsen, will send for CT. Will recheck CBC when she RTC. Patient will go to ER if we are not open if, pain intensifies or fever of 101 degrees. - POCT CBC   Alveta Heimlich PA-C  [facility] 11/23/2013 7:21 PM

## 2013-11-23 NOTE — Patient Instructions (Addendum)
1. Return to clinic to see Sarah Cobb on 11/24/13 or 11/25/13 for re-evaluation.

## 2013-11-24 NOTE — Progress Notes (Signed)
I was directly involved with the patient's care and agree with the diagnosis and treatment plan.  I additional examined the patient due to her need for re-evaluation in 1-2 days.  She has significant TTP in her LLQ without rebound and she does not appear ill.  She is currently on her menses.  She states her abd pain is cramping and not bothering her that much and she would like to wait over night to see if it improved vs going tonight and having a CT scan.  I think diverticulitis is a high possibility but without fever, non-ill appearing adult who is not bothered that much by her pain with only a slightly elevated WBC she could wait overnight.  We discussed possibility of this getting worse throughout the night and she is go to the ED if her pain worsens or she develops fever.

## 2013-11-25 ENCOUNTER — Telehealth: Payer: Self-pay | Admitting: Physician Assistant

## 2013-11-25 ENCOUNTER — Ambulatory Visit (INDEPENDENT_AMBULATORY_CARE_PROVIDER_SITE_OTHER): Payer: BC Managed Care – PPO | Admitting: Physician Assistant

## 2013-11-25 VITALS — BP 114/72 | HR 94 | Temp 98.5°F | Resp 18 | Ht 60.0 in | Wt 159.0 lb

## 2013-11-25 DIAGNOSIS — Z8719 Personal history of other diseases of the digestive system: Secondary | ICD-10-CM | POA: Insufficient documentation

## 2013-11-25 DIAGNOSIS — R1030 Lower abdominal pain, unspecified: Secondary | ICD-10-CM

## 2013-11-25 DIAGNOSIS — D72829 Elevated white blood cell count, unspecified: Secondary | ICD-10-CM

## 2013-11-25 LAB — POCT CBC
Granulocyte percent: 68.1 %G (ref 37–80)
HEMATOCRIT: 41.2 % (ref 37.7–47.9)
Hemoglobin: 13.4 g/dL (ref 12.2–16.2)
LYMPH, POC: 3 (ref 0.6–3.4)
MCH, POC: 30.4 pg (ref 27–31.2)
MCHC: 32.4 g/dL (ref 31.8–35.4)
MCV: 93.9 fL (ref 80–97)
MID (cbc): 0.3 (ref 0–0.9)
MPV: 6.7 fL (ref 0–99.8)
POC Granulocyte: 7 — AB (ref 2–6.9)
POC LYMPH %: 29.4 % (ref 10–50)
POC MID %: 2.5 % (ref 0–12)
Platelet Count, POC: 297 10*3/uL (ref 142–424)
RBC: 4.39 M/uL (ref 4.04–5.48)
RDW, POC: 12.6 %
WBC: 10.3 10*3/uL — AB (ref 4.6–10.2)

## 2013-11-25 NOTE — Patient Instructions (Addendum)
We are going to continue the watch and wait.  If anything should change please return to the clinic - such as fever, increasing abdominal pain or diarrhea.

## 2013-11-25 NOTE — Telephone Encounter (Signed)
Called patient to check on her status.  Pt is feeling some better and on her way to be rechecked.

## 2013-11-25 NOTE — Progress Notes (Signed)
Subjective:    Patient ID: Sarah Cobb, female    DOB: Mar 15, 1970, 43 y.o.   MRN: [id number]  HPI  Pt presents to clinic for a recheck.  She is doing much better.  The pain is significnatly improved.  She can move without pain.  She has had a normal BM yesterday and has had no other episodes of diarrhea.  She has not have fevers or chills  Prior to Admission medications   Medication Sig Start Date End Date Taking? Authorizing Provider  ALPRAZolam (XANAX) 0.25 MG tablet Take 0.25 mg by mouth 3 (three) times daily as needed for anxiety.  05/30/13   Historical Provider, MD  doxycycline (ORACEA) 40 MG capsule Take 40 mg by mouth every morning.    Historical Provider, MD  ibuprofen (ADVIL,MOTRIN) 200 MG tablet Take 400 mg by mouth every 6 (six) hours as needed for moderate pain.    Historical Provider, MD  Ibuprofen-Diphenhydramine Cit (ADVIL PM) 200-38 MG TABS Take 1 tablet by mouth at bedtime as needed (sleep/pain).    Historical Provider, MD  LO LOESTRIN FE 1 MG-10 MCG / 10 MCG tablet Take 1 tablet by mouth every morning.  05/25/13   Historical Provider, MD  metaxalone (SKELAXIN) 800 MG tablet Take 400 mg by mouth 3 (three) times daily.    Historical Provider, MD  Multiple Vitamins-Minerals (CENTRUM FLAVOR BURST ADULT) CHEW Chew 2 tablets by mouth every morning.    Historical Provider, MD     Allergies  Allergen Reactions  . Valium Palpitations    MAKES HEART RACE    Patient Active Problem List   Diagnosis Date Noted  . History of anal fissures 11/25/2013  . Diminished ovarian reserve 11/06/2013     Review of Systems  Constitutional: Negative for fever and chills.  Gastrointestinal: Positive for abdominal pain. Negative for nausea, vomiting, diarrhea, constipation and abdominal distention.  Genitourinary: Negative.        Objective:   Physical Exam  Constitutional: She is oriented to person, place, and time. She appears well-developed and well-nourished.  BP 114/72 mmHg   Pulse 94  Temp(Src) 98.5 F (36.9 C)  Resp 18  Ht 5' (1.524 m)  Wt 159 lb (72.122 kg)  BMI 31.05 kg/m2  SpO2 97%   HENT:  Head: Normocephalic and atraumatic.  Right Ear: External ear normal.  Left Ear: External ear normal.  Cardiovascular: Normal rate, regular rhythm and normal heart sounds.   No murmur heard. Pulmonary/Chest: Effort normal.  Abdominal: Soft. She exhibits no mass. There is tenderness (mild TTP over suprapubis area, no TTP on LLQ). There is no rebound, no guarding and no CVA tenderness.  Neurological: She is alert and oriented to person, place, and time.  Skin: Skin is warm and dry.  Psychiatric: She has a normal mood and affect. Her behavior is normal. Judgment and thought content normal.   Results for orders placed or performed in visit on 11/25/13  POCT CBC  Result Value Ref Range   WBC 10.3 (A) 4.6 - 10.2 K/uL   Lymph, poc 3.0 0.6 - 3.4   POC LYMPH PERCENT 29.4 10 - 50 %L   MID (cbc) 0.3 0 - 0.9   POC MID % 2.5 0 - 12 %M   POC Granulocyte 7.0 (A) 2 - 6.9   Granulocyte percent 68.1 37 - 80 %G   RBC 4.39 4.04 - 5.48 M/uL   Hemoglobin 13.4 12.2 - 16.2 g/dL   HCT, POC 41.2 37.7 - 47.9 %  MCV 93.9 80 - 97 fL   MCH, POC 30.4 27 - 31.2 pg   MCHC 32.4 31.8 - 35.4 g/dL   RDW, POC 12.6 %   Platelet Count, POC 297 142 - 424 K/uL   MPV 6.7 0 - 99.8 fL       Assessment & Plan:  Leukocytosis - Plan: POCT CBC  Abd pain - significantly improved with an improved WBC.  She will continue to monitor and hydrate and RTC if her pain changes or worsens.  Windell Hummingbird PA-C  [facility] 11/25/2013 1:28 PM

## 2013-11-27 ENCOUNTER — Other Ambulatory Visit: Payer: BC Managed Care – PPO

## 2013-12-03 ENCOUNTER — Ambulatory Visit
Admission: RE | Admit: 2013-12-03 | Discharge: 2013-12-03 | Disposition: A | Discharge status: Home / Self Care | Payer: BC Managed Care – PPO | Source: Ambulatory Visit | Attending: Neurology | Admitting: Neurology

## 2013-12-03 DIAGNOSIS — G4489 Other headache syndrome: Secondary | ICD-10-CM

## 2013-12-03 MED ORDER — GADOBENATE DIMEGLUMINE 529 MG/ML IV SOLN
15.0000 mL | Freq: Once | INTRAVENOUS | Status: AC | PRN
  Administered 2013-12-03: 15 mL via INTRAVENOUS

## 2014-01-04 HISTORY — PX: UPPER GI ENDOSCOPY: SHX6162

## 2014-05-23 ENCOUNTER — Telehealth: Payer: Self-pay | Admitting: Gastroenterology

## 2014-05-23 NOTE — Telephone Encounter (Signed)
Spoke with Wells Guiles and scheduled patient on 05/29/14 at 2:15 PM with Lori Hvozdovic, PA-C. Wells Guiles will fax records.

## 2014-05-24 ENCOUNTER — Other Ambulatory Visit: Payer: Self-pay | Admitting: Physician Assistant

## 2014-05-24 ENCOUNTER — Ambulatory Visit
Admission: RE | Admit: 2014-05-24 | Discharge: 2014-05-24 | Disposition: A | Discharge status: Home / Self Care | Payer: BC Managed Care – PPO | Source: Ambulatory Visit | Attending: Physician Assistant | Admitting: Physician Assistant

## 2014-05-24 DIAGNOSIS — R1011 Right upper quadrant pain: Secondary | ICD-10-CM

## 2014-05-29 ENCOUNTER — Ambulatory Visit (INDEPENDENT_AMBULATORY_CARE_PROVIDER_SITE_OTHER): Payer: BC Managed Care – PPO | Admitting: Physician Assistant

## 2014-05-29 ENCOUNTER — Encounter: Payer: Self-pay | Admitting: Physician Assistant

## 2014-05-29 VITALS — BP 114/74 | HR 100 | Ht 60.0 in | Wt 165.0 lb

## 2014-05-29 DIAGNOSIS — R109 Unspecified abdominal pain: Secondary | ICD-10-CM | POA: Diagnosis not present

## 2014-05-29 DIAGNOSIS — K760 Fatty (change of) liver, not elsewhere classified: Secondary | ICD-10-CM | POA: Diagnosis not present

## 2014-05-29 DIAGNOSIS — K219 Gastro-esophageal reflux disease without esophagitis: Secondary | ICD-10-CM | POA: Diagnosis not present

## 2014-05-29 MED ORDER — PANTOPRAZOLE SODIUM 40 MG PO TBEC: 40.0000 mg | DELAYED_RELEASE_TABLET | Freq: Every day | ORAL | Status: DC

## 2014-05-29 NOTE — Progress Notes (Signed)
Patient ID: Sarah Cobb, female   DOB: 04/24/1970, 44 y.o.   MRN: [id number]     History of Present Illness: Sarah Cobb  is a pleasant 44 year old female who was last seen here in June 2015 for hemorrhoids. She is previously known to Dr. Sharlett Iles having had a colonoscopy in March 2013 at which time the study was normal she has a history of anal fissures as well.  She is here today with complaints of intermittent epigastric and right upper quadrant pain since February. Her pain comes and goes. She was evaluated by her PCP and had an abdominal ultrasound which was normal and blood work which was normal. The pain is relatively constant. It tends to be worse on an empty stomach and alleviated temporarily with ingestion of food but then it comes back. She gets heartburn on a daily basis. She was started on Zantac which provided some relief but she still gets heartburn on a daily basis. She has no dysphasia. She does have epigastric pain. She is nauseous and her nausea is worse on an empty stomach and alleviated for a short period of time with food and then comes back. Her pain occasionally radiates to the back. She has been belching and burping a lot. She has episodes of regurgitation at night. Her appetite is good but she feels full after several bites.  Past Medical History  Diagnosis Date  . Depression   . Arrhythmia     Mitral valve regurgitation per pt- OV Dr Chancy Milroy, cardio 6/13- states had eccho and EKG  . Knee pain, right     arthrofibrosis rt knee  . Rosacea   . History of kidney stones     Past Surgical History  Procedure Laterality Date  . Wisdom tooth extraction    . Cryotherapy      CERVIX  . Colonoscopy  03/15/2011    NORMAL  . Orif patella  07/22/2011    Procedure: OPEN REDUCTION INTERNAL (ORIF) FIXATION PATELLA;  Surgeon: Johnn Hai, MD;  Location: [facility];  Service: Orthopedics;  Laterality: Right;  . Knee arthroscopy  02/03/2012    Procedure: ARTHROSCOPY KNEE;  Surgeon:  Johnn Hai, MD;  Location: [facility];  Service: Orthopedics;  Laterality: Right;  Right Knee Arthroscopy with Manipulation Under Anesthesia/Evaluation Under Anesthesia/Debridement   Family History  Problem Relation Age of Onset  . Adopted: Yes   History  Substance Use Topics  . Smoking status: Former Smoker    Types: Cigarettes    Quit date: 07/21/1998  . Smokeless tobacco: Never Used  . Alcohol Use: No     Comment: socially   Current Outpatient Prescriptions  Medication Sig Dispense Refill  . ALPRAZolam (XANAX) 0.25 MG tablet Take 0.25 mg by mouth 3 (three) times daily as needed for anxiety.     Marland Kitchen doxycycline (ORACEA) 40 MG capsule Take 40 mg by mouth every morning.    Marland Kitchen ibuprofen (ADVIL,MOTRIN) 200 MG tablet Take 400 mg by mouth every 6 (six) hours as needed for moderate pain.    . Ibuprofen-Diphenhydramine Cit (ADVIL PM) 200-38 MG TABS Take 1 tablet by mouth at bedtime as needed (sleep/pain).    . LO LOESTRIN FE 1 MG-10 MCG / 10 MCG tablet Take 1 tablet by mouth every morning.     . metaxalone (SKELAXIN) 800 MG tablet Take 400 mg by mouth as needed.     . Multiple Vitamins-Minerals (MULTIVITAMIN PO) Take 1 tablet by mouth daily. Pt takes Flintstones chewable tablet every day    .  ranitidine (ZANTAC) 150 MG tablet Take 150 mg by mouth 2 (two) times daily.     . valACYclovir (VALTREX) 1000 MG tablet Take 1,000 mg by mouth as needed. For cold sores  0  . pantoprazole (PROTONIX) 40 MG tablet Take 1 tablet (40 mg total) by mouth daily. 30 tablet 6   No current facility-administered medications for this visit.   Allergies  Allergen Reactions  . Valium Palpitations    MAKES HEART RACE      Review of Systems: Gen: Denies any fever, chills, sweats, anorexia, fatigue, weakness, malaise, weight loss, and sleep disorder CV: Denies chest pain, angina, palpitations, syncope, orthopnea, PND, peripheral edema, and claudication. Resp: Denies dyspnea at rest, dyspnea with exercise, cough,  sputum, wheezing, coughing up blood, and pleurisy. GI: Denies vomiting blood, jaundice, and fecal incontinence.   Denies dysphagia or odynophagia. GU : Denies urinary burning, blood in urine, urinary frequency, urinary hesitancy, nocturnal urination, and urinary incontinence. MS: Denies joint pain, limitation of movement, and swelling, stiffness, low back pain, extremity pain. Denies muscle weakness, cramps, atrophy.  Derm: Denies rash, itching, dry skin, hives, moles, warts, or unhealing ulcers.  Psych: Denies depression, anxiety, memory loss, suicidal ideation, hallucinations, paranoia, and confusion. Heme: Denies bruising, bleeding, and enlarged lymph nodes. Neuro:  Denies any headaches, dizziness, paresthesia Endo:  Denies any problems with DM, thyroid, adrenal  LAB RESULTS: Blood work from 05/13/2014 shows a CBC with white blood count 9.1, hemoglobin 13.5, hematocrit 39.3, platelet count 248,000, MCV 91. Comprehensive metabolic panel from 87/86/7672 shows albumin 4.2, total bili 0.2, alkaline phosphatase 69, AST 26, ALT 31.   Studies:   Dg Chest 2 View  05/24/2014   CLINICAL DATA:  44 year old female with a history of intermittent cough  EXAM: CHEST - 2 VIEW  COMPARISON:  Chest x-ray 07/17/2013, CT 07/17/2013  FINDINGS: Cardiomediastinal silhouette projects within normal limits in size and contour. No confluent airspace disease, pneumothorax, or pleural effusion.  No displaced fracture.  Unremarkable appearance of the upper abdomen.  IMPRESSION: No radiographic evidence of acute cardiopulmonary disease.  Signed,  Dulcy Fanny. Earleen Newport, DO  [facility]  [facility]   Electronically Signed   By: Corrie Mckusick D.O.   On: 05/24/2014 16:04   Abdominal ultrasound done at no vomiting on 03/14/2014 showed fatty liver but was otherwise normal. Gallbladder had no stones or wall thickening. Sonographic Percell Miller sign is negative. Common bile duct 6 mm.  Physical  Exam: General: Pleasant, well developed female in no acute distress Head: Normocephalic and atraumatic Eyes:  sclerae anicteric, conjunctiva pink  Ears: Normal auditory acuity Lungs: Clear throughout to auscultation Heart: Regular rate and rhythm Abdomen: Soft, non distended, mild epigastric and right upper quadrant tenderness, no rebound, no guarding, no costovertebral angle tenderness. No masses, no hepatomegaly. Normal bowel sounds Musculoskeletal: Symmetrical with no gross deformities  Extremities: No edema  Neurological: Alert oriented x 4, grossly nonfocal Psychological:  Alert and cooperative. Normal mood and affect  Assessment and Recommendations: #1 GERD. An antireflux regimen has been reviewed. She will be given a trial of Protonix 40 mg 1 by mouth every morning 30 minutes prior to breakfast.  #2. Epigastric pain. May be due to some poorly controlled reflux, gastritis, or ulcer. She has been advised to undergo an EGD to assess for esophagitis, gastritis, ulcer etc.The risks, benefits, and alternatives to endoscopy with possible biopsy and possible dilation were discussed with the patient and they consent to proceed.  The procedure will be  scheduled with Dr. Olevia Perches.  #3. Fatty liver. She has been advised to try to lose weight to try to exercise at least 3 days per week. Patient says she has already been vaccinated for hepatitis A and hepatitis B at work.  Follow-up recommendations will be made pending the findings of her EGD. If her EGD is nonrevealing, she may be a candidate for a HIDA scan.    Turrell Severt, Deloris Ping 05/29/2014,

## 2014-05-29 NOTE — Patient Instructions (Signed)
You have been scheduled for an endoscopy. Please follow written instructions given to you at your visit today. If you use inhalers (even only as needed), please bring them with you on the day of your procedure. Your physician has requested that you go to www.startemmi.com and enter the access code given to you at your visit today. This web site gives a general overview about your procedure. However, you should still follow specific instructions given to you by our office regarding your preparation for the procedure. We have sent medications to your pharmacy for you to pick up at your convenience.  Gastroesophageal Reflux Disease, Adult Gastroesophageal reflux disease (GERD) happens when acid from your stomach flows up into the esophagus. When acid comes in contact with the esophagus, the acid causes soreness (inflammation) in the esophagus. Over time, GERD may create small holes (ulcers) in the lining of the esophagus. CAUSES   Increased body weight. This puts pressure on the stomach, making acid rise from the stomach into the esophagus.  Smoking. This increases acid production in the stomach.  Drinking alcohol. This causes decreased pressure in the lower esophageal sphincter (valve or ring of muscle between the esophagus and stomach), allowing acid from the stomach into the esophagus.  Late evening meals and a full stomach. This increases pressure and acid production in the stomach.  A malformed lower esophageal sphincter. Sometimes, no cause is found.  Fat and Cholesterol Control Diet Your diet has an affect on your fat and cholesterol levels in your blood and organs. Too much fat and cholesterol in your blood can affect your:  Heart.  Blood vessels (arteries, veins).  Gallbladder.  Liver.  Pancreas. CONTROL FAT AND CHOLESTEROL WITH DIET Certain foods raise cholesterol and others lower it. It is important to replace bad fats with other types of fat.  Do not eat:  Fatty meats,  such as hot dogs and salami.  Stick margarine and some tub margarines that have "partially hydrogenated oils" in them.  Baked goods, such as cookies and crackers that have "partially hydrogenated oils" in them.  Saturated tropical oils, such as coconut and palm oil. Eat the following foods:  Round or loin cuts of red meat.  Chicken (without skin).  Fish.  Veal.  Ground [place] breast.  Shellfish.  Fruit, such as apples.  Vegetables, such as broccoli, potatoes, and carrots.  Beans, peas, and lentils (legumes).  Grains, such as barley, rice, couscous, and bulgar wheat.  Pasta (without cream sauces). Look for foods that are nonfat, low in fat, and low in cholesterol.  FIND FOODS THAT ARE LOWER IN FAT AND CHOLESTEROL  Find foods with soluble fiber and plant sterols (phytosterol). You should eat 2 grams a day of these foods. These foods include:  Fruits.  Vegetables.  Whole grains.  Dried beans and peas.  Nuts and seeds.  Read package labels. Look for low-saturated fats, trans fat free, low-fat foods.  Choose cheese that have only 2 to 3 grams of saturated fat per ounce.  Use heart-healthy tub margarine that is free of trans fat or partially hydrogenated oil.  Avoid buying baked goods that have partially hydrogenated oils in them. Instead, buy baked goods made with whole grains (whole-wheat or whole oat flour). Avoid baked goods labeled with "flour" or "enriched flour."  Buy non-creamy canned soups with reduced salt and no added fats. PREPARING YOUR FOOD  Broil, bake, steam, or roast foods. Do not fry food.  Use non-stick cooking sprays.  Use lemon or herbs  to flavor food instead of using butter or stick margarine.  Use nonfat yogurt, salsa, or low-fat dressings for salads. LOW-SATURATED FAT / LOW-FAT FOOD SUBSTITUTES  Meats / Saturated Fat (g)  Avoid: Steak, marbled (3 oz/85 g) / 11 g.  Choose: Steak, lean (3 oz/85 g) / 4 g.  Avoid: Hamburger (3 oz/85  g) / 7 g.  Choose: Hamburger, lean (3 oz/85 g) / 5 g.  Avoid: Ham (3 oz/85 g) / 6 g.  Choose: Ham, lean cut (3 oz/85 g) / 2.4 g.  Avoid: Chicken, with skin, dark meat (3 oz/85 g) / 4 g.  Choose: Chicken, skin removed, dark meat (3 oz/85 g) / 2 g.  Avoid: Chicken, with skin, light meat (3 oz/85 g) / 2.5 g.  Choose: Chicken, skin removed, light meat (3 oz/85 g) / 1 g. Dairy / Saturated Fat (g)  Avoid: Whole milk (1 cup) / 5 g.  Choose: Low-fat milk, 2% (1 cup) / 3 g.  Choose: Low-fat milk, 1% (1 cup) / 1.5 g.  Choose: Skim milk (1 cup) / 0.3 g.  Avoid: Hard cheese (1 oz/28 g) / 6 g.  Choose: Skim milk cheese (1 oz/28 g) / 2 to 3 g.  Avoid: Cottage cheese, 4% fat (1 cup) / 6.5 g.  Choose: Low-fat cottage cheese, 1% fat (1 cup) / 1.5 g.  Avoid: Ice cream (1 cup) / 9 g.  Choose: Sherbet (1 cup) / 2.5 g.  Choose: Nonfat frozen yogurt (1 cup) / 0.3 g.  Choose: Frozen fruit bar / trace.  Avoid: Whipped cream (1 tbs) / 3.5 g.  Choose: Nondairy whipped topping (1 tbs) / 1 g. Condiments / Saturated Fat (g)  Avoid: Mayonnaise (1 tbs) / 2 g.  Choose: Low-fat mayonnaise (1 tbs) / 1 g.  Avoid: Butter (1 tbs) / 7 g.  Choose: Extra light margarine (1 tbs) / 1 g.  Avoid: Coconut oil (1 tbs) / 11.8 g.  Choose: Olive oil (1 tbs) / 1.8 g.  Choose: Corn oil (1 tbs) / 1.7 g.  Choose: Safflower oil (1 tbs) / 1.2 g.  Choose: Sunflower oil (1 tbs) / 1.4 g.  Choose: Soybean oil (1 tbs) / 2.4 g .  Choose: Canola oil (1 tbs) / 1 g. Document Released: 06/22/2011 Document Revised: 08/23/2012 Document Reviewed: 03/22/2013 [facility] Patient Information 2015 [place], [place]. This information is not intended to replace advice given to you by your health care provider. Make sure you discuss any questions you have with your health care provider.   SYMPTOMS   Burning pain in the lower part of the mid-chest behind the breastbone and in the mid-stomach area. This may occur twice a  week or more often.  Trouble swallowing.  Sore throat.  Dry cough.  Asthma-like symptoms including chest tightness, shortness of breath, or wheezing. DIAGNOSIS  Your caregiver may be able to diagnose GERD based on your symptoms. In some cases, X-rays and other tests may be done to check for complications or to check the condition of your stomach and esophagus. TREATMENT  Your caregiver may recommend over-the-counter or prescription medicines to help decrease acid production. Ask your caregiver before starting or adding any new medicines.  HOME CARE INSTRUCTIONS   Change the factors that you can control. Ask your caregiver for guidance concerning weight loss, quitting smoking, and alcohol consumption.  Avoid foods and drinks that make your symptoms worse, such as:  Caffeine or alcoholic drinks.  Chocolate.  Peppermint or mint flavorings.  Garlic and onions.  Spicy foods.  Citrus fruits, such as oranges, lemons, or limes.  Tomato-based foods such as sauce, chili, salsa, and pizza.  Fried and fatty foods.  Avoid lying down for the 3 hours prior to your bedtime or prior to taking a nap.  Eat small, frequent meals instead of large meals.  Wear loose-fitting clothing. Do not wear anything tight around your waist that causes pressure on your stomach.  Raise the head of your bed 6 to 8 inches with wood blocks to help you sleep. Extra pillows will not help.  Only take over-the-counter or prescription medicines for pain, discomfort, or fever as directed by your caregiver.  Do not take aspirin, ibuprofen, or other nonsteroidal anti-inflammatory drugs (NSAIDs). SEEK IMMEDIATE MEDICAL CARE IF:   You have pain in your arms, neck, jaw, teeth, or back.  Your pain increases or changes in intensity or duration.  You develop nausea, vomiting, or sweating (diaphoresis).  You develop shortness of breath, or you faint.  Your vomit is green, yellow, black, or looks like coffee  grounds or blood.  Your stool is red, bloody, or black. These symptoms could be signs of other problems, such as heart disease, gastric bleeding, or esophageal bleeding. MAKE SURE YOU:   Understand these instructions.  Will watch your condition.  Will get help right away if you are not doing well or get worse. Document Released: 09/30/2004 Document Revised: 03/15/2011 Document Reviewed: 07/10/2010 [facility] Patient Information 2015 [place], [place]. This information is not intended to replace advice given to you by your health care provider. Make sure you discuss any questions you have with your health care provider.

## 2014-05-30 NOTE — Progress Notes (Signed)
Reviewed and agree.

## 2014-06-06 ENCOUNTER — Encounter: Payer: Self-pay | Admitting: Internal Medicine

## 2014-06-06 ENCOUNTER — Ambulatory Visit (AMBULATORY_SURGERY_CENTER): Payer: BC Managed Care – PPO | Admitting: Internal Medicine

## 2014-06-06 ENCOUNTER — Encounter: Payer: Self-pay | Admitting: *Deleted

## 2014-06-06 VITALS — BP 138/66 | HR 89 | Temp 98.4°F | Resp 19 | Ht 60.0 in | Wt 165.0 lb

## 2014-06-06 DIAGNOSIS — K219 Gastro-esophageal reflux disease without esophagitis: Secondary | ICD-10-CM | POA: Diagnosis not present

## 2014-06-06 DIAGNOSIS — R1013 Epigastric pain: Secondary | ICD-10-CM | POA: Diagnosis not present

## 2014-06-06 DIAGNOSIS — K208 Other esophagitis: Secondary | ICD-10-CM | POA: Diagnosis not present

## 2014-06-06 MED ORDER — RANITIDINE HCL 150 MG PO TABS: 300.0000 mg | ORAL_TABLET | Freq: Every day | ORAL | Status: DC

## 2014-06-06 MED ORDER — SODIUM CHLORIDE 0.9 % IV SOLN: 500.0000 mL | INTRAVENOUS | Status: DC

## 2014-06-06 NOTE — Progress Notes (Signed)
A/ox3 pleased with MAC, report to Suzanne RN 

## 2014-06-06 NOTE — Progress Notes (Signed)
Called to room to assist during endoscopic procedure.  Patient ID and intended procedure confirmed with present staff. Received instructions for my participation in the procedure from the performing physician.  

## 2014-06-06 NOTE — Op Note (Signed)
[place]  [address]. [place], [address]   ENDOSCOPY PROCEDURE REPORT  PATIENT: Sarah Cobb, Sarah Cobb  MR#: [id number] BIRTHDATE: 1970/07/11 , 7  yrs. old GENDER: female ENDOSCOPIST: Lafayette Dragon, MD REFERRED BY:  Mattie Marlin PA PROCEDURE DATE:  06/06/2014 PROCEDURE:  EGD w/ biopsy ASA CLASS:     Class II INDICATIONS:  Abdominal pain.  Epigastric pain and burning.  Normal upper abdominal ultrasound patient has been refractory to PPIs. MEDICATIONS: Monitored anesthesia care and Propofol 200 mg IV TOPICAL ANESTHETIC: none  DESCRIPTION OF PROCEDURE: After the risks benefits and alternatives of the procedure were thoroughly explained, informed consent was obtained.  The LB [id number] endoscope was introduced through the mouth and advanced to the second portion of the duodenum , Without limitations.  The instrument was slowly withdrawn as the mucosa was fully examined.      Esophagus : Proximal, mid esophageal mucosa was normal.  There  was grade A esophagitis at the GE junction with somewhat irregular Z line. One 5 mm erosion .  There was no stricture.  Biopsies were obtained to rule out Barrett's esophagus, there was 2-3 cm reducible hiatal hernia distal to the GE junction.  STOMACH: The mucosa of the stomach appeared normal.   A 3 cm hiatal hernia was noted.  DUODENUM: The duodenal mucosa showed no abnormalities in the bulb and 2nd part of the duodenum.  Retroflexed views revealed no abnormalities.     The scope was then withdrawn from the patient and the procedure completed.  COMPLICATIONS: There were no immediate complications.  ENDOSCOPIC IMPRESSION: 1.   Esophagus : Proximal, mid esophageal mucosa was normal.  There was grade A esophagitis ( one 5 mm erosion) at the GE junction with somewhat irregular Z line.  There was no stricture.  Biopsies were obtained to rule out Barrett's esophagus, there was 2-3 cm reducible hiatal hernia distal  to the GE junction 2.   The mucosa of the stomach appeared normal 3.   3 cm hiatal hernia 4.   The duodenal mucosa showed no abnormalities in the bulb and 2nd part of the duodenum  RECOMMENDATIONS: 1.  Await pathology results 2.  Anti-reflux regimen to be follow 3.  Protonix 40 mg in the morning 4. Ranitidine 300 mg at bedtime 5. Follow-up in the office.  If not improved we'll proceed with a HIDA scan  REPEAT EXAM: for EGD pending biopsy results.  eSigned:  Lafayette Dragon, MD 06/06/2014 10:02 AM    CC:  PATIENT NAME:  Marylouise, Mallet MR#: [id number]

## 2014-06-06 NOTE — Patient Instructions (Signed)
YOU HAD AN ENDOSCOPIC PROCEDURE TODAY AT [facility]:   Refer to the procedure report that was given to you for any specific questions about what was found during the examination.  If the procedure report does not answer your questions, please call your gastroenterologist to clarify.  If you requested that your care partner not be given the details of your procedure findings, then the procedure report has been included in a sealed envelope for you to review at your convenience later.  YOU SHOULD EXPECT: Some feelings of bloating in the abdomen. Passage of more gas than usual.  Walking can help get rid of the air that was put into your GI tract during the procedure and reduce the bloating. If you had a lower endoscopy (such as a colonoscopy or flexible sigmoidoscopy) you may notice spotting of blood in your stool or on the toilet paper. If you underwent a bowel prep for your procedure, you may not have a normal bowel movement for a few days.  Please Note:  You might notice some irritation and congestion in your nose or some drainage.  This is from the oxygen used during your procedure.  There is no need for concern and it should clear up in a day or so.  SYMPTOMS TO REPORT IMMEDIATELY:    Following upper endoscopy (EGD)  Vomiting of blood or coffee ground material  New chest pain or pain under the shoulder blades  Painful or persistently difficult swallowing  New shortness of breath  Fever of 100F or higher  Black, tarry-looking stools  For urgent or emergent issues, a gastroenterologist can be reached at any hour by calling [phone].   DIET: Your first meal following the procedure should be a small meal and then it is ok to progress to your normal diet. Heavy or fried foods are harder to digest and may make you feel nauseous or bloated.  Likewise, meals heavy in dairy and vegetables can increase bloating.  Drink plenty of fluids but you should avoid alcoholic beverages  for 24 hours.  ACTIVITY:  You should plan to take it easy for the rest of today and you should NOT DRIVE or use heavy machinery until tomorrow (because of the sedation medicines used during the test).    FOLLOW UP: Our staff will call the number listed on your records the next business day following your procedure to check on you and address any questions or concerns that you may have regarding the information given to you following your procedure. If we do not reach you, we will leave a message.  However, if you are feeling well and you are not experiencing any problems, there is no need to return our call.  We will assume that you have returned to your regular daily activities without incident.  If any biopsies were taken you will be contacted by phone or by letter within the next 1-3 weeks.  Please call us at [phone] if you have not heard about the biopsies in 3 weeks.    SIGNATURES/CONFIDENTIALITY: You and/or your care partner have signed paperwork which will be entered into your electronic medical record.  These signatures attest to the fact that that the information above on your After Visit Summary has been reviewed and is understood.  Full responsibility of the confidentiality of this discharge information lies with you and/or your care-partner.  Be sure to take your medication as directed, and read all of the handouts given to you by your  recovery room nurse.

## 2014-06-07 ENCOUNTER — Telehealth: Payer: Self-pay | Admitting: *Deleted

## 2014-06-07 NOTE — Telephone Encounter (Signed)
  Follow up Call-  Call back number 06/06/2014  Post procedure Call Back phone  # [phone]  Permission to leave phone message Yes     Patient questions:  Do you have a fever, pain , or abdominal swelling? No. Pain Score  0 *  Have you tolerated food without any problems? Yes.    Have you been able to return to your normal activities? Yes.    Do you have any questions about your discharge instructions: Diet   No. Medications  No. Follow up visit  No.  Do you have questions or concerns about your Care? Yes.    Actions: * If pain score is 4 or above: No action needed, pain <4.    Pt. States that she was told she had a hiatal hernia.  She looked this up on internet and wants to know which kind she has.  She states that one type is sliding and the other "peri-esophageal"? Her report states the hernia is 2-3cm reducible distal to GE junction.  She would like to know "which" one she has.  Please advise.

## 2014-06-07 NOTE — Telephone Encounter (Signed)
Let patient know that Dr. Carlean Purl  Advised she had a small, sliding hernia, that was unlikely to cause any serious problems.  She verbalized understanding.

## 2014-06-07 NOTE — Telephone Encounter (Signed)
Sliding and small Unlikely to lead to any serious problems

## 2014-06-13 ENCOUNTER — Encounter: Payer: Self-pay | Admitting: Internal Medicine

## 2014-06-14 ENCOUNTER — Encounter: Payer: Self-pay | Admitting: Internal Medicine

## 2014-06-19 ENCOUNTER — Other Ambulatory Visit: Payer: Self-pay | Admitting: *Deleted

## 2014-06-19 ENCOUNTER — Encounter: Payer: Self-pay | Admitting: Internal Medicine

## 2014-06-19 MED ORDER — ONDANSETRON HCL 4 MG PO TABS
4.0000 mg | ORAL_TABLET | Freq: Three times a day (TID) | ORAL | Status: DC | PRN
Start: 2014-06-19 — End: 2018-03-06

## 2014-07-18 ENCOUNTER — Telehealth: Payer: Self-pay | Admitting: Cardiovascular Disease

## 2014-07-18 NOTE — Telephone Encounter (Signed)
Records received from [facility] placed in chart prep bin.

## 2014-07-19 ENCOUNTER — Encounter: Payer: Self-pay | Admitting: Internal Medicine

## 2014-07-19 ENCOUNTER — Ambulatory Visit (INDEPENDENT_AMBULATORY_CARE_PROVIDER_SITE_OTHER): Payer: BC Managed Care – PPO | Admitting: Internal Medicine

## 2014-07-19 VITALS — BP 114/68 | HR 80 | Ht 59.5 in | Wt 163.5 lb

## 2014-07-19 DIAGNOSIS — K209 Esophagitis, unspecified without bleeding: Secondary | ICD-10-CM

## 2014-07-19 DIAGNOSIS — K219 Gastro-esophageal reflux disease without esophagitis: Secondary | ICD-10-CM | POA: Diagnosis not present

## 2014-07-19 DIAGNOSIS — R1013 Epigastric pain: Secondary | ICD-10-CM

## 2014-07-19 NOTE — Patient Instructions (Addendum)
Purchase Gaviscon OTC and take 1-2 prn.  Dr Mattie Marlin

## 2014-07-19 NOTE — Progress Notes (Signed)
Sarah Cobb 1970/04/13 [id number]  Note: This dictation was prepared with Dragon digital system. Any transcriptional errors that result from this procedure are unintentional.   History of Present Illness: This is a 44 year old white female predents for follow-up visit after upper endoscopy on 06/25/2014 which showed grade 1 esophagitis, and 2-3 cm hiatal hernia. She was evaluated for epigastric and right upper quadrant abdominal pain as well as nausea. We have started Protonix 40 mg in the morning and ranitidine 300 mg at bedtime. She is markedly improved today. The abdominal pain has mostly subsided. She has about twice a week and episode of mild discomfort and proved. Upper abdominal ultrasound was negative. Liver function tests showed normal enzymes. She had a colonoscopy in 2013 for rectal bleeding and was found to have hemorrhoids and anal fissure.    Past Medical History  Diagnosis Date  . Depression   . Arrhythmia     Mitral valve regurgitation per pt- OV Dr Chancy Milroy, cardio 6/13- states had eccho and EKG  . Knee pain, right     arthrofibrosis rt knee  . Rosacea   . History of kidney stones     Past Surgical History  Procedure Laterality Date  . Wisdom tooth extraction    . Cryotherapy      CERVIX  . Colonoscopy  03/15/2011    NORMAL  . Orif patella  07/22/2011    Procedure: OPEN REDUCTION INTERNAL (ORIF) FIXATION PATELLA;  Surgeon: Johnn Hai, MD;  Location: [facility];  Service: Orthopedics;  Laterality: Right;  . Knee arthroscopy  02/03/2012    Procedure: ARTHROSCOPY KNEE;  Surgeon: Johnn Hai, MD;  Location: [facility];  Service: Orthopedics;  Laterality: Right;  Right Knee Arthroscopy with Manipulation Under Anesthesia/Evaluation Under Anesthesia/Debridement    Allergies  Allergen Reactions  . Valium Palpitations    MAKES HEART RACE    Family history and social history have been reviewed.  Review of Systems: Denies dysphagia. Currently denies nausea.  The  remainder of the 10 point ROS is negative except as outlined in the H&P  Physical Exam: General Appearance Well developed, in no distress, overweight Eyes  Non icteric  HEENT  Non traumatic, normocephalic  Mouth No lesion, tongue papillated, no cheilosis Neck Supple without adenopathy, thyroid not enlarged, no carotid bruits, no JVD Lungs Clear to auscultation bilaterally COR Normal S1, normal S2, regular rhythm, no murmur, quiet precordium Abdomen protuberant soft nontender with normoactive bowel sounds. I could not elicit any pain today not done Rectal not done Extremities  No pedal edema Skin No lesions Neurological Alert and oriented x 3 Psychological Normal mood and affect  Assessment and Plan:   44 year old white female with the gastroesophageal reflux disease and grade 1 esophagitis which has improved on combination of Protonix 40 mg am  and ranitidine 300 mg  Hs. She is following antireflux measures. She needs to lose weight. Her gallbladder studies are negative. She will continue on the same regimen and will try to reduce the Protonix if possible in the near future.  OV 1 year for refills.    Delfin Edis 07/19/2014

## 2014-08-22 ENCOUNTER — Telehealth: Payer: Self-pay | Admitting: *Deleted

## 2014-08-22 ENCOUNTER — Encounter: Payer: Self-pay | Admitting: Internal Medicine

## 2014-08-22 NOTE — Telephone Encounter (Signed)
Called and advised the patient I did a prior authorization on the Pantoprazole Sodium 40 mg , Lori Hvozdovic PA prescribed.  Dr. Olevia Perches also prescribed it in the past.  I advised the patient I will call her once I get the responose from [facility], [place].

## 2014-08-23 ENCOUNTER — Telehealth: Payer: Self-pay | Admitting: *Deleted

## 2014-08-23 MED ORDER — HYOSCYAMINE SULFATE 0.125 MG SL SUBL: 0.1250 mg | SUBLINGUAL_TABLET | SUBLINGUAL | Status: DC | PRN

## 2014-08-23 NOTE — Telephone Encounter (Signed)
Rx sent to pharmacy   

## 2014-08-23 NOTE — Telephone Encounter (Signed)
-----   Message from Algernon Huxley, RN sent at 08/23/2014  8:26 AM EDT ----- Regarding: FW: Levsin   ----- Message -----    From: Lafayette Dragon, MD    Sent: 08/22/2014   7:47 PM      To: Algernon Huxley, RN Subject: Sarah Cobb, please, send LevsinSL.125 mg, #30 1 sl q4 hrs prn a bd. Pain, 1 refill. Newman Nickels is working on preauthorization for  [employer] for this pt.

## 2014-08-29 ENCOUNTER — Telehealth: Payer: Self-pay | Admitting: *Deleted

## 2014-08-29 NOTE — Telephone Encounter (Signed)
Advised the patient that the prescription for Pantoprazole sodium 40 mg prescription was approved with [facility].  Good until 08-23-2015.  I also called [facility] [address] and they also were informed of the PA approval and filled the prescription.  Prior Authorization  case ID: [id number]. Sent aprroval letter, stamped and dated to Medical Records today 08-29-2014.

## 2014-08-30 NOTE — Telephone Encounter (Signed)
Advised patient the Prior authorization for Pantoprazole Sodium 40 mg was approved with [employer].  Pharmacy advised as well.

## 2014-09-01 ENCOUNTER — Encounter (HOSPITAL_COMMUNITY): Payer: Self-pay | Admitting: Emergency Medicine

## 2014-09-01 ENCOUNTER — Emergency Department (HOSPITAL_COMMUNITY): Payer: BC Managed Care – PPO

## 2014-09-01 ENCOUNTER — Emergency Department (HOSPITAL_COMMUNITY)
Admission: EM | Admit: 2014-09-01 | Discharge: 2014-09-01 | Disposition: A | Discharge status: Home / Self Care | Payer: BC Managed Care – PPO | Attending: Emergency Medicine | Admitting: Emergency Medicine

## 2014-09-01 DIAGNOSIS — Z87891 Personal history of nicotine dependence: Secondary | ICD-10-CM | POA: Diagnosis not present

## 2014-09-01 DIAGNOSIS — Z79899 Other long term (current) drug therapy: Secondary | ICD-10-CM | POA: Insufficient documentation

## 2014-09-01 DIAGNOSIS — F329 Major depressive disorder, single episode, unspecified: Secondary | ICD-10-CM | POA: Diagnosis not present

## 2014-09-01 DIAGNOSIS — R0602 Shortness of breath: Secondary | ICD-10-CM | POA: Diagnosis not present

## 2014-09-01 DIAGNOSIS — R110 Nausea: Secondary | ICD-10-CM | POA: Diagnosis not present

## 2014-09-01 DIAGNOSIS — Z87442 Personal history of urinary calculi: Secondary | ICD-10-CM | POA: Insufficient documentation

## 2014-09-01 DIAGNOSIS — Z8679 Personal history of other diseases of the circulatory system: Secondary | ICD-10-CM | POA: Diagnosis not present

## 2014-09-01 DIAGNOSIS — R0789 Other chest pain: Secondary | ICD-10-CM | POA: Diagnosis not present

## 2014-09-01 DIAGNOSIS — R079 Chest pain, unspecified: Secondary | ICD-10-CM | POA: Diagnosis present

## 2014-09-01 DIAGNOSIS — Z872 Personal history of diseases of the skin and subcutaneous tissue: Secondary | ICD-10-CM | POA: Diagnosis not present

## 2014-09-01 LAB — I-STAT TROPONIN, ED
TROPONIN I, POC: 0 ng/mL (ref 0.00–0.08)
Troponin i, poc: 0 ng/mL (ref 0.00–0.08)

## 2014-09-01 LAB — I-STAT CHEM 8, ED
BUN: 14 mg/dL (ref 6–20)
CALCIUM ION: 1.17 mmol/L (ref 1.12–1.23)
CHLORIDE: 103 mmol/L (ref 101–111)
Creatinine, Ser: 0.9 mg/dL (ref 0.44–1.00)
GLUCOSE: 90 mg/dL (ref 65–99)
HCT: 44 % (ref 36.0–46.0)
Hemoglobin: 15 g/dL (ref 12.0–15.0)
POTASSIUM: 4.2 mmol/L (ref 3.5–5.1)
Sodium: 140 mmol/L (ref 135–145)
TCO2: 23 mmol/L (ref 0–100)

## 2014-09-01 MED ORDER — OXYCODONE-ACETAMINOPHEN 5-325 MG PO TABS: 1.0000 | ORAL_TABLET | ORAL | Status: DC | PRN

## 2014-09-01 MED ORDER — GI COCKTAIL ~~LOC~~
30.0000 mL | Freq: Once | ORAL | Status: AC
  Administered 2014-09-01: 30 mL via ORAL
  Filled 2014-09-01: qty 30

## 2014-09-01 MED ORDER — ASPIRIN 81 MG PO CHEW
324.0000 mg | CHEWABLE_TABLET | Freq: Once | ORAL | Status: AC
  Administered 2014-09-01: 324 mg via ORAL
  Filled 2014-09-01: qty 4

## 2014-09-01 NOTE — ED Notes (Signed)
Pt wants to have labs drawn from IV ( repeat i stat Trop ) RN aware.

## 2014-09-01 NOTE — ED Notes (Signed)
Patient states that last night she started to have chest pain, pain radiated around to her back and under left breast. Patient states that she took gas-x last night and that did not help with the pain. This morning patient is still having chest pain that radiates down left arm, some nausea. Pain is not worse with movement.

## 2014-09-01 NOTE — Discharge Instructions (Signed)
Your chest pain today was thoroughly evaluated finding it to not be a heart attack. It is still possible that your symptoms from last night started due to inadequate blood flow to the heart at that time but without long term damage being inflicted. Therefore it is critical to follow up with your Cardiologist on 8/31 to be better evaluated. They make seek to arrange a cardiac stress study at that time to determine if you have inadequate blood flow to any portion of the heart.  Chest pain can also come from inflammation of the gastrointestinal tract. You are currently on appropriate medication for treating acid reflux but this does not eliminate the possibility of ongoing inflammation. Also avoid NSAIDS including ibuprofen, naproxen, or mobic as these are thin the lining of your stomach and increase the risk of pain and ulcers. Follow up with your PCP as needed if you continue to experience epigastric pain or heartburn despite your treatments.

## 2014-09-01 NOTE — ED Provider Notes (Signed)
CSN: [id number]     Arrival date & time 09/01/14  0831 History   First MD Initiated Contact with Patient 09/01/14 [phone]     Chief Complaint  Patient presents with  . Chest Pain   44 y/o woman presents with new onset of chest pain since approximately 10pm last night. The pain was retrosternal with radiation along her left side to the back. This started while using the computer at home and was accompanied with SOB, cold clammy skin, and a heart rate of 109 per her fitbit. Her symptoms improved within about 30 minutes besides her chest pain. She slept overnight and pain was present this morning with tingling pain in her left arm. She has felt nauseated during this time but did not vomit.  She recently started PPI treatment for GERD one month ago after office visit on 7/15. She also has a history of mitral regurgitation demonstrated on echocardiogram from 2013, and has an office appointment with her cardiologist scheduled for this Wednesday 8/31.  (Consider location/radiation/quality/duration/timing/severity/associated sxs/prior Treatment) Patient is a 44 y.o. female presenting with chest pain. The history is provided by the patient and the spouse.  Chest Pain Pain location:  Substernal area and L lateral chest Pain quality: radiating, sharp and tightness   Pain radiates to:  Mid back, L shoulder and L arm Pain radiates to the back: yes   Pain severity:  Moderate Onset quality:  Sudden Timing:  Constant Progression:  Partially resolved Chronicity:  New Context: at rest   Relieved by:  Rest Worsened by:  Nothing tried Ineffective treatments:  None tried Associated symptoms: diaphoresis, nausea and shortness of breath   Associated symptoms: no abdominal pain, no dizziness and not vomiting    Past Medical History  Diagnosis Date  . Depression   . Arrhythmia     Mitral valve regurgitation per pt- OV Dr Chancy Milroy, cardio 6/13- states had eccho and EKG  . Knee pain, right     arthrofibrosis rt  knee  . Rosacea   . History of kidney stones    Past Surgical History  Procedure Laterality Date  . Wisdom tooth extraction    . Cryotherapy      CERVIX  . Colonoscopy  03/15/2011    NORMAL  . Orif patella  07/22/2011    Procedure: OPEN REDUCTION INTERNAL (ORIF) FIXATION PATELLA;  Surgeon: Johnn Hai, MD;  Location: [facility];  Service: Orthopedics;  Laterality: Right;  . Knee arthroscopy  02/03/2012    Procedure: ARTHROSCOPY KNEE;  Surgeon: Johnn Hai, MD;  Location: [facility];  Service: Orthopedics;  Laterality: Right;  Right Knee Arthroscopy with Manipulation Under Anesthesia/Evaluation Under Anesthesia/Debridement   Family History  Problem Relation Age of Onset  . Adopted: Yes   Social History  Substance Use Topics  . Smoking status: Former Smoker    Types: Cigarettes    Quit date: 07/21/1998  . Smokeless tobacco: Never Used  . Alcohol Use: No     Comment: socially   OB History    No data available     Review of Systems  Constitutional: Positive for diaphoresis.  Respiratory: Positive for chest tightness and shortness of breath.   Cardiovascular: Positive for chest pain.  Gastrointestinal: Positive for nausea. Negative for vomiting and abdominal pain.  Neurological: Negative for dizziness and light-headedness.    Allergies  Valium  Home Medications   Prior to Admission medications   Medication Sig Start Date End Date Taking? Authorizing Provider  ALPRAZolam Duanne Moron) 0.25  MG tablet Take 0.25 mg by mouth 3 (three) times daily as needed for anxiety.  05/30/13  Yes Historical Provider, MD  doxycycline (ORACEA) 40 MG capsule Take 40 mg by mouth every morning.   Yes Historical Provider, MD  FINACEA 15 % FOAM Apply 1 application topically daily. 05/03/14  Yes Historical Provider, MD  hyoscyamine (LEVSIN/SL) 0.125 MG SL tablet Place 1 tablet (0.125 mg total) under the tongue every 4 (four) hours as needed. Patient taking differently: Place 0.125 mg under the tongue every  4 (four) hours as needed for cramping.  08/23/14  Yes Lafayette Dragon, MD  ibuprofen (ADVIL,MOTRIN) 200 MG tablet Take 400 mg by mouth every 6 (six) hours as needed for moderate pain.   Yes Historical Provider, MD  LO LOESTRIN FE 1 MG-10 MCG / 10 MCG tablet Take 1 tablet by mouth every morning.  05/25/13  Yes Historical Provider, MD  metaxalone (SKELAXIN) 800 MG tablet Take 800 mg by mouth as needed (for TMJ).   Yes Historical Provider, MD  Multiple Vitamins-Minerals (MULTIVITAMIN PO) Take 1 tablet by mouth daily. Pt takes Flintstones chewable tablet every day   Yes Historical Provider, MD  pantoprazole (PROTONIX) 40 MG tablet Take 1 tablet (40 mg total) by mouth daily. 05/29/14  Yes Lori P Hvozdovic, PA-C  ranitidine (ZANTAC) 300 MG tablet Take 1 tablet by mouth at bedtime. 06/26/14  Yes Historical Provider, MD  valACYclovir (VALTREX) 1000 MG tablet Take 1,000 mg by mouth as needed. For cold sores 05/23/14  Yes Historical Provider, MD  ondansetron (ZOFRAN) 4 MG tablet Take 1 tablet (4 mg total) by mouth every 8 (eight) hours as needed for nausea or vomiting. Patient not taking: Reported on 09/01/2014 06/19/14   Lafayette Dragon, MD   BP 116/79 mmHg  Pulse 74  Temp(Src) 98.7 F (37.1 C) (Oral)  Resp 18  SpO2 96% Physical Exam  Constitutional: She appears well-developed and well-nourished. No distress.  HENT:  Mouth/Throat: Oropharynx is clear and moist.  Cardiovascular: Normal rate, regular rhythm and intact distal pulses.   Murmur heard. Pulmonary/Chest: Effort normal and breath sounds normal. She exhibits no tenderness.  Abdominal: Soft. Bowel sounds are normal. There is no tenderness.  Musculoskeletal: She exhibits no edema.  Skin: She is not diaphoretic.    ED Course  Procedures (including critical care time) Labs Review Labs Reviewed  I-STAT CHEM 8, ED  I-STAT TROPOININ, ED  Randolm Idol, ED   Imaging Review Dg Chest Portable 1 View  09/01/2014   CLINICAL DATA:  Chest pain and  shortness of breath  EXAM: PORTABLE CHEST - 1 VIEW  COMPARISON:  05/24/2014  FINDINGS: The heart size and mediastinal contours are within normal limits. Both lungs are clear. The visualized skeletal structures are unremarkable.  IMPRESSION: No active disease.   Electronically Signed   By: Jerilynn Mages.  Shick M.D.   On: 09/01/2014 09:54   I have personally reviewed and evaluated these images and lab results as part of my medical decision-making.   EKG Interpretation   Date/Time:  Sunday September 01 2014 08:38:31 EDT Ventricular Rate:  90 PR Interval:  135 QRS Duration: 72 QT Interval:  355 QTC Calculation: 434 R Axis:   64 Text Interpretation:  Sinus rhythm Low voltage, precordial leads No  significant change since last tracing Confirmed by BEATON  MD, ROBERT  ([id number]) on 09/01/2014 9:09:55 AM      MDM   Final diagnoses:  Atypical chest pain   44 y/o woman presenting with  chest pain since last night. Cardiac history is mild mitral regurgitation identified 2013 on echocardiogram without known complication. Also has history of GERD with esophagitis on PPI therapy. Family history of cardiac disease is unknown due to she is adopted. History is moderately suspicious for ACS. EKG on presentation shows NSR. No abnormal findings on CXR. Chem panel is entirely WNL. Initial troponin 0.00. HEART score for major cardiac event is 2 with low risk of MACE.  Patient given GI cocktail with small improvement in symptoms. Held for observation and delta troponin x1. During observation nausea improved and tolerated some PO intake without vomiting. Repeat troponin 0.00. Patient discharged to home after review of managing GERD including not eating before sleep, appropriate scheduling of medication, and discontinuing home use of ibuprofen. She has an appointment with her cardiologist for Wednesday 8/31 and was instructed to attend this and discuss her ED visit here at that time, will defer outpatient cardiology workup to that  time.  Collier Salina, MD 09/01/14 1346  Leonard Schwartz, MD 09/20/14 [phone]

## 2014-09-02 ENCOUNTER — Encounter: Payer: Self-pay | Admitting: Internal Medicine

## 2014-09-04 ENCOUNTER — Ambulatory Visit: Payer: BC Managed Care – PPO | Admitting: Physician Assistant

## 2014-10-21 ENCOUNTER — Ambulatory Visit: Payer: BC Managed Care – PPO | Admitting: Physician Assistant

## 2014-10-31 ENCOUNTER — Ambulatory Visit: Payer: BC Managed Care – PPO | Admitting: Physician Assistant

## 2014-10-31 ENCOUNTER — Ambulatory Visit: Payer: BC Managed Care – PPO | Admitting: Gastroenterology

## 2014-11-01 ENCOUNTER — Telehealth: Payer: Self-pay | Admitting: *Deleted

## 2014-11-01 ENCOUNTER — Other Ambulatory Visit: Payer: Self-pay | Admitting: *Deleted

## 2014-11-01 MED ORDER — PANTOPRAZOLE SODIUM 40 MG PO TBEC: 40.0000 mg | DELAYED_RELEASE_TABLET | Freq: Every day | ORAL | Status: DC

## 2014-11-01 NOTE — Telephone Encounter (Signed)
Sent 90 day supply of the Pantoprazole sodium 40 mg by fax to [facility].  ( Sent electronically also under [employer] PA) Advised by fax and in [place], the patient needs to keep her appointment scheduled with Cecille Rubin Hvozdovic PA for 11-12-2014 at 8:45 am.

## 2014-11-12 ENCOUNTER — Ambulatory Visit (INDEPENDENT_AMBULATORY_CARE_PROVIDER_SITE_OTHER): Payer: BC Managed Care – PPO | Admitting: Physician Assistant

## 2014-11-12 ENCOUNTER — Encounter: Payer: Self-pay | Admitting: Physician Assistant

## 2014-11-12 VITALS — BP 118/70 | HR 72 | Ht 60.0 in | Wt 150.0 lb

## 2014-11-12 DIAGNOSIS — K219 Gastro-esophageal reflux disease without esophagitis: Secondary | ICD-10-CM | POA: Diagnosis not present

## 2014-11-12 DIAGNOSIS — K589 Irritable bowel syndrome without diarrhea: Secondary | ICD-10-CM

## 2014-11-12 DIAGNOSIS — R1011 Right upper quadrant pain: Secondary | ICD-10-CM

## 2014-11-12 NOTE — Patient Instructions (Addendum)
Continue Pantoprazole 40 mg and Ranitidine as directed.  Use the Levsin every 4 hours while awake. Let us know if it helps.  You have been scheduled for a HIDA scan at [facility] (1st floor) on 11-20-2015. Please arrive at 12:45 PM prior to your scheduled appointment at  3:11 PM. Make certain not to have anything to eat or drink at least 6 hours prior to your test. Should this appointment date or time not work well for you, please call radiology scheduling at [phone].  _____________________________________________________________________ hepatobiliary (HIDA) scan is an imaging procedure used to diagnose problems in the liver, gallbladder and bile ducts. In the HIDA scan, a radioactive chemical or tracer is injected into a vein in your arm. The tracer is handled by the liver like bile. Bile is a fluid produced and excreted by your liver that helps your digestive system break down fats in the foods you eat. Bile is stored in your gallbladder and the gallbladder releases the bile when you eat a meal. A special nuclear medicine scanner (gamma camera) tracks the flow of the tracer from your liver into your gallbladder and small intestine.  During your HIDA scan  You'll be asked to change into a hospital gown before your HIDA scan begins. Your health care team will position you on a table, usually on your back. The radioactive tracer is then injected into a vein in your arm.The tracer travels through your bloodstream to your liver, where it's taken up by the bile-producing cells. The radioactive tracer travels with the bile from your liver into your gallbladder and through your bile ducts to your small intestine.You may feel some pressure while the radioactive tracer is injected into your vein. As you lie on the table, a special gamma camera is positioned over your abdomen taking pictures of the tracer as it moves through your body. The gamma camera takes pictures continually for about an hour.  You'll need to keep still during the HIDA scan. This can become uncomfortable, but you may find that you can lessen the discomfort by taking deep breaths and thinking about other things. Tell your health care team if you're uncomfortable. The radiologist will watch on a computer the progress of the radioactive tracer through your body. The HIDA scan may be stopped when the radioactive tracer is seen in the gallbladder and enters your small intestine. This typically takes about an hour. In some cases extra imaging will be performed if original images aren't satisfactory, if morphine is given to help visualize the gallbladder or if the medication CCK is given to look at the contraction of the gallbladder. This test typically takes 2 hours to complete. ________________________________________________________________________

## 2014-11-12 NOTE — Progress Notes (Signed)
Patient ID: Sarah Cobb, female   DOB: 11/12/1970, 44 y.o.   MRN: [id number]     History of Present Illness: Sarah Cobb is a pleasant 44 year old female who has previously been followed by Dr. Sharlett Iles and Dr. Maurene Capes. She had a colonoscopy in March 2013 by Dr. Sharlett Iles at which time she was noted to have anal fissures.  She was evaluated in May 2016 with complaints of intermittent epigastric and right upper quadrant pain since February. She was evaluated by her PCP and had an abdominal ultrasound which was normal and blood work which was normal. She reported epigastric pain that was worse on an empty stomach and alleviated with ingestion of food. She had daily heartburn. She was started on Zantac which provided some relief but she continued to get heartburn on a daily basis. She was given a trial of proton next 40 mg by mouth every morning which significantly helped her heartburn. She was noted to have fatty liver and was advised to lose weight. She was scheduled for an EGD which she had performed by Dr. Maurene Capes on 06/25/2014. This study showed grade 1 esophagitis and a 2-3 cm hiatal hernia. She was seen in follow-up by Dr. Maurene Capes in July at which time she was advised to continue proton next in follow-up in one year. She returns today with recurrent complaints of intermittent right upper quadrant pain. It sometimes occurs postprandially. It is not associated with nausea but feels like someone has punched her in the right upper quadrant. It is not alleviated with passage of gas or defecation. She has had no change in her bowel habits or stool caliber, and she states that for years her stools alternate between days of hard stools and days of loose stools.   Past Medical History  Diagnosis Date  . Depression   . Arrhythmia     Mitral valve regurgitation per pt- OV Dr Chancy Milroy, cardio 6/13- states had eccho and EKG  . Knee pain, right     arthrofibrosis rt knee  . Rosacea   . History of kidney stones       Past Surgical History  Procedure Laterality Date  . Wisdom tooth extraction    . Cryotherapy      CERVIX  . Colonoscopy  03/15/2011    NORMAL  . Orif patella  07/22/2011    Procedure: OPEN REDUCTION INTERNAL (ORIF) FIXATION PATELLA;  Surgeon: Johnn Hai, MD;  Location: [facility];  Service: Orthopedics;  Laterality: Right;  . Knee arthroscopy  02/03/2012    Procedure: ARTHROSCOPY KNEE;  Surgeon: Johnn Hai, MD;  Location: [facility];  Service: Orthopedics;  Laterality: Right;  Right Knee Arthroscopy with Manipulation Under Anesthesia/Evaluation Under Anesthesia/Debridement   Family History  Problem Relation Age of Onset  . Adopted: Yes   Social History  Substance Use Topics  . Smoking status: Former Smoker    Types: Cigarettes    Quit date: 07/21/1998  . Smokeless tobacco: Never Used  . Alcohol Use: No     Comment: socially   Current Outpatient Prescriptions  Medication Sig Dispense Refill  . ALPRAZolam (XANAX) 0.25 MG tablet Take 0.25 mg by mouth 3 (three) times daily as needed for anxiety.     Marland Kitchen doxycycline (ORACEA) 40 MG capsule Take 40 mg by mouth every morning.    Marland Kitchen FINACEA 15 % FOAM Apply 1 application topically daily.    . hyoscyamine (LEVSIN/SL) 0.125 MG SL tablet Place 1 tablet (0.125 mg total) under the tongue every 4 (  four) hours as needed. (Patient taking differently: Place 0.125 mg under the tongue every 4 (four) hours as needed for cramping. ) 30 tablet 1  . ibuprofen (ADVIL,MOTRIN) 200 MG tablet Take 400 mg by mouth every 6 (six) hours as needed for moderate pain.    . LO LOESTRIN FE 1 MG-10 MCG / 10 MCG tablet Take 1 tablet by mouth every morning.     . metaxalone (SKELAXIN) 800 MG tablet Take 800 mg by mouth as needed (for TMJ).    . Multiple Vitamins-Minerals (MULTIVITAMIN PO) Take 1 tablet by mouth daily. Pt takes Flintstones chewable tablet every day    . ondansetron (ZOFRAN) 4 MG tablet Take 1 tablet (4 mg total) by mouth every 8 (eight) hours as needed  for nausea or vomiting. 30 tablet 1  . pantoprazole (PROTONIX) 40 MG tablet Take 1 tablet (40 mg total) by mouth daily. 90 tablet 3  . ranitidine (ZANTAC) 300 MG tablet Take 1 tablet by mouth at bedtime.    . valACYclovir (VALTREX) 1000 MG tablet Take 1,000 mg by mouth as needed. For cold sores  0   No current facility-administered medications for this visit.   Allergies  Allergen Reactions  . Valium Palpitations    MAKES HEART RACE     Review of Systems: Gen: Denies any fever, chills, sweats, anorexia, fatigue, weakness, malaise, weight loss, and sleep disorder CV: Denies chest pain, angina, palpitations, syncope, orthopnea, PND, peripheral edema, and claudication. Resp: Denies dyspnea at rest, dyspnea with exercise, cough, sputum, wheezing, coughing up blood, and pleurisy. GI: Denies vomiting blood, jaundice, and fecal incontinence.   Denies dysphagia or odynophagia. GU : Denies urinary burning, blood in urine, urinary frequency, urinary hesitancy, nocturnal urination, and urinary incontinence. MS: Denies joint pain, limitation of movement, and swelling, stiffness, low back pain, extremity pain. Denies muscle weakness, cramps, atrophy.  Derm: Denies rash, itching, dry skin, hives, moles, warts, or unhealing ulcers.  Psych: Denies depression, anxiety, memory loss, suicidal ideation, hallucinations, paranoia, and confusion. Heme: Denies bruising, bleeding, and enlarged lymph nodes. Neuro:  Denies any headaches, dizziness, paresthesia Endo:  Denies any problems with DM, thyroid, adrenal    Physical Exam: BP 118/70 mmHg  Pulse 72  Ht 5' (1.524 m)  Wt 150 lb (68.04 kg)  BMI 29.30 kg/m2 General: Pleasant, well developed , Caucasian female in no acute distress Head: Normocephalic and atraumatic Eyes:  sclerae anicteric, conjunctiva pink  Ears: Normal auditory acuity Lungs: Clear throughout to auscultation Heart: Regular rate and rhythm Abdomen: Soft, non distended, non-tender. No  masses, no hepatomegaly. Normal bowel sound Musculoskeletal: Symmetrical with no gross deformities  Extremities: No edema  Neurological: Alert oriented x 4, grossly nonfocal Psychological:  Alert and cooperative. Normal mood and affect  Assessment and Recommendations: #1 GERD. An antireflux regimen has been reviewed. She will continue Protonix 40 mg 1 by mouth U a.m. 30 minutes to breakfast.  #2. Right upper quadrant pain. This may possibly be due to colon spasm or possibly gallbladder issues. Abdominal ultrasound and LFTs have been nonrevealing. EGD showed mild esophagitis.Marland Kitchen She will be scheduled for a HIDA scan. She has also been instructed to try to use her Levsin more regularly to see if it will help, as she is only used it once or twice and she filled the prescription.  #3. Fatty liver. She will continue to try to lose weight.  Follow-up recommendations will be made pending the findings of the above. She will follow up in January at which  time she would like to establish care with Dr. Silverio Decamp.        Aydin Cavalieri, Vita Barley PA-C 11/12/2014,

## 2014-11-15 NOTE — Progress Notes (Signed)
Reviewed and agree with documentation and assessment and plan. K. Veena Jakaden Ouzts , MD   

## 2014-11-20 ENCOUNTER — Encounter (HOSPITAL_COMMUNITY): Payer: BC Managed Care – PPO

## 2014-11-25 ENCOUNTER — Encounter (HOSPITAL_COMMUNITY)
Admission: RE | Admit: 2014-11-25 | Discharge: 2014-11-25 | Disposition: A | Discharge status: Home / Self Care | Payer: BC Managed Care – PPO | Source: Ambulatory Visit | Attending: Physician Assistant | Admitting: Physician Assistant

## 2014-11-25 ENCOUNTER — Encounter: Payer: Self-pay | Admitting: *Deleted

## 2014-11-25 ENCOUNTER — Encounter: Payer: Self-pay | Admitting: Physician Assistant

## 2014-11-25 DIAGNOSIS — R1011 Right upper quadrant pain: Secondary | ICD-10-CM

## 2014-11-25 DIAGNOSIS — K589 Irritable bowel syndrome without diarrhea: Secondary | ICD-10-CM | POA: Diagnosis present

## 2014-11-25 DIAGNOSIS — K219 Gastro-esophageal reflux disease without esophagitis: Secondary | ICD-10-CM

## 2014-11-25 MED ORDER — SINCALIDE 5 MCG IJ SOLR
0.0200 ug/kg | Freq: Once | INTRAMUSCULAR | Status: AC
  Administered 2014-11-25: 1.4 ug via INTRAVENOUS

## 2014-11-25 MED ORDER — TECHNETIUM TC 99M MEBROFENIN IV KIT
5.2000 | PACK | Freq: Once | INTRAVENOUS | Status: DC | PRN
  Administered 2014-11-25: 5.2 via INTRAVENOUS
  Filled 2014-11-25: qty 6

## 2014-12-03 ENCOUNTER — Other Ambulatory Visit: Payer: Self-pay | Admitting: General Surgery

## 2014-12-03 ENCOUNTER — Encounter: Payer: Self-pay | Admitting: Physician Assistant

## 2014-12-25 ENCOUNTER — Encounter (HOSPITAL_COMMUNITY): Payer: Self-pay

## 2014-12-25 ENCOUNTER — Encounter (HOSPITAL_COMMUNITY)
Admission: RE | Admit: 2014-12-25 | Discharge: 2014-12-25 | Disposition: A | Discharge status: Home / Self Care | Payer: BC Managed Care – PPO | Source: Ambulatory Visit | Attending: General Surgery | Admitting: General Surgery

## 2014-12-25 DIAGNOSIS — Z01812 Encounter for preprocedural laboratory examination: Secondary | ICD-10-CM | POA: Diagnosis not present

## 2014-12-25 DIAGNOSIS — Z01818 Encounter for other preprocedural examination: Secondary | ICD-10-CM | POA: Diagnosis not present

## 2014-12-25 DIAGNOSIS — Z87891 Personal history of nicotine dependence: Secondary | ICD-10-CM | POA: Diagnosis not present

## 2014-12-25 DIAGNOSIS — K219 Gastro-esophageal reflux disease without esophagitis: Secondary | ICD-10-CM | POA: Diagnosis not present

## 2014-12-25 DIAGNOSIS — I340 Nonrheumatic mitral (valve) insufficiency: Secondary | ICD-10-CM | POA: Diagnosis not present

## 2014-12-25 HISTORY — DX: Gastro-esophageal reflux disease without esophagitis: K21.9

## 2014-12-25 HISTORY — DX: Family history of other specified conditions: Z84.89

## 2014-12-25 HISTORY — DX: Personal history of other diseases of the digestive system: Z87.19

## 2014-12-25 LAB — URINALYSIS, ROUTINE W REFLEX MICROSCOPIC
Bilirubin Urine: NEGATIVE
Glucose, UA: NEGATIVE mg/dL
Ketones, ur: NEGATIVE mg/dL
Leukocytes, UA: NEGATIVE
Nitrite: NEGATIVE
PH: 5 (ref 5.0–8.0)
Protein, ur: NEGATIVE mg/dL
SPECIFIC GRAVITY, URINE: 1.021 (ref 1.005–1.030)

## 2014-12-25 LAB — CBC WITH DIFFERENTIAL/PLATELET
BASOS ABS: 0 10*3/uL (ref 0.0–0.1)
Basophils Relative: 0 %
EOS ABS: 0.2 10*3/uL (ref 0.0–0.7)
EOS PCT: 2 %
HCT: 40.9 % (ref 36.0–46.0)
Hemoglobin: 14.1 g/dL (ref 12.0–15.0)
LYMPHS PCT: 25 %
Lymphs Abs: 2.7 10*3/uL (ref 0.7–4.0)
MCH: 31.9 pg (ref 26.0–34.0)
MCHC: 34.5 g/dL (ref 30.0–36.0)
MCV: 92.5 fL (ref 78.0–100.0)
Monocytes Absolute: 0.4 10*3/uL (ref 0.1–1.0)
Monocytes Relative: 4 %
NEUTROS PCT: 69 %
Neutro Abs: 7.5 10*3/uL (ref 1.7–7.7)
PLATELETS: 250 10*3/uL (ref 150–400)
RBC: 4.42 MIL/uL (ref 3.87–5.11)
RDW: 12.6 % (ref 11.5–15.5)
WBC: 10.8 10*3/uL — AB (ref 4.0–10.5)

## 2014-12-25 LAB — URINE MICROSCOPIC-ADD ON

## 2014-12-25 LAB — COMPREHENSIVE METABOLIC PANEL
ALT: 40 U/L (ref 14–54)
AST: 26 U/L (ref 15–41)
Albumin: 4.1 g/dL (ref 3.5–5.0)
Alkaline Phosphatase: 70 U/L (ref 38–126)
Anion gap: 11 (ref 5–15)
BUN: 12 mg/dL (ref 6–20)
CHLORIDE: 103 mmol/L (ref 101–111)
CO2: 25 mmol/L (ref 22–32)
CREATININE: 0.82 mg/dL (ref 0.44–1.00)
Calcium: 9.7 mg/dL (ref 8.9–10.3)
GFR calc Af Amer: >60 mL/min (ref >60)
GFR calc non Af Amer: >60 mL/min (ref >60)
Glucose, Bld: 99 mg/dL (ref 65–99)
Potassium: 4 mmol/L (ref 3.5–5.1)
SODIUM: 139 mmol/L (ref 135–145)
Total Bilirubin: 0.5 mg/dL (ref 0.3–1.2)
Total Protein: 7 g/dL (ref 6.5–8.1)

## 2014-12-25 NOTE — Pre-Procedure Instructions (Signed)
Sarah Cobb  12/25/2014      [id number] [phone] [place] [id number]: [phone]: [phone]   Your procedure is scheduled on Thursday, Dec. 29th   [facility]  Call this number if you have problems the morning of surgery:  [phone]   Remember:  Do not eat food or drink liquids after midnight Wednesday.  Take these medicines the morning of surgery with A SIP OF WATER : Xanax, Skelaxin, Protonix   Do not wear jewelry, make-up or nail polish.  Do not wear lotions, powders, or perfumes.  You may NOT wear deodorant the day of surgery.  Do not shave underarms & legs 48 hours prior to surgery.     Do not bring valuables to the hospital.  [facility] or [facility].  Contacts, dentures or bridgework may not be worn into surgery.  Leave your suitcase in the car.  After surgery it may be brought to your room. For patients admitted to the hospital, discharge time will be determined by your treatment team.  Name and phone number of your driver:     Please read over the following fact sheets that you were given. Pain Booklet, Coughing and Deep Breathing and Surgical Site Infection Prevention

## 2014-12-25 NOTE — Progress Notes (Addendum)
Patient was told about 5 yrs ago, that she had mitral valve regurg.  Went to see Dr. Drue Novel stress test and imaging @ [place]. [id number] [phone] 3 yrs ago had echo. Dr. Humphrey Rolls informed her that she had slight regurg.  Went back yrly for f/u.  Went to see Dr. Einar Gip in Aug.  He heard nothing, so no tests done. (HAVE REQUESTED ANY TEST AND OFFICE NOTES) . Denies any chest pain, SOB.

## 2014-12-25 NOTE — Progress Notes (Addendum)
Anesthesia Chart Review:  Pt is 44 year old female scheduled for laparoscopic cholecystectomy with intraoperative cholangiogram on 01/02/2015 with Dr. Barry Dienes.   PMH includes:  MV regurgitation, GERD. Former smoker. BMI 29. S/p R knee arthroscopy 02/03/12. S/p ORIF R patella 07/22/11.   Medications include: protonix.   Preoperative labs reviewed.    Chest x-ray 05/24/14 reviewed. No acute cardiopulmonary disease.   EKG 09/01/14: Sinus rhythm. Low voltage, precordial leads  Echo 06/27/14:  - Normal chamber size - Normal LV systolic function - Normal wall motion - Mild LVH - Normal pulmonary artery pressure - Trace MR - no pericardial perfusion  Nuclear stress test 03/14/09: Normal study, normal EF 88%  Holter monitor 02/12/09: NSR. No significant arrhythmia.   If no changes, I anticipate pt can proceed with surgery as scheduled.   Willeen Cass, FNP-BC [facility]/Anesthesiology Phone: [phone] 12/25/2014 2:57 PM

## 2015-01-01 MED ORDER — CEFAZOLIN SODIUM-DEXTROSE 2-3 GM-% IV SOLR
2.0000 g | INTRAVENOUS | Status: AC
  Administered 2015-01-02: 2 g via INTRAVENOUS
  Filled 2015-01-01: qty 50

## 2015-01-01 NOTE — H&P (Signed)
Sarah Cobb Location: [facility] Patient #: [id number] DOB: [birth year] Married / Language: English / Race: White Female   History of Present Illness   The patient is a 44 year old female who presents for evaluation of gall stones. The patient is here as a consultation at the request of Dr. Silverio Decamp for evaluation of biliary dyskinesia. Patient is a 44 year old female who has been having abdominal pain on and off for 6 months. Her primary care physician ordered a right upper quadrant ultrasound and LFTs. These were normal. She saw Dr. Maurene Capes of GI and had an endoscopy and started on proton pump inhibitors. She was seen to have some esophagitis and mild gastritis. This has seemed to help significantly but she continues to have occasional episodes of pain which interferes with sleep. She occasionally has these episodes postprandially, but not always. She has seemed to notice fewer episodes of pain after changing her diet to be more low fat. She also has had some improvement after being on a Z-Pak for sinusitis. A HIDA scan demonstrates biliary dyskinesia with gallbladder ejection fraction of 33.8% with normal being 38%. She does have alternating constipation and diarrhea. She does not have any evidence of gallstones on any of her studies. She has had an MRI as well. This was also unrevealing.   Other Problems Anxiety Disorder Back Pain Depression Gastroesophageal Reflux Disease  Past Surgical History  Knee Surgery Right.  Diagnostic Studies History  Colonoscopy 1-5 years ago Mammogram within last year Pap Smear 1-5 years ago  Allergies  Valium *ANTIANXIETY AGENTS*  Medication History ALPRAZolam (0.25MG Tablet, Oral) Active. Lo Loestrin Fe (1 MG-10 MCG /10 MCG Tablet, Oral) Active. Hyoscyamine Sulfate (0.125MG Tab Sublingual, Sublingual) Active. Pantoprazole Sodium (40MG Tablet DR, Oral) Active. RaNITidine HCl (300MG Tablet, Oral)  Active. Medications Reconciled  Social History  Alcohol use Occasional alcohol use. Caffeine use Carbonated beverages, Tea. No drug use Tobacco use Former smoker.  Family History  Family history unknown First Degree Relatives  Pregnancy / Birth History  Age at menarche 67 years. Contraceptive History Oral contraceptives. Gravida 1 Maternal age 69-40 Para 0    Review of Systems General Present- Fatigue. Not Present- Appetite Loss, Chills, Fever, Night Sweats, Weight Gain and Weight Loss. Skin Not Present- Change in Wart/Mole, Dryness, Hives, Jaundice, New Lesions, Non-Healing Wounds, Rash and Ulcer. HEENT Present- Sinus Pain. Not Present- Earache, Hearing Loss, Hoarseness, Nose Bleed, Oral Ulcers, Ringing in the Ears, Seasonal Allergies, Sore Throat, Visual Disturbances, Wears glasses/contact lenses and Yellow Eyes. Respiratory Present- Chronic Cough. Not Present- Bloody sputum, Difficulty Breathing, Snoring and Wheezing. Breast Not Present- Breast Mass, Breast Pain, Nipple Discharge and Skin Changes. Cardiovascular Not Present- Chest Pain, Difficulty Breathing Lying Down, Leg Cramps, Palpitations, Rapid Heart Rate, Shortness of Breath and Swelling of Extremities. Gastrointestinal Present- Nausea. Not Present- Abdominal Pain, Bloating, Bloody Stool, Change in Bowel Habits, Chronic diarrhea, Constipation, Difficulty Swallowing, Excessive gas, Gets full quickly at meals, Hemorrhoids, Indigestion, Rectal Pain and Vomiting. Female Genitourinary Not Present- Frequency, Nocturia, Painful Urination, Pelvic Pain and Urgency. Musculoskeletal Present- Back Pain. Not Present- Joint Pain, Joint Stiffness, Muscle Pain, Muscle Weakness and Swelling of Extremities. Neurological Not Present- Decreased Memory, Fainting, Headaches, Numbness, Seizures, Tingling, Tremor, Trouble walking and Weakness. Psychiatric Present- Anxiety and Depression. Not Present- Bipolar, Change in Sleep Pattern,  Fearful and Frequent crying. Endocrine Not Present- Cold Intolerance, Excessive Hunger, Hair Changes, Heat Intolerance, Hot flashes and New Diabetes. Hematology Not Present- Easy Bruising, Excessive bleeding, Gland problems, HIV and  Persistent Infections.  Vitals  Weight: 147 lb Height: 60in Body Surface Area: 1.64 m Body Mass Index: 28.71 kg/m  Temp.: 97.35F(Temporal)  Pulse: 81 (Regular)  BP: 126/74 (Sitting, Left Arm, Standard)    Physical Exam General Mental Status-Alert. General Appearance-Consistent with stated age. Hydration-Well hydrated. Voice-Normal.  Head and Neck Head-normocephalic, atraumatic with no lesions or palpable masses. Trachea-midline. Thyroid Gland Characteristics - normal size and consistency.  Eye Eyeball - Bilateral-Extraocular movements intact. Sclera/Conjunctiva - Bilateral-No scleral icterus.  Chest and Lung Exam Chest and lung exam reveals -quiet, even and easy respiratory effort with no use of accessory muscles and on auscultation, normal breath sounds, no adventitious sounds and normal vocal resonance. Inspection Chest Wall - Normal. Back - normal.  Cardiovascular Cardiovascular examination reveals -normal heart sounds, regular rate and rhythm with no murmurs and normal pedal pulses bilaterally.  Abdomen Inspection Inspection of the abdomen reveals - No Hernias. Palpation/Percussion Palpation and Percussion of the abdomen reveal - Soft, Non Tender, No Rebound tenderness, No Rigidity (guarding) and No hepatosplenomegaly. Auscultation Auscultation of the abdomen reveals - Bowel sounds normal.  Neurologic Neurologic evaluation reveals -alert and oriented x 3 with no impairment of recent or remote memory. Mental Status-Normal.  Musculoskeletal Global Assessment -Note: no gross deformities.  Normal Exam - Left-Upper Extremity Strength Normal and Lower Extremity Strength Normal. Normal Exam -  Right-Upper Extremity Strength Normal and Lower Extremity Strength Normal.  Lymphatic Head & Neck  General Head & Neck Lymphatics: Bilateral - Description - Normal. Axillary  General Axillary Region: Bilateral - Description - Normal. Tenderness - Non Tender. Femoral & Inguinal  Generalized Femoral & Inguinal Lymphatics: Bilateral - Description - No Generalized lymphadenopathy.    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 She has had improvement with a low-fat diet. She was given an Neurosurgeon about gallbladder surgery. I discussed the present cons of surgery including the possibility of her symptoms not all improving. I also discussed bile leak. She is going to review things and discussed this with her husband and give [place] a call.  The surgical procedure was described to the patient in detail. The patient was given educational material. I discussed the incision type and location, the location of the gallbladder, the anatomy of the bile ducts and arteries, and the typical progression of surgery. I discussed the possibility of converting to an open operation. I advised of the risks of bleeding, infection, damage to other structures (such as the bile duct, intestine or liver), bile leak, need for other procedures or surgeries, and post op diarrhea/constipation. We discussed the risk of blood clot. We discussed the recovery period and post operative restrictions. The patient was advised against taking blood thinners the week before surgery. Current Plans Pt Education - Laparoscopic Cholecystectomy: gallbladder You are being scheduled for surgery - Our schedulers will call you.  You should hear from our office's scheduling department within 5 working days about the location, date, and time of surgery. We try to make accommodations for patient's preferences in scheduling surgery, but  sometimes the OR schedule or the surgeon's schedule prevents [place] from making those accommodations.  If you have not heard from our office [phone]) in 5 working days, call the office and ask for your surgeon's nurse.  If you have other questions about your diagnosis, plan, or surgery, call the office and ask for your surgeon's nurse.    Signed by  Stark Klein, MD

## 2015-01-02 ENCOUNTER — Encounter (HOSPITAL_COMMUNITY)
Admission: RE | Disposition: A | Discharge status: Home / Self Care | Payer: Self-pay | Source: Ambulatory Visit | Attending: General Surgery

## 2015-01-02 ENCOUNTER — Ambulatory Visit (HOSPITAL_COMMUNITY): Payer: BC Managed Care – PPO | Admitting: Anesthesiology

## 2015-01-02 ENCOUNTER — Encounter (HOSPITAL_COMMUNITY): Payer: Self-pay | Admitting: Certified Registered Nurse Anesthetist

## 2015-01-02 ENCOUNTER — Ambulatory Visit (HOSPITAL_COMMUNITY): Payer: BC Managed Care – PPO | Admitting: Emergency Medicine

## 2015-01-02 ENCOUNTER — Ambulatory Visit (HOSPITAL_COMMUNITY)
Admission: RE | Admit: 2015-01-02 | Discharge: 2015-01-02 | Disposition: A | Discharge status: Home / Self Care | Payer: BC Managed Care – PPO | Source: Ambulatory Visit | Attending: General Surgery | Admitting: General Surgery

## 2015-01-02 DIAGNOSIS — F329 Major depressive disorder, single episode, unspecified: Secondary | ICD-10-CM | POA: Diagnosis not present

## 2015-01-02 DIAGNOSIS — K219 Gastro-esophageal reflux disease without esophagitis: Secondary | ICD-10-CM | POA: Insufficient documentation

## 2015-01-02 DIAGNOSIS — K811 Chronic cholecystitis: Secondary | ICD-10-CM | POA: Diagnosis not present

## 2015-01-02 DIAGNOSIS — F419 Anxiety disorder, unspecified: Secondary | ICD-10-CM | POA: Diagnosis not present

## 2015-01-02 DIAGNOSIS — Z87891 Personal history of nicotine dependence: Secondary | ICD-10-CM | POA: Insufficient documentation

## 2015-01-02 DIAGNOSIS — K828 Other specified diseases of gallbladder: Secondary | ICD-10-CM | POA: Insufficient documentation

## 2015-01-02 DIAGNOSIS — Z79899 Other long term (current) drug therapy: Secondary | ICD-10-CM | POA: Diagnosis not present

## 2015-01-02 HISTORY — PX: CHOLECYSTECTOMY: SHX55

## 2015-01-02 SURGERY — LAPAROSCOPIC CHOLECYSTECTOMY WITH INTRAOPERATIVE CHOLANGIOGRAM
Anesthesia: General | Site: Abdomen

## 2015-01-02 MED ORDER — FENTANYL CITRATE (PF) 250 MCG/5ML IJ SOLN
INTRAMUSCULAR | Status: AC
  Filled 2015-01-02: qty 5

## 2015-01-02 MED ORDER — PROPOFOL 10 MG/ML IV BOLUS
INTRAVENOUS | Status: DC | PRN
  Administered 2015-01-02: 150 mg via INTRAVENOUS

## 2015-01-02 MED ORDER — PHENYLEPHRINE HCL 10 MG/ML IJ SOLN
INTRAMUSCULAR | Status: DC | PRN
  Administered 2015-01-02 (×2): 80 ug via INTRAVENOUS

## 2015-01-02 MED ORDER — SUGAMMADEX SODIUM 500 MG/5ML IV SOLN
INTRAVENOUS | Status: DC | PRN
Start: 2015-01-02 — End: 2015-01-02
  Administered 2015-01-02: 300 mg via INTRAVENOUS

## 2015-01-02 MED ORDER — MIDAZOLAM HCL 5 MG/5ML IJ SOLN
INTRAMUSCULAR | Status: DC | PRN
  Administered 2015-01-02: 2 mg via INTRAVENOUS

## 2015-01-02 MED ORDER — FENTANYL CITRATE (PF) 100 MCG/2ML IJ SOLN
25.0000 ug | INTRAMUSCULAR | Status: DC | PRN
  Administered 2015-01-02 (×4): 25 ug via INTRAVENOUS

## 2015-01-02 MED ORDER — LIDOCAINE HCL (CARDIAC) 20 MG/ML IV SOLN
INTRAVENOUS | Status: DC | PRN
  Administered 2015-01-02: 80 mg via INTRAVENOUS

## 2015-01-02 MED ORDER — SODIUM CHLORIDE 0.9 % IR SOLN
Status: DC | PRN
  Administered 2015-01-02: 1000 mL

## 2015-01-02 MED ORDER — LIDOCAINE HCL 1 % IJ SOLN
INTRAMUSCULAR | Status: DC | PRN
  Administered 2015-01-02: 13:00:00 via INTRAMUSCULAR
  Administered 2015-01-02: 5 mL via INTRAMUSCULAR

## 2015-01-02 MED ORDER — OXYCODONE-ACETAMINOPHEN 5-325 MG PO TABS
ORAL_TABLET | ORAL | Status: AC
  Filled 2015-01-02: qty 1

## 2015-01-02 MED ORDER — FENTANYL CITRATE (PF) 100 MCG/2ML IJ SOLN
INTRAMUSCULAR | Status: DC | PRN
  Administered 2015-01-02: 50 ug via INTRAVENOUS
  Administered 2015-01-02: 100 ug via INTRAVENOUS
  Administered 2015-01-02: 50 ug via INTRAVENOUS

## 2015-01-02 MED ORDER — ONDANSETRON HCL 4 MG/2ML IJ SOLN
INTRAMUSCULAR | Status: DC | PRN
  Administered 2015-01-02: 4 mg via INTRAVENOUS

## 2015-01-02 MED ORDER — MIDAZOLAM HCL 2 MG/2ML IJ SOLN
INTRAMUSCULAR | Status: AC
  Filled 2015-01-02: qty 2

## 2015-01-02 MED ORDER — OXYCODONE HCL 5 MG PO TABS
5.0000 mg | ORAL_TABLET | ORAL | Status: DC | PRN
  Administered 2015-01-02: 5 mg via ORAL

## 2015-01-02 MED ORDER — LIDOCAINE HCL 1 % IJ SOLN
INTRAMUSCULAR | Status: DC | PRN
  Administered 2015-01-02: 5 mL

## 2015-01-02 MED ORDER — BUPIVACAINE-EPINEPHRINE (PF) 0.25% -1:200000 IJ SOLN
INTRAMUSCULAR | Status: AC
  Filled 2015-01-02: qty 30

## 2015-01-02 MED ORDER — OXYCODONE HCL 5 MG PO TABS
ORAL_TABLET | ORAL | Status: AC
  Filled 2015-01-02: qty 1

## 2015-01-02 MED ORDER — LACTATED RINGERS IV SOLN
INTRAVENOUS | Status: DC
  Administered 2015-01-02: 12:00:00 via INTRAVENOUS

## 2015-01-02 MED ORDER — 0.9 % SODIUM CHLORIDE (POUR BTL) OPTIME
TOPICAL | Status: DC | PRN
  Administered 2015-01-02: 1000 mL

## 2015-01-02 MED ORDER — FENTANYL CITRATE (PF) 100 MCG/2ML IJ SOLN
INTRAMUSCULAR | Status: AC
  Filled 2015-01-02: qty 2

## 2015-01-02 MED ORDER — OXYCODONE-ACETAMINOPHEN 5-325 MG PO TABS
1.0000 | ORAL_TABLET | Freq: Once | ORAL | Status: AC
  Administered 2015-01-02: 1 via ORAL

## 2015-01-02 MED ORDER — ONDANSETRON HCL 4 MG/2ML IJ SOLN: 4.0000 mg | Freq: Once | INTRAMUSCULAR | Status: DC | PRN

## 2015-01-02 MED ORDER — OXYCODONE-ACETAMINOPHEN 5-325 MG PO TABS: 1.0000 | ORAL_TABLET | ORAL | Status: DC | PRN

## 2015-01-02 MED ORDER — LACTATED RINGERS IV SOLN
INTRAVENOUS | Status: DC | PRN
  Administered 2015-01-02 (×2): via INTRAVENOUS

## 2015-01-02 MED ORDER — ROCURONIUM BROMIDE 100 MG/10ML IV SOLN
INTRAVENOUS | Status: DC | PRN
  Administered 2015-01-02: 40 mg via INTRAVENOUS

## 2015-01-02 MED ORDER — PROPOFOL 10 MG/ML IV BOLUS
INTRAVENOUS | Status: AC
  Filled 2015-01-02: qty 20

## 2015-01-02 MED ORDER — SUGAMMADEX SODIUM 500 MG/5ML IV SOLN
INTRAVENOUS | Status: AC
  Filled 2015-01-02: qty 5

## 2015-01-02 MED ORDER — LIDOCAINE HCL (PF) 1 % IJ SOLN
INTRAMUSCULAR | Status: AC
  Filled 2015-01-02: qty 30

## 2015-01-02 SURGICAL SUPPLY — 48 items
APPLIER CLIP ROT 10 11.4 M/L (STAPLE) ×2
BLADE SURG ROTATE 9660 (MISCELLANEOUS) IMPLANT
CANISTER SUCTION 2500CC (MISCELLANEOUS) ×2 IMPLANT
CHLORAPREP W/TINT 26ML (MISCELLANEOUS) ×2 IMPLANT
CLIP APPLIE ROT 10 11.4 M/L (STAPLE) ×1 IMPLANT
COVER MAYO STAND STRL (DRAPES) ×2 IMPLANT
COVER SURGICAL LIGHT HANDLE (MISCELLANEOUS) ×2 IMPLANT
DERMABOND ADVANCED (GAUZE/BANDAGES/DRESSINGS) ×2
DERMABOND ADVANCED .7 DNX12 (GAUZE/BANDAGES/DRESSINGS) ×2 IMPLANT
DRAPE C-ARM 42X72 X-RAY (DRAPES) IMPLANT
DRAPE LAPAROSCOPIC ABDOMINAL (DRAPES) ×2 IMPLANT
DRAPE WARM FLUID 44X44 (DRAPE) ×2 IMPLANT
ELECT REM PT RETURN 9FT ADLT (ELECTROSURGICAL) ×2
ELECTRODE REM PT RTRN 9FT ADLT (ELECTROSURGICAL) ×1 IMPLANT
FILTER SMOKE EVAC LAPAROSHD (FILTER) IMPLANT
GLOVE BIO SURGEON STRL SZ 6 (GLOVE) ×2 IMPLANT
GLOVE BIO SURGEON STRL SZ7 (GLOVE) ×2 IMPLANT
GLOVE BIOGEL PI IND STRL 6.5 (GLOVE) ×1 IMPLANT
GLOVE BIOGEL PI IND STRL 7.0 (GLOVE) ×1 IMPLANT
GLOVE BIOGEL PI IND STRL 8 (GLOVE) ×1 IMPLANT
GLOVE BIOGEL PI INDICATOR 6.5 (GLOVE) ×1
GLOVE BIOGEL PI INDICATOR 7.0 (GLOVE) ×1
GLOVE BIOGEL PI INDICATOR 8 (GLOVE) ×1
GLOVE SURG SS PI 8.0 STRL IVOR (GLOVE) ×2 IMPLANT
GOWN STRL REUS W/ TWL LRG LVL3 (GOWN DISPOSABLE) ×2 IMPLANT
GOWN STRL REUS W/TWL 2XL LVL3 (GOWN DISPOSABLE) ×2 IMPLANT
GOWN STRL REUS W/TWL LRG LVL3 (GOWN DISPOSABLE) ×2
KIT BASIN OR (CUSTOM PROCEDURE TRAY) ×2 IMPLANT
KIT ROOM TURNOVER OR (KITS) ×2 IMPLANT
L-HOOK LAP DISP 36CM (ELECTROSURGICAL) ×2
LHOOK LAP DISP 36CM (ELECTROSURGICAL) ×1 IMPLANT
NS IRRIG 1000ML POUR BTL (IV SOLUTION) ×2 IMPLANT
PAD ARMBOARD 7.5X6 YLW CONV (MISCELLANEOUS) ×2 IMPLANT
PENCIL BUTTON HOLSTER BLD 10FT (ELECTRODE) ×2 IMPLANT
POUCH SPECIMEN RETRIEVAL 10MM (ENDOMECHANICALS) ×2 IMPLANT
SCISSORS LAP 5X35 DISP (ENDOMECHANICALS) ×2 IMPLANT
SET CHOLANGIOGRAPH 5 50 .035 (SET/KITS/TRAYS/PACK) IMPLANT
SET IRRIG TUBING LAPAROSCOPIC (IRRIGATION / IRRIGATOR) ×2 IMPLANT
SLEEVE ENDOPATH XCEL 5M (ENDOMECHANICALS) ×2 IMPLANT
SPECIMEN JAR SMALL (MISCELLANEOUS) ×2 IMPLANT
SUT MNCRL AB 4-0 PS2 18 (SUTURE) ×4 IMPLANT
TOWEL OR 17X24 6PK STRL BLUE (TOWEL DISPOSABLE) ×2 IMPLANT
TOWEL OR 17X26 10 PK STRL BLUE (TOWEL DISPOSABLE) ×2 IMPLANT
TRAY LAPAROSCOPIC MC (CUSTOM PROCEDURE TRAY) ×2 IMPLANT
TROCAR XCEL BLUNT TIP 100MML (ENDOMECHANICALS) ×2 IMPLANT
TROCAR XCEL NON-BLD 11X100MML (ENDOMECHANICALS) ×2 IMPLANT
TROCAR XCEL NON-BLD 5MMX100MML (ENDOMECHANICALS) ×2 IMPLANT
TUBING INSUFFLATION (TUBING) ×2 IMPLANT

## 2015-01-02 NOTE — Transfer of Care (Signed)
Immediate Anesthesia Transfer of Care Note  Patient: Sarah Cobb  Procedure(s) Performed: Procedure(s): LAPAROSCOPIC CHOLECYSTECTOMY WITH INTRAOPERATIVE CHOLANGIOGRAM (N/A)  Patient Location: PACU  Anesthesia Type:General  Level of Consciousness: awake, alert , oriented and patient cooperative  Airway & Oxygen Therapy: Patient Spontanous Breathing and Patient connected to nasal cannula oxygen  Post-op Assessment: Report given to RN, Post -op Vital signs reviewed and stable and Patient moving all extremities X 4  Post vital signs: Reviewed and stable  Last Vitals:  Filed Vitals:   01/02/15 1135  BP: 128/74  Pulse: 82  Temp: 37 C  Resp: 16    Complications: No apparent anesthesia complications

## 2015-01-02 NOTE — Interval H&P Note (Signed)
History and Physical Interval Note:  01/02/2015 3:03 PM  Sarah Cobb  has presented today for surgery, with the diagnosis of BILIARY DYSKENISIA  The various methods of treatment have been discussed with the patient and family. After consideration of risks, benefits and other options for treatment, the patient has consented to  Procedure(s): LAPAROSCOPIC CHOLECYSTECTOMY WITH INTRAOPERATIVE CHOLANGIOGRAM (N/A) as a surgical intervention .  The patient's history has been reviewed, patient examined, no change in status, stable for surgery.  I have reviewed the patient's chart and labs.  Questions were answered to the patient's satisfaction.     Hisashi Amadon

## 2015-01-02 NOTE — Discharge Instructions (Signed)
[place] Office Phone Number [phone]   POST OP INSTRUCTIONS  Always review your discharge instruction sheet given to you by the facility where your surgery was performed.  IF YOU HAVE DISABILITY OR FAMILY LEAVE FORMS, YOU MUST BRING THEM TO THE OFFICE FOR PROCESSING.  DO NOT GIVE THEM TO YOUR DOCTOR.  1. A prescription for pain medication may be given to you upon discharge.  Take your pain medication as prescribed, if needed.  If narcotic pain medicine is not needed, then you may take acetaminophen (Tylenol) or ibuprofen (Advil) as needed. 2. Take your usually prescribed medications unless otherwise directed 3. If you need a refill on your pain medication, please contact your pharmacy.  They will contact our office to request authorization.  Prescriptions will not be filled after 5pm or on week-ends. 4. You should eat very light the first 24 hours after surgery, such as soup, crackers, pudding, etc.  Resume your normal diet the day after surgery 5. It is common to experience some constipation if taking pain medication after surgery.  Increasing fluid intake and taking a stool softener will usually help or prevent this problem from occurring.  A mild laxative (Milk of Magnesia or Miralax) should be taken according to package directions if there are no bowel movements after 48 hours. 6. You may shower in 48 hours.  The surgical glue will flake off in 2-3 weeks.   7. ACTIVITIES:  No strenuous activity or heavy lifting for 2 week.   a. You may drive when you no longer are taking prescription pain medication, you can comfortably wear a seatbelt, and you can safely maneuver your car and apply brakes. b. RETURN TO WORK:  __________as tolerated in 1-3 weeks as long as no lifting for at least 2 weeks.  ________ Dennis Bast should see your doctor in the office for a follow-up appointment approximately three-four weeks after your surgery.    WHEN TO CALL YOUR DOCTOR: 1. Fever over  101.0 2. Nausea and/or vomiting. 3. Extreme swelling or bruising. 4. Continued bleeding from incision. 5. Increased pain, redness, or drainage from the incision.  The clinic staff is available to answer your questions during regular business hours.  Please dont hesitate to call and ask to speak to one of the nurses for clinical concerns.  If you have a medical emergency, go to the nearest emergency room or call 911.  A surgeon from [facility] is always on call at the hospital.  For further questions, please visit [email or web address]

## 2015-01-02 NOTE — Op Note (Signed)
Laparoscopic Cholecystectomy  Indications: This patient presents with biliary dyskinesia and will undergo laparoscopic cholecystectomy.  Pre-operative Diagnosis: Biliary dyskinesia  Post-operative Diagnosis: Same  Surgeon: Stark Klein   Anesthesia: General endotracheal anesthesia and local  ASA Class: 2  Procedure Details  The patient was seen again in the Holding Room. The risks, benefits, complications, treatment options, and expected outcomes were discussed with the patient. The possibilities of  bleeding, recurrent infection, damage to nearby structures, the need for additional procedures, failure to diagnose a condition, the possible need to convert to an open procedure, and creating a complication requiring transfusion or operation were discussed with the patient. The likelihood of improving the patient's symptoms with return to their baseline status is good.    The patient and/or family concurred with the proposed plan, giving informed consent. The site of surgery properly noted. The patient was taken to Operating Room, and the procedure verified as Laparoscopic Cholecystectomy with possible Intraoperative Cholangiogram. A Time Out was held and the above information confirmed.  Prior to the induction of general anesthesia, antibiotic prophylaxis was administered. General endotracheal anesthesia was then administered and tolerated well. After the induction, the abdomen was prepped with Chloraprep and draped in the sterile fashion. The patient was positioned in the supine position.  Local anesthetic agent was injected into the skin near the umbilicus and an incision made. We dissected down to the abdominal fascia with blunt dissection.  The fascia was incised vertically and we entered the peritoneal cavity bluntly.  A pursestring suture of 0-Vicryl was placed around the fascial opening.  The Hasson cannula was inserted and secured with the stay suture.  Pneumoperitoneum was then created  with CO2 and tolerated well without any adverse changes in the patient's vital signs. An 11-mm port was placed in the subxiphoid position.  Two 5-mm ports were placed in the right upper quadrant. All skin incisions were infiltrated with a local anesthetic agent before making the incision and placing the trocars.   We positioned the patient in reverse Trendelenburg, tilted slightly to the patient's left.  The gallbladder was identified, the fundus grasped and retracted cephalad. Adhesions were lysed bluntly and with the electrocautery where indicated, taking care not to injure any adjacent organs or viscus. The infundibulum was grasped and retracted laterally, exposing the peritoneum overlying the triangle of Calot. This was then divided and exposed in a blunt fashion. A critical view of the cystic duct and cystic artery was obtained.  The cystic duct was clearly identified and bluntly dissected circumferentially. The cystic duct was ligated with a clip distally, three clips proximally and then divided. The cystic artery was identified, dissected free, ligated with clips and divided as well.   The gallbladder was dissected from the liver bed in retrograde fashion with the electrocautery. The gallbladder was removed and placed in an Endocatch bag.  The gallbladder and Endocatch bag were then removed through the umbilical port site.  The liver bed was irrigated and inspected. Hemostasis was achieved with the electrocautery. Copious irrigation was utilized and was repeatedly aspirated until clear.    We again inspected the right upper quadrant for hemostasis.  Pneumoperitoneum was released as we removed the trocars.   The pursestring suture was used to close the umbilical fascia.  4-0 Monocryl was used to close the skin.   The skin was cleaned and dry, and Dermabond was applied. The patient was then extubated and brought to the recovery room in stable condition. Instrument, sponge, and needle counts were correct  at closure and at the conclusion of the case.   Findings: Mild chronic inflammation.  Distended gallbladder.    Estimated Blood Loss: min         Drains: none          Specimens: Gallbladder to pathology       Complications: None; patient tolerated the procedure well.         Disposition: PACU - hemodynamically stable.         Condition: stable

## 2015-01-02 NOTE — Anesthesia Procedure Notes (Signed)
Procedure Name: Intubation Date/Time: 01/02/2015 3:24 PM Performed by: Carney Living Pre-anesthesia Checklist: Patient identified, Emergency Drugs available, Suction available, Patient being monitored and Timeout performed Patient Re-evaluated:Patient Re-evaluated prior to inductionOxygen Delivery Method: Circle system utilized Preoxygenation: Pre-oxygenation with 100% oxygen Intubation Type: IV induction Ventilation: Mask ventilation without difficulty Laryngoscope Size: Mac and 4 Grade View: Grade I Tube type: Oral Tube size: 7.5 mm Number of attempts: 1 Airway Equipment and Method: Stylet Placement Confirmation: ETT inserted through vocal cords under direct vision,  positive ETCO2 and breath sounds checked- equal and bilateral Secured at: 22 cm Tube secured with: Tape Dental Injury: Teeth and Oropharynx as per pre-operative assessment

## 2015-01-02 NOTE — Anesthesia Preprocedure Evaluation (Addendum)
Anesthesia Evaluation  Patient identified by MRN, date of birth, ID band Patient awake    Reviewed: Allergy & Precautions, NPO status , Patient's Chart, lab work & pertinent test results  History of Anesthesia Complications Negative for: history of anesthetic complications  Airway Mallampati: III  TM Distance: >3 FB Neck ROM: Full    Dental no notable dental hx. (+) Dental Advisory Given, Teeth Intact   Pulmonary former smoker,    Pulmonary exam normal breath sounds clear to auscultation       Cardiovascular negative cardio ROS Normal cardiovascular exam+ dysrhythmias (tachycardia)  Rhythm:Regular Rate:Normal     Neuro/Psych PSYCHIATRIC DISORDERS Anxiety Depression negative neurological ROS     GI/Hepatic Neg liver ROS, GERD  Medicated and Controlled,  Endo/Other  negative endocrine ROS  Renal/GU negative Renal ROS  negative genitourinary   Musculoskeletal negative musculoskeletal ROS (+)   Abdominal   Peds negative pediatric ROS (+)  Hematology negative hematology ROS (+)   Anesthesia Other Findings   Reproductive/Obstetrics negative OB ROS                           Anesthesia Physical Anesthesia Plan  ASA: II  Anesthesia Plan: General   Post-op Pain Management:    Induction: Intravenous  Airway Management Planned: Oral ETT  Additional Equipment:   Intra-op Plan:   Post-operative Plan: Extubation in OR  Informed Consent: I have reviewed the patients History and Physical, chart, labs and discussed the procedure including the risks, benefits and alternatives for the proposed anesthesia with the patient or authorized representative who has indicated his/her understanding and acceptance.   Dental advisory given  Plan Discussed with: CRNA, Anesthesiologist and Surgeon  Anesthesia Plan Comments:        Anesthesia Quick Evaluation

## 2015-01-02 NOTE — Progress Notes (Signed)
Suggamadex birth control precautions discussed with patient during discharge teaching.

## 2015-01-03 ENCOUNTER — Encounter (HOSPITAL_COMMUNITY): Payer: Self-pay | Admitting: General Surgery

## 2015-01-03 NOTE — Anesthesia Postprocedure Evaluation (Signed)
Anesthesia Post Note  Patient: Sarah Cobb  Procedure(s) Performed: Procedure(s) (LRB): LAPAROSCOPIC CHOLECYSTECTOMY WITH INTRAOPERATIVE CHOLANGIOGRAM (N/A)  Anesthesia Type: General Vital Signs Assessment: post-procedure vital signs reviewed and stable Cardiovascular status: blood pressure returned to baseline and stable Anesthetic complications: no    Last Vitals:  Filed Vitals:   01/02/15 1903 01/02/15 1943  BP: 108/85   Pulse: 75   Temp:  36 C  Resp: 23     Last Pain:  Filed Vitals:   01/02/15 1943  PainSc: Asleep                 Onesha Krebbs JENNETTE

## 2015-01-09 ENCOUNTER — Inpatient Hospital Stay (HOSPITAL_COMMUNITY)
Admission: EM | Admit: 2015-01-09 | Discharge: 2015-01-11 | DRG: 440 | Disposition: A | Discharge status: Home / Self Care | Payer: BC Managed Care – PPO | Attending: General Surgery | Admitting: General Surgery

## 2015-01-09 ENCOUNTER — Encounter (HOSPITAL_COMMUNITY): Payer: Self-pay | Admitting: Emergency Medicine

## 2015-01-09 DIAGNOSIS — K219 Gastro-esophageal reflux disease without esophagitis: Secondary | ICD-10-CM | POA: Diagnosis present

## 2015-01-09 DIAGNOSIS — Z87891 Personal history of nicotine dependence: Secondary | ICD-10-CM

## 2015-01-09 DIAGNOSIS — Z87442 Personal history of urinary calculi: Secondary | ICD-10-CM

## 2015-01-09 DIAGNOSIS — R1011 Right upper quadrant pain: Secondary | ICD-10-CM | POA: Diagnosis present

## 2015-01-09 DIAGNOSIS — K859 Acute pancreatitis without necrosis or infection, unspecified: Principal | ICD-10-CM | POA: Diagnosis present

## 2015-01-09 DIAGNOSIS — Z9889 Other specified postprocedural states: Secondary | ICD-10-CM

## 2015-01-09 LAB — COMPREHENSIVE METABOLIC PANEL
ALK PHOS: 63 U/L (ref 38–126)
ALT: 40 U/L (ref 14–54)
ANION GAP: 11 (ref 5–15)
AST: 26 U/L (ref 15–41)
Albumin: 4.3 g/dL (ref 3.5–5.0)
BUN: 12 mg/dL (ref 6–20)
CALCIUM: 9.5 mg/dL (ref 8.9–10.3)
CO2: 22 mmol/L (ref 22–32)
Chloride: 106 mmol/L (ref 101–111)
Creatinine, Ser: 1 mg/dL (ref 0.44–1.00)
Glucose, Bld: 113 mg/dL — ABNORMAL HIGH (ref 65–99)
Potassium: 4 mmol/L (ref 3.5–5.1)
SODIUM: 139 mmol/L (ref 135–145)
Total Bilirubin: 0.4 mg/dL (ref 0.3–1.2)
Total Protein: 7.9 g/dL (ref 6.5–8.1)

## 2015-01-09 LAB — CBC WITH DIFFERENTIAL/PLATELET
Basophils Absolute: 0 10*3/uL (ref 0.0–0.1)
Basophils Relative: 0 %
EOS ABS: 0.2 10*3/uL (ref 0.0–0.7)
EOS PCT: 1 %
HCT: 39.9 % (ref 36.0–46.0)
HEMOGLOBIN: 14 g/dL (ref 12.0–15.0)
LYMPHS ABS: 2.8 10*3/uL (ref 0.7–4.0)
Lymphocytes Relative: 20 %
MCH: 31.7 pg (ref 26.0–34.0)
MCHC: 35.1 g/dL (ref 30.0–36.0)
MCV: 90.5 fL (ref 78.0–100.0)
MONOS PCT: 4 %
Monocytes Absolute: 0.6 10*3/uL (ref 0.1–1.0)
Neutro Abs: 10.9 10*3/uL — ABNORMAL HIGH (ref 1.7–7.7)
Neutrophils Relative %: 75 %
PLATELETS: 295 10*3/uL (ref 150–400)
RBC: 4.41 MIL/uL (ref 3.87–5.11)
RDW: 12.5 % (ref 11.5–15.5)
WBC: 14.5 10*3/uL — ABNORMAL HIGH (ref 4.0–10.5)

## 2015-01-09 LAB — LIPASE, BLOOD: LIPASE: 71 U/L — AB (ref 11–51)

## 2015-01-09 MED ORDER — SODIUM CHLORIDE 0.9 % IV BOLUS (SEPSIS)
1000.0000 mL | Freq: Once | INTRAVENOUS | Status: AC
  Administered 2015-01-09: 1000 mL via INTRAVENOUS

## 2015-01-09 MED ORDER — ONDANSETRON HCL 4 MG/2ML IJ SOLN
4.0000 mg | Freq: Once | INTRAMUSCULAR | Status: AC
  Administered 2015-01-09: 4 mg via INTRAVENOUS
  Filled 2015-01-09: qty 2

## 2015-01-09 MED ORDER — HYDROMORPHONE HCL 1 MG/ML IJ SOLN
0.5000 mg | Freq: Once | INTRAMUSCULAR | Status: AC
  Administered 2015-01-09: 0.5 mg via INTRAVENOUS
  Filled 2015-01-09: qty 1

## 2015-01-09 NOTE — ED Notes (Signed)
Per opt, states she had gallbladder surgery on the 29 th-has been having pain-had CT done today and was told to come to ED r/t complications

## 2015-01-10 ENCOUNTER — Inpatient Hospital Stay (HOSPITAL_COMMUNITY): Payer: BC Managed Care – PPO

## 2015-01-10 DIAGNOSIS — K859 Acute pancreatitis without necrosis or infection, unspecified: Secondary | ICD-10-CM | POA: Diagnosis present

## 2015-01-10 DIAGNOSIS — R1011 Right upper quadrant pain: Secondary | ICD-10-CM | POA: Diagnosis present

## 2015-01-10 DIAGNOSIS — K219 Gastro-esophageal reflux disease without esophagitis: Secondary | ICD-10-CM | POA: Diagnosis present

## 2015-01-10 DIAGNOSIS — Z9889 Other specified postprocedural states: Secondary | ICD-10-CM | POA: Diagnosis not present

## 2015-01-10 DIAGNOSIS — Z87891 Personal history of nicotine dependence: Secondary | ICD-10-CM | POA: Diagnosis not present

## 2015-01-10 DIAGNOSIS — Z87442 Personal history of urinary calculi: Secondary | ICD-10-CM | POA: Diagnosis not present

## 2015-01-10 MED ORDER — HEPARIN SODIUM (PORCINE) 5000 UNIT/ML IJ SOLN
5000.0000 [IU] | Freq: Three times a day (TID) | INTRAMUSCULAR | Status: DC
  Administered 2015-01-10: 5000 [IU] via SUBCUTANEOUS
  Filled 2015-01-10 (×8): qty 1

## 2015-01-10 MED ORDER — KCL IN DEXTROSE-NACL 20-5-0.9 MEQ/L-%-% IV SOLN
INTRAVENOUS | Status: DC
  Administered 2015-01-10 – 2015-01-11 (×2): via INTRAVENOUS
  Filled 2015-01-10 (×5): qty 1000

## 2015-01-10 MED ORDER — ONDANSETRON HCL 4 MG/2ML IJ SOLN: 4.0000 mg | INTRAMUSCULAR | Status: DC | PRN

## 2015-01-10 MED ORDER — HYDROMORPHONE HCL 1 MG/ML IJ SOLN
0.5000 mg | INTRAMUSCULAR | Status: DC | PRN
  Administered 2015-01-10: 1 mg via INTRAVENOUS
  Filled 2015-01-10: qty 1

## 2015-01-10 MED ORDER — ONDANSETRON 4 MG PO TBDP: 4.0000 mg | ORAL_TABLET | Freq: Four times a day (QID) | ORAL | Status: DC | PRN

## 2015-01-10 MED ORDER — ALPRAZOLAM 0.25 MG PO TABS
0.2500 mg | ORAL_TABLET | Freq: Three times a day (TID) | ORAL | Status: DC | PRN
Start: 2015-01-10 — End: 2015-01-11

## 2015-01-10 MED ORDER — PIPERACILLIN-TAZOBACTAM 3.375 G IVPB
3.3750 g | Freq: Three times a day (TID) | INTRAVENOUS | Status: DC
  Administered 2015-01-10 – 2015-01-11 (×4): 3.375 g via INTRAVENOUS
  Filled 2015-01-10 (×6): qty 50

## 2015-01-10 MED ORDER — PANTOPRAZOLE SODIUM 40 MG IV SOLR
40.0000 mg | Freq: Every day | INTRAVENOUS | Status: DC
  Filled 2015-01-10 (×3): qty 40

## 2015-01-10 NOTE — ED Provider Notes (Signed)
CSN: [id number]     Arrival date & time 01/09/15  1747 History   First MD Initiated Contact with Patient 01/09/15 2156     Chief Complaint  Patient presents with  . complications for gallbladder surgery      (Consider location/radiation/quality/duration/timing/severity/associated sxs/prior Treatment) Patient is a 45 y.o. female presenting with abdominal pain.  Abdominal Pain Pain location:  LUQ Pain quality: aching and sharp   Pain radiates to:  Does not radiate Pain severity:  Mild Onset quality:  Gradual Timing:  Constant Chronicity:  New Context: not alcohol use and not diet changes   Relieved by:  None tried Worsened by:  Nothing tried Ineffective treatments:  None tried Associated symptoms: nausea   Associated symptoms: no anorexia, no chills, no cough, no dysuria, no fatigue, no fever and no shortness of breath   Risk factors: no alcohol abuse     Past Medical History  Diagnosis Date  . Depression   . Arrhythmia     Mitral valve regurgitation per pt- OV Dr Chancy Milroy, cardio 6/13- states had eccho and EKG  . Knee pain, right     arthrofibrosis rt knee  . Rosacea   . History of kidney stones   . Family history of adverse reaction to anesthesia     don't know, she was adopted  . GERD (gastroesophageal reflux disease)   . History of hiatal hernia    Past Surgical History  Procedure Laterality Date  . Wisdom tooth extraction    . Cryotherapy      CERVIX  . Colonoscopy  03/15/2011    NORMAL  . Orif patella  07/22/2011    Procedure: OPEN REDUCTION INTERNAL (ORIF) FIXATION PATELLA;  Surgeon: Johnn Hai, MD;  Location: [facility];  Service: Orthopedics;  Laterality: Right;  . Knee arthroscopy  02/03/2012    Procedure: ARTHROSCOPY KNEE;  Surgeon: Johnn Hai, MD;  Location: [facility];  Service: Orthopedics;  Laterality: Right;  Right Knee Arthroscopy with Manipulation Under Anesthesia/Evaluation Under Anesthesia/Debridement  . Fracture surgery      2013  . Cholecystectomy  N/A 01/02/2015    Procedure: LAPAROSCOPIC CHOLECYSTECTOMY WITH INTRAOPERATIVE CHOLANGIOGRAM;  Surgeon: Stark Klein, MD;  Location: [facility];  Service: General;  Laterality: N/A;   Family History  Problem Relation Age of Onset  . Adopted: Yes   Social History  Substance Use Topics  . Smoking status: Former Smoker    Types: Cigarettes    Quit date: 07/21/1998  . Smokeless tobacco: Never Used  . Alcohol Use: No     Comment: socially   OB History    No data available     Review of Systems  Constitutional: Negative for fever, chills and fatigue.  Eyes: Negative for photophobia and pain.  Respiratory: Negative for cough and shortness of breath.   Gastrointestinal: Positive for nausea and abdominal pain. Negative for anorexia.  Genitourinary: Negative for dysuria.  All other systems reviewed and are negative.     Allergies  Diazepam  Home Medications   Prior to Admission medications   Medication Sig Start Date End Date Taking? Authorizing Provider  acyclovir (ZOVIRAX) 400 MG tablet Take 400 mg by mouth 5 (five) times daily. 01/07/15 01/17/15 Yes Historical Provider, MD  ALPRAZolam Duanne Moron) 0.25 MG tablet Take 0.25 mg by mouth 3 (three) times daily as needed for anxiety.  05/30/13  Yes Historical Provider, MD  FINACEA 15 % FOAM Apply 1 application topically daily. 05/03/14  Yes Historical Provider, MD  ibuprofen (ADVIL,MOTRIN)  200 MG tablet Take 400 mg by mouth every 6 (six) hours as needed for moderate pain.   Yes Historical Provider, MD  LO LOESTRIN FE 1 MG-10 MCG / 10 MCG tablet Take 1 tablet by mouth every morning.  05/25/13  Yes Historical Provider, MD  Lysine 1000 MG TABS Take 1,000 mg by mouth daily.   Yes Historical Provider, MD  Multiple Vitamins-Minerals (MULTIVITAMIN PO) Take 1 tablet by mouth daily. Pt takes Flintstones chewable tablet every day   Yes Historical Provider, MD  oxyCODONE-acetaminophen (ROXICET) 5-325 MG tablet Take 1-2 tablets by mouth every 4 (four) hours as  needed for severe pain. 01/02/15  Yes Stark Klein, MD  pantoprazole (PROTONIX) 40 MG tablet Take 1 tablet (40 mg total) by mouth daily. 11/01/14  Yes Lori P Hvozdovic, PA-C  ranitidine (ZANTAC) 150 MG tablet Take 300 mg by mouth at bedtime.  12/16/14  Yes Historical Provider, MD  doxycycline (ORACEA) 40 MG capsule Take 40 mg by mouth every morning. Reported on 01/09/2015    Historical Provider, MD  ondansetron (ZOFRAN) 4 MG tablet Take 1 tablet (4 mg total) by mouth every 8 (eight) hours as needed for nausea or vomiting. Patient not taking: Reported on 01/09/2015 06/19/14   Lafayette Dragon, MD   BP 100/80 mmHg  Pulse 80  Temp(Src) 99.1 F (37.3 C) (Oral)  Resp 18  Ht 5' (1.524 m)  Wt 144 lb (65.318 kg)  BMI 28.12 kg/m2  SpO2 98% Physical Exam  Constitutional: She is oriented to person, place, and time. She appears well-developed and well-nourished.  HENT:  Head: Normocephalic and atraumatic.  Neck: Normal range of motion.  Cardiovascular: Normal rate and regular rhythm.   Pulmonary/Chest: Effort normal. No stridor. No respiratory distress.  Abdominal: Soft. She exhibits no distension.  Musculoskeletal: Normal range of motion. She exhibits no edema or tenderness.  Neurological: She is alert and oriented to person, place, and time.  Skin: Skin is warm and dry.  Nursing note and vitals reviewed.   ED Course  Procedures (including critical care time) Labs Review Labs Reviewed  CBC WITH DIFFERENTIAL/PLATELET - Abnormal; Notable for the following:    WBC 14.5 (*)    Neutro Abs 10.9 (*)    All other components within normal limits  COMPREHENSIVE METABOLIC PANEL - Abnormal; Notable for the following:    Glucose, Bld 113 (*)    All other components within normal limits  LIPASE, BLOOD - Abnormal; Notable for the following:    Lipase 71 (*)    All other components within normal limits  COMPREHENSIVE METABOLIC PANEL  LIPASE, BLOOD  CBC    Imaging Review Nm Hepatobiliary Liver  Func  01/10/2015  CLINICAL DATA:  Patient status post cholecystectomy 01/02/2015. Right upper quadrant pain since the procedure. Question biliary leak. Initial encounter. EXAM: NUCLEAR MEDICINE HEPATOBILIARY IMAGING TECHNIQUE: Sequential images of the abdomen were obtained out to 60 minutes following intravenous administration of radiopharmaceutical. RADIOPHARMACEUTICALS:  5.2 MCi Tc-33m Choletec IV COMPARISON:  Hepatobiliary scan 11/25/2014. FINDINGS: The liver demonstrates prompt, homogeneous radiotracer uptake. No evidence of biliary leak is present. Radiotracer flows into bowel. IMPRESSION: Negative for biliary leak.  Negative exam. Electronically Signed   By: TInge RiseM.D.   On: 01/10/2015 12:04   I have personally reviewed and evaluated these images and lab results as part of my medical decision-making.   EKG Interpretation None      MDM   Final diagnoses:  RUQ abdominal pain    45year old  female here with right upper quadrant abdominal pain after closely to a week ago. His CT scan with a fluid collection possible bile leak. Discussed case with surgery saw the patient and will admit for HIDA scan. Pain controlled at this time.    Merrily Pew, MD 01/10/15 2350

## 2015-01-10 NOTE — H&P (Signed)
Sarah Cobb is an 45 y.o. female.   Chief Complaint:   Severe right upper quadrant pain one week after laparoscopic cholecystectomy HPI: She underwent a laparoscopic cholecystectomy for biliary dyskinesia one week ago by Dr. Barry Dienes. Initially she was very sore but the pain improved. However, for the past 2 days she's had increasing right upper quadrant pain described as a spasmodic-type pain. Begins in the right upper quadrant and runs down to the right lower quadrant.  She's had some nausea. Some chills but no fever. No diarrhea. She has had a little bit of an appetite. She was seen by her primary care physician and a CT scan was done at [facility] which demonstrated some fluid in the gallbladder fossa suspicious for possible bile leak. The distal common bile duct was prominent.  Past Medical History  Diagnosis Date  . Depression   . Arrhythmia     Mitral valve regurgitation per pt- OV Dr Chancy Milroy, cardio 6/13- states had eccho and EKG  . Knee pain, right     arthrofibrosis rt knee  . Rosacea   . History of kidney stones   . Family history of adverse reaction to anesthesia     don't know, she was adopted  . GERD (gastroesophageal reflux disease)   . History of hiatal hernia     Past Surgical History  Procedure Laterality Date  . Wisdom tooth extraction    . Cryotherapy      CERVIX  . Colonoscopy  03/15/2011    NORMAL  . Orif patella  07/22/2011    Procedure: OPEN REDUCTION INTERNAL (ORIF) FIXATION PATELLA;  Surgeon: Johnn Hai, MD;  Location: [facility];  Service: Orthopedics;  Laterality: Right;  . Knee arthroscopy  02/03/2012    Procedure: ARTHROSCOPY KNEE;  Surgeon: Johnn Hai, MD;  Location: [facility];  Service: Orthopedics;  Laterality: Right;  Right Knee Arthroscopy with Manipulation Under Anesthesia/Evaluation Under Anesthesia/Debridement  . Fracture surgery      2013  . Cholecystectomy N/A 01/02/2015    Procedure: LAPAROSCOPIC CHOLECYSTECTOMY WITH INTRAOPERATIVE  CHOLANGIOGRAM;  Surgeon: Stark Klein, MD;  Location: [facility];  Service: General;  Laterality: N/A;    Family History  Problem Relation Age of Onset  . Adopted: Yes   Social History:  reports that she quit smoking about 16 years ago. Her smoking use included Cigarettes. She has never used smokeless tobacco. She reports that she does not drink alcohol or use illicit drugs.  Allergies:  Allergies  Allergen Reactions  . Diazepam Palpitations    Tachycardia  (intolerance)     (Not in a hospital admission)  Results for orders placed or performed during the hospital encounter of 01/09/15 (from the past 48 hour(s))  CBC with Differential     Status: Abnormal   Collection Time: 01/09/15 10:21 PM  Result Value Ref Range   WBC 14.5 (H) 4.0 - 10.5 K/uL   RBC 4.41 3.87 - 5.11 MIL/uL   Hemoglobin 14.0 12.0 - 15.0 g/dL   HCT 39.9 36.0 - 46.0 %   MCV 90.5 78.0 - 100.0 fL   MCH 31.7 26.0 - 34.0 pg   MCHC 35.1 30.0 - 36.0 g/dL   RDW 12.5 11.5 - 15.5 %   Platelets 295 150 - 400 K/uL   Neutrophils Relative % 75 %   Neutro Abs 10.9 (H) 1.7 - 7.7 K/uL   Lymphocytes Relative 20 %   Lymphs Abs 2.8 0.7 - 4.0 K/uL   Monocytes Relative 4 %  Monocytes Absolute 0.6 0.1 - 1.0 K/uL   Eosinophils Relative 1 %   Eosinophils Absolute 0.2 0.0 - 0.7 K/uL   Basophils Relative 0 %   Basophils Absolute 0.0 0.0 - 0.1 K/uL  Comprehensive metabolic panel     Status: Abnormal   Collection Time: 01/09/15 10:21 PM  Result Value Ref Range   Sodium 139 135 - 145 mmol/L   Potassium 4.0 3.5 - 5.1 mmol/L   Chloride 106 101 - 111 mmol/L   CO2 22 22 - 32 mmol/L   Glucose, Bld 113 (H) 65 - 99 mg/dL   BUN 12 6 - 20 mg/dL   Creatinine, Ser 1.00 0.44 - 1.00 mg/dL   Calcium 9.5 8.9 - 10.3 mg/dL   Total Protein 7.9 6.5 - 8.1 g/dL   Albumin 4.3 3.5 - 5.0 g/dL   AST 26 15 - 41 U/L   ALT 40 14 - 54 U/L   Alkaline Phosphatase 63 38 - 126 U/L   Total Bilirubin 0.4 0.3 - 1.2 mg/dL   GFR calc non Af Amer >60 >60 mL/min    GFR calc Af Amer >60 >60 mL/min    Comment: (NOTE) The eGFR has been calculated using the CKD EPI equation. This calculation has not been validated in all clinical situations. eGFR's persistently <60 mL/min signify possible Chronic Kidney Disease.    Anion gap 11 5 - 15  Lipase, blood     Status: Abnormal   Collection Time: 01/09/15 10:21 PM  Result Value Ref Range   Lipase 71 (H) 11 - 51 U/L   No results found.  Review of Systems  Constitutional: Positive for chills. Negative for fever.  Respiratory: Negative for cough.   Gastrointestinal: Positive for nausea and abdominal pain. Negative for vomiting and diarrhea.  Genitourinary: Negative for dysuria and hematuria.    Blood pressure 118/86, pulse 90, temperature 98 F (36.7 C), temperature source Oral, resp. rate 16, SpO2 98 %. Physical Exam  Constitutional: She appears well-developed and well-nourished. No distress.  Eyes: No scleral icterus.  Cardiovascular: Normal rate and regular rhythm.   Respiratory: Effort normal and breath sounds normal.  GI: Soft. She exhibits no distension.  Small incisions are clean and intact. She is tender in the right upper quadrant at the site of her incisions.  Neurological: She is alert.  Skin: Skin is warm and dry.  Psychiatric: She has a normal mood and affect. Her behavior is normal.     Assessment/Plan Severe right upper quadrant pain post laparoscopic cholecystectomy for biliary dyskinesia. Does have a leukocytosis with left shift.  Lipase slightly elevated. Liver function tests normal. CT scan shows fluid in the gallbladder fossa which could be normal postoperative change versus possible bile leak. No evidence of intestinal injury.  Plan: Admit to the hospital. Start empiric IV antibiotics. Schedule HIDA scan for later this morning.  Jatavian Calica J 01/10/2015, 12:07 AM

## 2015-01-10 NOTE — Progress Notes (Signed)
  Subjective: HIDA scan negative.  Feeling better.    Objective: Vital signs in last 24 hours: Temp:  [98 F (36.7 C)-98.4 F (36.9 C)] 98.4 F (36.9 C) (01/06 1341) Pulse Rate:  [72-90] 72 (01/06 1341) Resp:  [15-20] 18 (01/06 1341) BP: (106-139)/(69-86) 118/85 mmHg (01/06 1341) SpO2:  [89 %-100 %] 100 % (01/06 1341) Weight:  [65.318 kg (144 lb)] 65.318 kg (144 lb) (01/06 0220) Last BM Date: 01/09/15  Intake/Output from previous day: 01/05 0701 - 01/06 0700 In: 458.3 [I.V.:458.3] Out: -  Intake/Output this shift: Total I/O In: 755 [I.V.:705; IV Piggyback:50] Out: 1200 [Urine:1200]  General appearance: alert, cooperative and mild distress Resp: breathing comfortably GI: soft, non distended.  slight epigastric tenderness  Lab Results:   Recent Labs  01/09/15 2221  WBC 14.5*  HGB 14.0  HCT 39.9  PLT 295   BMET  Recent Labs  01/09/15 2221  NA 139  K 4.0  CL 106  CO2 22  GLUCOSE 113*  BUN 12  CREATININE 1.00  CALCIUM 9.5   PT/INR No results for input(s): LABPROT, INR in the last 72 hours. ABG No results for input(s): PHART, HCO3 in the last 72 hours.  Invalid input(s): PCO2, PO2  Studies/Results: Nm Hepatobiliary Liver Func  01/10/2015  CLINICAL DATA:  Patient status post cholecystectomy 01/02/2015. Right upper quadrant pain since the procedure. Question biliary leak. Initial encounter. EXAM: NUCLEAR MEDICINE HEPATOBILIARY IMAGING TECHNIQUE: Sequential images of the abdomen were obtained out to 60 minutes following intravenous administration of radiopharmaceutical. RADIOPHARMACEUTICALS:  5.2 MCi Tc-35m Choletec IV COMPARISON:  Hepatobiliary scan 11/25/2014. FINDINGS: The liver demonstrates prompt, homogeneous radiotracer uptake. No evidence of biliary leak is present. Radiotracer flows into bowel. IMPRESSION: Negative for biliary leak.  Negative exam. Electronically Signed   By: TInge RiseM.D.   On: 01/10/2015 12:04     Anti-infectives: Anti-infectives    Start     Dose/Rate Route Frequency Ordered Stop   01/10/15 0015  piperacillin-tazobactam (ZOSYN) IVPB 3.375 g     3.375 g 12.5 mL/hr over 240 Minutes Intravenous 3 times per day 01/10/15 0011        Assessment/Plan: s/p * No surgery found * S/p lap chole last week for chronic cholecystitis.   Looks like pancreatitis is dx and not bile leak.   Will place on full liquids, recheck LFTs and lipase in AM. Hydrate.   Probably home in AM.   LOS: 0 days    [facility]1/06/2015

## 2015-01-11 LAB — CBC
HCT: 37.7 % (ref 36.0–46.0)
Hemoglobin: 12.8 g/dL (ref 12.0–15.0)
MCH: 31.6 pg (ref 26.0–34.0)
MCHC: 34 g/dL (ref 30.0–36.0)
MCV: 93.1 fL (ref 78.0–100.0)
PLATELETS: 273 10*3/uL (ref 150–400)
RBC: 4.05 MIL/uL (ref 3.87–5.11)
RDW: 12.8 % (ref 11.5–15.5)
WBC: 9.7 10*3/uL (ref 4.0–10.5)

## 2015-01-11 LAB — COMPREHENSIVE METABOLIC PANEL
ALT: 43 U/L (ref 14–54)
AST: 30 U/L (ref 15–41)
Albumin: 3.9 g/dL (ref 3.5–5.0)
Alkaline Phosphatase: 57 U/L (ref 38–126)
Anion gap: 10 (ref 5–15)
BILIRUBIN TOTAL: 0.8 mg/dL (ref 0.3–1.2)
BUN: 7 mg/dL (ref 6–20)
CO2: 23 mmol/L (ref 22–32)
CREATININE: 1 mg/dL (ref 0.44–1.00)
Calcium: 9.2 mg/dL (ref 8.9–10.3)
Chloride: 107 mmol/L (ref 101–111)
Glucose, Bld: 94 mg/dL (ref 65–99)
Potassium: 4 mmol/L (ref 3.5–5.1)
Sodium: 140 mmol/L (ref 135–145)
TOTAL PROTEIN: 7 g/dL (ref 6.5–8.1)

## 2015-01-11 LAB — LIPASE, BLOOD: LIPASE: 52 U/L — AB (ref 11–51)

## 2015-01-11 MED ORDER — HYDROCODONE-ACETAMINOPHEN 5-325 MG PO TABS: 1.0000 | ORAL_TABLET | Freq: Four times a day (QID) | ORAL | Status: DC | PRN

## 2015-01-11 NOTE — Progress Notes (Signed)
Pt discharged to home. DC instructions given with husband at bedside. Prescription x 1 given for pain med. No concerns voiced. Left unit in wheelchair pushed by nurse tech. No concerns voiced. Vwilliams,rn.

## 2015-01-11 NOTE — Progress Notes (Signed)
General Surgery Note  LOS: 1 day  POD -     Assessment/Plan: 1.  S/P lap chole - 01/02/2015 - [place]  2.  Readmitted for pain  CT at [facility] with question of leak (I cannot find report and the films are not scanned in Epic)  01/10/2015 - Normal post op HIDA scan Dr. Barry Dienes thinks that she may have had some mild pancreatitis - Her lipase is 52 - 01/11/2015 [The CT scan is not in Epic and I cannot find a report of the CT scan on the floor]  01/11/2015 - Pt admission symptoms have improved but she is not sure she feels ready to go home. She has a ride with her husband.   Plan discharge for today pending how she does after a light  meal (lunch).   I spoke to her husband by phone.  [Note: her husband said that he had a Whipple procedure!]  3. DVT prophylaxis - SQ Heparin   Active Problems:   RUQ abdominal pain   Subjective:  RUQ pain has improved to a dull ache and she has experieneced only mild nausea and hyperactive bowel sounds overnight. She feels ready to go home, but is anxious about the possible return of her pain while at home in the next few days and having to return to the ED.   She decided that she would like to have a light meal and see how she does before discharge. She is unconcerned with the weather, she has a ride home with her husband.  Objective:   Filed Vitals:   01/10/15 1812 01/10/15 2300  BP: 100/80 95/66  Pulse: 80 78  Temp: 99.1 F (37.3 C) 98.7 F (37.1 C)  Resp: 18 18     Intake/Output from previous day:  01/06 0701 - 01/07 0700 In: 755 [I.V.:705; IV Piggyback:50] Out: 2400 [Urine:2400]  Intake/Output this shift:      Physical Exam:   General: Well developed, well nourished female who is alert and oriented.    HEENT: Normal. Pupils equal. .   Lungs:Clear to auscultation bilaterally    Abdomen: Mildly tender upon deep palpation of RUQ. Nondistened.+ normoactive bowel sounds.   Wound: Clean, closed, Non-erythematous.    Lab Results:    Recent Labs  01/09/15 2221 01/11/15 0544  WBC 14.5* 9.7  HGB 14.0 12.8  HCT 39.9 37.7  PLT 295 273    BMET   Recent Labs  01/09/15 2221 01/11/15 0544  NA 139 140  K 4.0 4.0  CL 106 107  CO2 22 23  GLUCOSE 113* 94  BUN 12 7  CREATININE 1.00 1.00  CALCIUM 9.5 9.2    PT/INR  No results for input(s): LABPROT, INR in the last 72 hours.  ABG  No results for input(s): PHART, HCO3 in the last 72 hours.  Invalid input(s): PCO2, PO2   Studies/Results:  Nm Hepatobiliary Liver Func  01/10/2015  CLINICAL DATA:  Patient status post cholecystectomy 01/02/2015. Right upper quadrant pain since the procedure. Question biliary leak. Initial encounter. EXAM: NUCLEAR MEDICINE HEPATOBILIARY IMAGING TECHNIQUE: Sequential images of the abdomen were obtained out to 60 minutes following intravenous administration of radiopharmaceutical. RADIOPHARMACEUTICALS:  5.2 MCi Tc-63m Choletec IV COMPARISON:  Hepatobiliary scan 11/25/2014. FINDINGS: The liver demonstrates prompt, homogeneous radiotracer uptake. No evidence of biliary leak is present. Radiotracer flows into bowel. IMPRESSION: Negative for biliary leak.  Negative exam. Electronically Signed   By: TInge RiseM.D.   On: 01/10/2015 12:04  Anti-infectives:   Anti-infectives    Start     Dose/Rate Route Frequency Ordered Stop   01/10/15 0015  piperacillin-tazobactam (ZOSYN) IVPB 3.375 g     3.375 g 12.5 mL/hr over 240 Minutes Intravenous 3 times per day 01/10/15 0011        Alphonsa Overall, MD, FACS Pager: [place] [facility] Office: [phone] 01/11/2015

## 2015-01-15 NOTE — Discharge Summary (Signed)
Physician Discharge Summary  Patient ID: Sarah Cobb MRN: [id number] DOB/AGE: 06/11/1970 45 y.o.  Admit date: 01/09/2015 Discharge date: 01/15/2015  Admission Diagnoses: Patient Active Problem List  RUQ pain Chronic cholecystitis.  Discharge Diagnoses:  Active Problems:   RUQ abdominal pain pancreatitis  Discharged Condition: stable  Hospital Course:  Patient was admitted from the ED post cholecystectomy with significant RUQ/epigastric pain.  Her lipase was elevated a bit as well as her WBCs.  HIDA was performed which was negative for bile leak.  She improved clinically and was able to advance her diet.  She did not have any gallstones, so MRCP was not performed.  It is not clear why she had a bit of pancreatitis.  She was discharged to home in stable condition.    Consults: None  Significant Diagnostic Studies: labs: Lipas 71 down to 52  Treatments: hydration  Discharge Exam: Blood pressure 95/66, pulse 78, temperature 98.7 F (37.1 C), temperature source Oral, resp. rate 18, height 5' (1.524 m), weight 65.318 kg (144 lb), SpO2 99 %. General appearance: alert Resp: breathing comfortably Cardio: regular rate and rhythm GI: soft, non distended, approp tender.  Disposition: 01-Home or Self Care  Discharge Instructions    Diet - low sodium heart healthy    Complete by:  As directed      Increase activity slowly    Complete by:  As directed             Medication List    TAKE these medications        ALPRAZolam 0.25 MG tablet  Commonly known as:  XANAX  Take 0.25 mg by mouth 3 (three) times daily as needed for anxiety.     doxycycline 40 MG capsule  Commonly known as:  ORACEA  Take 40 mg by mouth every morning. Reported on 01/09/2015     FINACEA 15 % Foam  Generic drug:  Azelaic Acid  Apply 1 application topically daily.     HYDROcodone-acetaminophen 5-325 MG tablet  Commonly known as:  NORCO/VICODIN  Take 1-2 tablets by mouth every 6 (six) hours as needed.      ibuprofen 200 MG tablet  Commonly known as:  ADVIL,MOTRIN  Take 400 mg by mouth every 6 (six) hours as needed for moderate pain.     LO LOESTRIN FE 1 MG-10 MCG / 10 MCG tablet  Generic drug:  Norethindrone-Ethinyl Estradiol-Fe Biphas  Take 1 tablet by mouth every morning.     Lysine 1000 MG Tabs  Take 1,000 mg by mouth daily.     MULTIVITAMIN PO  Take 1 tablet by mouth daily. Pt takes Flintstones chewable tablet every day     ondansetron 4 MG tablet  Commonly known as:  ZOFRAN  Take 1 tablet (4 mg total) by mouth every 8 (eight) hours as needed for nausea or vomiting.     oxyCODONE-acetaminophen 5-325 MG tablet  Commonly known as:  ROXICET  Take 1-2 tablets by mouth every 4 (four) hours as needed for severe pain.     pantoprazole 40 MG tablet  Commonly known as:  PROTONIX  Take 1 tablet (40 mg total) by mouth daily.     ranitidine 150 MG tablet  Commonly known as:  ZANTAC  Take 300 mg by mouth at bedtime.     ZOVIRAX 400 MG tablet  Generic drug:  acyclovir  Take 400 mg by mouth 5 (five) times daily.         SignedStark Cobb 01/15/2015, 8:55 AM

## 2015-01-31 ENCOUNTER — Other Ambulatory Visit: Payer: Self-pay | Admitting: General Surgery

## 2015-01-31 DIAGNOSIS — K859 Acute pancreatitis without necrosis or infection, unspecified: Secondary | ICD-10-CM

## 2015-01-31 DIAGNOSIS — G8929 Other chronic pain: Secondary | ICD-10-CM

## 2015-01-31 DIAGNOSIS — R109 Unspecified abdominal pain: Principal | ICD-10-CM

## 2015-02-02 ENCOUNTER — Ambulatory Visit
Admission: RE | Admit: 2015-02-02 | Discharge: 2015-02-02 | Disposition: A | Discharge status: Home / Self Care | Payer: BC Managed Care – PPO | Source: Ambulatory Visit | Attending: General Surgery | Admitting: General Surgery

## 2015-02-02 DIAGNOSIS — R109 Unspecified abdominal pain: Principal | ICD-10-CM

## 2015-02-02 DIAGNOSIS — K859 Acute pancreatitis without necrosis or infection, unspecified: Secondary | ICD-10-CM

## 2015-02-02 DIAGNOSIS — G8929 Other chronic pain: Secondary | ICD-10-CM

## 2015-02-02 MED ORDER — GADOBENATE DIMEGLUMINE 529 MG/ML IV SOLN
15.0000 mL | Freq: Once | INTRAVENOUS | Status: AC | PRN
  Administered 2015-02-02: 15 mL via INTRAVENOUS

## 2015-02-17 ENCOUNTER — Telehealth: Payer: Self-pay

## 2015-02-17 NOTE — Telephone Encounter (Signed)
I have left message for the patient to call back. I have her scheduled to come in for an appointment 02/18/15 at 9:00 am and need her to confirm it.

## 2015-02-17 NOTE — Telephone Encounter (Signed)
-----   Message from Mauri Pole, MD sent at 02/14/2015  5:32 PM EST ----- Not sure why she has elevated lipase, we will have her follow up with [place]. Beth can you please schedule her. Thanks VN  ----- Message -----    From: Stark Klein, MD    Sent: 02/14/2015  10:23 AM      To: Mauri Pole, MD  This lady is a  45 yo F who had abdominal pain and nausea.  She had studies consistent with biliary dyskinesia and Dr. Olevia Perches sent her to me.  Post op, she has had episodes of pain and nausea.  She required readmission and lipase was like 71.  Still has had slightly elevated lipase.  No stones, MRI negative, HIDA negative post op for leak.  Not sure why she would have low grade pancreatitis.  Can you see her?  tx FB

## 2015-02-17 NOTE — Telephone Encounter (Signed)
Patient is unable to come to that appointment due to work. Will need a different appointment. There are not any openings at this time.

## 2015-02-18 ENCOUNTER — Ambulatory Visit: Payer: BC Managed Care – PPO | Admitting: Gastroenterology

## 2015-02-18 NOTE — Telephone Encounter (Signed)
Patient agrees to an appointment on 03/07/15 at 3:00 pm

## 2015-03-07 ENCOUNTER — Encounter: Payer: Self-pay | Admitting: Physician Assistant

## 2015-03-07 ENCOUNTER — Other Ambulatory Visit (INDEPENDENT_AMBULATORY_CARE_PROVIDER_SITE_OTHER): Payer: BC Managed Care – PPO

## 2015-03-07 ENCOUNTER — Ambulatory Visit (INDEPENDENT_AMBULATORY_CARE_PROVIDER_SITE_OTHER): Payer: BC Managed Care – PPO | Admitting: Physician Assistant

## 2015-03-07 VITALS — BP 100/60 | HR 74 | Ht 60.0 in | Wt 144.0 lb

## 2015-03-07 DIAGNOSIS — R1013 Epigastric pain: Secondary | ICD-10-CM | POA: Diagnosis not present

## 2015-03-07 DIAGNOSIS — R110 Nausea: Secondary | ICD-10-CM | POA: Diagnosis not present

## 2015-03-07 DIAGNOSIS — Z9049 Acquired absence of other specified parts of digestive tract: Secondary | ICD-10-CM | POA: Diagnosis not present

## 2015-03-07 DIAGNOSIS — R1011 Right upper quadrant pain: Secondary | ICD-10-CM

## 2015-03-07 LAB — COMPREHENSIVE METABOLIC PANEL
ALT: 25 U/L (ref 0–35)
AST: 20 U/L (ref 0–37)
Albumin: 4.6 g/dL (ref 3.5–5.2)
Alkaline Phosphatase: 59 U/L (ref 39–117)
BILIRUBIN TOTAL: 0.4 mg/dL (ref 0.2–1.2)
BUN: 13 mg/dL (ref 6–23)
CHLORIDE: 104 meq/L (ref 96–112)
CO2: 27 meq/L (ref 19–32)
CREATININE: 0.84 mg/dL (ref 0.40–1.20)
Calcium: 9.6 mg/dL (ref 8.4–10.5)
GFR: 77.74 mL/min (ref >60.00)
GLUCOSE: 98 mg/dL (ref 70–99)
Potassium: 4.5 mEq/L (ref 3.5–5.1)
SODIUM: 140 meq/L (ref 135–145)
Total Protein: 7.1 g/dL (ref 6.0–8.3)

## 2015-03-07 LAB — CBC WITH DIFFERENTIAL/PLATELET
BASOS PCT: 0.3 % (ref 0.0–3.0)
Basophils Absolute: 0 10*3/uL (ref 0.0–0.1)
EOS PCT: 1.5 % (ref 0.0–5.0)
Eosinophils Absolute: 0.2 10*3/uL (ref 0.0–0.7)
HCT: 39.8 % (ref 36.0–46.0)
HEMOGLOBIN: 13.6 g/dL (ref 12.0–15.0)
Lymphocytes Relative: 24.7 % (ref 12.0–46.0)
Lymphs Abs: 2.9 10*3/uL (ref 0.7–4.0)
MCHC: 34.2 g/dL (ref 30.0–36.0)
MCV: 92.9 fl (ref 78.0–100.0)
MONOS PCT: 4.5 % (ref 3.0–12.0)
Monocytes Absolute: 0.5 10*3/uL (ref 0.1–1.0)
NEUTROS PCT: 69 % (ref 43.0–77.0)
Neutro Abs: 8 10*3/uL — ABNORMAL HIGH (ref 1.4–7.7)
PLATELETS: 300 10*3/uL (ref 150.0–400.0)
RBC: 4.28 Mil/uL (ref 3.87–5.11)
RDW: 13.2 % (ref 11.5–15.5)
WBC: 11.6 10*3/uL — AB (ref 4.0–10.5)

## 2015-03-07 LAB — AMYLASE: Amylase: 58 U/L (ref 27–131)

## 2015-03-07 LAB — SEDIMENTATION RATE: Sed Rate: 28 mm/hr — ABNORMAL HIGH (ref 0–22)

## 2015-03-07 LAB — LIPASE: Lipase: 85 U/L — ABNORMAL HIGH (ref 11.0–59.0)

## 2015-03-07 MED ORDER — SUCRALFATE 1 G PO TABS: 1.0000 g | ORAL_TABLET | Freq: Three times a day (TID) | ORAL | Status: DC

## 2015-03-07 NOTE — Progress Notes (Signed)
Patient ID: Sarah Cobb, female   DOB: 1970/02/06, 45 y.o.   MRN: [id number]   Subjective:    Patient ID: Sarah Cobb, female    DOB: [birth year], 45 y.o.   MRN: [id number]  HPI  Sarah Cobb Is a pleasant 45 year old white female former patient of Dr. Delfin Edis who is to be established with Dr. Silverio Decamp. Patient has history of GERD , and fatty liver ,and has been on chronic Protonix. She was seen in our office in November 2016 by Cecille Rubin [facility] with complaints of right upper quadrant pain. Upper [place] ultrasound had been unremarkable and LFTs were normal. [facility] HIDA scan was done and showed low ejection fraction and she was referred to surgery. She underwent laparoscopic cholecystectomy with Dr. Barry Dienes in December 2016 which was done outpatient. She says she never felt well after the surgery continued to have right upper quadrant discomfort and nausea. She was readmitted to the hospital overnight on January 6 with complaints of severe right upper quadrant pain. She said she had developed an abrupt increase in pain which was sharp and crampy. There was concern for a bile leak.. She had an ultrasound done that showed a small amount of fluid in the gallbladder fossa and then HIDA scan was done which was negative for a bile leak. She was discharged home but continued to complain of right upper quadrant pain and MRI of the abdomen was done on 02/02/2015. This showed the expected postoperative findings along the gallbladder bed no significant fluid, and a CBD of 6 mm pancreas within normal limits. Around that same time she was noted to have mildly elevated lipase. Lipase early in January was 70 1 repeat on 01/11/2015 was 52. She is been seen back by Dr. Cher Nakai, and referred back here. Patient says that her symptoms are not as intense as they had been and she is actually functioning pretty well at this time. She says she's not having any ongoing pain but does have discomfort which continues to, and go in the  epigastrium and right upper quadrant and sometimes in the left upper quadrant. She does not note any change with by mouth intake. Her appetite is been okay her weight has been stable no fever or chills. She continues to complain of nausea which is present every day but not necessarily all day long. She's not taking any Zofran or Phenergan because the Zofran is sedating. She also has had an increase in heartburn despite being on Protonix and taking Zantac at bedtime.  Review of Systems Pertinent positive and negative review of systems were noted in the above HPI section.  All other review of systems was otherwise negative.  Outpatient Encounter Prescriptions as of 03/07/2015  Medication Sig  . ALPRAZolam (XANAX) 0.25 MG tablet Take 0.25 mg by mouth 3 (three) times daily as needed for anxiety.   Marland Kitchen doxycycline (ORACEA) 40 MG capsule Take 40 mg by mouth every morning. Reported on 01/09/2015  . FINACEA 15 % FOAM Apply 1 application topically daily.  Marland Kitchen HYDROcodone-acetaminophen (NORCO/VICODIN) 5-325 MG tablet Take 1-2 tablets by mouth every 6 (six) hours as needed.  Marland Kitchen ibuprofen (ADVIL,MOTRIN) 200 MG tablet Take 400 mg by mouth every 6 (six) hours as needed for moderate pain.  . LO LOESTRIN FE 1 MG-10 MCG / 10 MCG tablet Take 1 tablet by mouth every morning.   Marland Kitchen Lysine 1000 MG TABS Take 1,000 mg by mouth daily.  . Multiple Vitamins-Minerals (MULTIVITAMIN PO) Take 1 tablet by mouth daily. Pt takes  Flintstones chewable tablet every day  . ondansetron (ZOFRAN) 4 MG tablet Take 1 tablet (4 mg total) by mouth every 8 (eight) hours as needed for nausea or vomiting.  Marland Kitchen oxyCODONE-acetaminophen (ROXICET) 5-325 MG tablet Take 1-2 tablets by mouth every 4 (four) hours as needed for severe pain.  . pantoprazole (PROTONIX) 40 MG tablet Take 1 tablet (40 mg total) by mouth daily.  . ranitidine (ZANTAC) 150 MG tablet Take 300 mg by mouth at bedtime.   . sucralfate (CARAFATE) 1 g tablet Take 1 tablet (1 g total) by mouth 4  (four) times daily -  with meals and at bedtime.   No facility-administered encounter medications on file as of 03/07/2015.   Allergies  Allergen Reactions  . Diazepam Palpitations    Tachycardia  (intolerance)   Patient Active Problem List   Diagnosis Date Noted  . S/P cholecystectomy 03/07/2015  . RUQ abdominal pain 01/10/2015  . History of anal fissures 11/25/2013  . Diminished ovarian reserve 11/06/2013   Social History   Social History  . Marital Status: Married    Spouse Name: N/A  . Number of Children: 0  . Years of Education: N/A   Occupational History  . coodinator with [employer]    Social History Main Topics  . Smoking status: Former Smoker -- 15 years    Types: Cigarettes    Quit date: 07/21/1998  . Smokeless tobacco: Never Used  . Alcohol Use: No     Comment: socially  . Drug Use: No  . Sexual Activity: Not Currently   Other Topics Concern  . Not on file   Social History Narrative    [name]'s family history is not on file. She was adopted.      Objective:    Filed Vitals:   03/07/15 1501  BP: 100/60  Pulse: 74    Physical Exam  well-developed white female in no acute distress, pleasant blood pressure 100/60 pulse 74 height 5 foot weight 144 HEENT; nontraumatic, cephalic EOMI PERRLA sclera anicteric, Cardiovascular; regular rate and rhythm with S1-S2 no murmur or gallop, Pulmonary; clear bilaterally, Abdomen; soft, healed incisional ports, she is tender in the epigastrium and right upper quadrant there is no guarding or rebound no palpable mass or hepatosplenomegaly bowel sounds are present, Rectal; exam not done, Neuropsych; mood and affect appropriate     Assessment & Plan:   #1 45 yo female with bilary dyskinesia who is s/p cholecystectomy December 2016 who has had persistent right upper quadrant pain and nausea since surgery. She had a brief readmission and was ruled out for bile leak. Subsequent MRI also was unremarkable. She has  had a minimally elevated lipase of uncertain significance. No evidence for pancreatitis by imaging Etiology of persistent symptoms is not clear, rule out bile gastritis. #2 history of fatty liver #3 chronic GERD  Plan; CBC, CMET, amylase and lipase Continue Protonix 40 mg by mouth every morning,Zantac 150 mg qhs and  add Carafate 1 g 3 times a day between meals and at bedtime 6 weeks Will schedule for EGD with Dr. Silverio Decamp. Procedure discussed in detail with patient and she is agreeable to proceed    Shaketta Rill S Adasia Hoar PA-C 03/07/2015   Cc: Aura Dials, PA-C

## 2015-03-07 NOTE — Patient Instructions (Signed)
Please go to the basement level to have your labs drawn.  Stop the Ibuprofen. We sent a prescription for Carafate tablets. Take 1 tab between meals and at bedtime.  You have been scheduled for an endoscopy. Please follow written instructions given to you at your visit today. If you use inhalers (even only as needed), please bring them with you on the day of your procedure. Your physician has requested that you go to www.startemmi.com and enter the access code given to you at your visit today. This web site gives a general overview about your procedure. However, you should still follow specific instructions given to you by our office regarding your preparation for the procedure.

## 2015-03-10 ENCOUNTER — Encounter: Payer: Self-pay | Admitting: Physician Assistant

## 2015-03-11 NOTE — Progress Notes (Signed)
Reviewed and agree with documentation and assessment and plan. K. Veena Nandigam , MD   

## 2015-03-12 ENCOUNTER — Other Ambulatory Visit: Payer: Self-pay

## 2015-03-17 ENCOUNTER — Other Ambulatory Visit: Payer: Self-pay

## 2015-03-17 DIAGNOSIS — R109 Unspecified abdominal pain: Secondary | ICD-10-CM

## 2015-03-21 ENCOUNTER — Encounter: Payer: Self-pay | Admitting: Gastroenterology

## 2015-03-21 ENCOUNTER — Ambulatory Visit (AMBULATORY_SURGERY_CENTER): Payer: BC Managed Care – PPO | Admitting: Gastroenterology

## 2015-03-21 DIAGNOSIS — R1011 Right upper quadrant pain: Secondary | ICD-10-CM | POA: Diagnosis not present

## 2015-03-21 DIAGNOSIS — R1013 Epigastric pain: Secondary | ICD-10-CM

## 2015-03-21 DIAGNOSIS — R109 Unspecified abdominal pain: Secondary | ICD-10-CM

## 2015-03-21 MED ORDER — SODIUM CHLORIDE 0.9 % IV SOLN: 500.0000 mL | INTRAVENOUS | Status: DC

## 2015-03-21 NOTE — Progress Notes (Signed)
A and O x 3 Report to [employer]

## 2015-03-21 NOTE — Patient Instructions (Signed)
Impressions/recommendations:  Normal exam  YOU HAD AN ENDOSCOPIC PROCEDURE TODAY AT [facility]:   Refer to the procedure report that was given to you for any specific questions about what was found during the examination.  If the procedure report does not answer your questions, please call your gastroenterologist to clarify.  If you requested that your care partner not be given the details of your procedure findings, then the procedure report has been included in a sealed envelope for you to review at your convenience later.  YOU SHOULD EXPECT: Some feelings of bloating in the abdomen. Passage of more gas than usual.  Walking can help get rid of the air that was put into your GI tract during the procedure and reduce the bloating. If you had a lower endoscopy (such as a colonoscopy or flexible sigmoidoscopy) you may notice spotting of blood in your stool or on the toilet paper. If you underwent a bowel prep for your procedure, you may not have a normal bowel movement for a few days.  Please Note:  You might notice some irritation and congestion in your nose or some drainage.  This is from the oxygen used during your procedure.  There is no need for concern and it should clear up in a day or so.  SYMPTOMS TO REPORT IMMEDIATELY:   Following upper endoscopy (EGD)  Vomiting of blood or coffee ground material  New chest pain or pain under the shoulder blades  Painful or persistently difficult swallowing  New shortness of breath  Fever of 100F or higher  Black, tarry-looking stools  For urgent or emergent issues, a gastroenterologist can be reached at any hour by calling [phone].   DIET: Your first meal following the procedure should be a small meal and then it is ok to progress to your normal diet. Heavy or fried foods are harder to digest and may make you feel nauseous or bloated.  Likewise, meals heavy in dairy and vegetables can increase bloating.  Drink plenty of  fluids but you should avoid alcoholic beverages for 24 hours.  ACTIVITY:  You should plan to take it easy for the rest of today and you should NOT DRIVE or use heavy machinery until tomorrow (because of the sedation medicines used during the test).    FOLLOW UP: Our staff will call the number listed on your records the next business day following your procedure to check on you and address any questions or concerns that you may have regarding the information given to you following your procedure. If we do not reach you, we will leave a message.  However, if you are feeling well and you are not experiencing any problems, there is no need to return our call.  We will assume that you have returned to your regular daily activities without incident.  If any biopsies were taken you will be contacted by phone or by letter within the next 1-3 weeks.  Please call us at [phone] if you have not heard about the biopsies in 3 weeks.    SIGNATURES/CONFIDENTIALITY: You and/or your care partner have signed paperwork which will be entered into your electronic medical record.  These signatures attest to the fact that that the information above on your After Visit Summary has been reviewed and is understood.  Full responsibility of the confidentiality of this discharge information lies with you and/or your care-partner.

## 2015-03-21 NOTE — Op Note (Signed)
[facility] Patient Name: Sarah Cobb Procedure Date: 03/21/2015 9:15 AM MRN: [id number] Endoscopist: Mauri Pole , MD Age: 45 Referring MD:  Date of Birth: [birth year] Gender: Female Procedure:                Upper GI endoscopy Indications:              Epigastric abdominal pain, Abdominal pain in the                            right upper quadrant Medicines:                Propofol total dose 200 mg IV, Monitored Anesthesia                            Care Procedure:                Pre-Anesthesia Assessment:                           - Prior to the procedure, a History and Physical                            was performed, and patient medications and                            allergies were reviewed. The patient's tolerance of                            previous anesthesia was also reviewed. The risks                            and benefits of the procedure and the sedation                            options and risks were discussed with the patient.                            All questions were answered, and informed consent                            was obtained. Prior Anticoagulants: The patient has                            taken no previous anticoagulant or antiplatelet                            agents. ASA Grade Assessment: II - A patient with                            mild systemic disease. After reviewing the risks                            and benefits, the patient was deemed in  satisfactory condition to undergo the procedure.                           After obtaining informed consent, the endoscope was                            passed under direct vision. Throughout the                            procedure, the patient's blood pressure, pulse, and                            oxygen saturations were monitored continuously. The                            Model GIF-[id number]) scope was introduced          through the mouth, and advanced to the second part                            of duodenum. The upper GI endoscopy was                            accomplished without difficulty. The patient                            tolerated the procedure well. Scope In: Scope Out: Findings:      The esophagus was normal.      The stomach was normal.      The examined duodenum was normal. Complications:            No immediate complications. Estimated Blood Loss:     Estimated blood loss: none. Estimated blood loss:                            none. Impression:               - Normal esophagus.                           - Normal stomach.                           - Normal examined duodenum.                           - No specimens collected. Recommendation:           - Patient has a contact number available for                            emergencies. The signs and symptoms of potential                            delayed complications were discussed with the                            patient. Return to  normal activities tomorrow.                            Written discharge instructions were provided to the                            patient.                           - Resume previous diet.                           - Continue present medications.                           - No repeat upper endoscopy. Procedure Code(s):        --- Professional ---                           [phone], Esophagogastroduodenoscopy, flexible,                            transoral; diagnostic, including collection of                            specimen(s) by brushing or washing, when performed                            (separate procedure) CPT copyright 2016 American Medical Association. All rights reserved. Mauri Pole, MD 03/21/2015 9:36:36 AM This report has been signed electronically. Number of Addenda: 0

## 2015-03-23 ENCOUNTER — Encounter: Payer: Self-pay | Admitting: Physician Assistant

## 2015-03-24 ENCOUNTER — Telehealth: Payer: Self-pay | Admitting: *Deleted

## 2015-03-24 ENCOUNTER — Telehealth: Payer: Self-pay | Admitting: Gastroenterology

## 2015-03-24 NOTE — Telephone Encounter (Signed)
Message left

## 2015-03-26 ENCOUNTER — Telehealth: Payer: Self-pay | Admitting: Gastroenterology

## 2015-03-26 NOTE — Telephone Encounter (Signed)
Discussed with the patient. She is still having the same symptoms that brought her in to the GI office. Tests have been non-revealing to the cause. She will go forward with the CT.

## 2015-03-28 ENCOUNTER — Telehealth: Payer: Self-pay | Admitting: Gastroenterology

## 2015-03-31 NOTE — Telephone Encounter (Signed)
Calling about CT scan results

## 2015-03-31 NOTE — Telephone Encounter (Signed)
Called [facility] again for the results.

## 2015-04-03 NOTE — Telephone Encounter (Signed)
Requested report from [facility]. Spoke with [place].

## 2015-04-10 ENCOUNTER — Encounter: Payer: Self-pay | Admitting: Physician Assistant

## 2015-07-22 ENCOUNTER — Ambulatory Visit
Admission: RE | Admit: 2015-07-22 | Discharge: 2015-07-22 | Disposition: A | Discharge status: Home / Self Care | Payer: BC Managed Care – PPO | Source: Ambulatory Visit | Attending: Physician Assistant | Admitting: Physician Assistant

## 2015-07-22 ENCOUNTER — Other Ambulatory Visit: Payer: Self-pay | Admitting: Physician Assistant

## 2015-07-22 DIAGNOSIS — R1032 Left lower quadrant pain: Secondary | ICD-10-CM

## 2015-07-22 MED ORDER — IOPAMIDOL (ISOVUE-300) INJECTION 61%: 100.0000 mL | Freq: Once | INTRAVENOUS | Status: DC | PRN

## 2015-09-09 ENCOUNTER — Ambulatory Visit
Admission: RE | Admit: 2015-09-09 | Discharge: 2015-09-09 | Disposition: A | Discharge status: Home / Self Care | Payer: BC Managed Care – PPO | Source: Ambulatory Visit | Attending: Gastroenterology | Admitting: Gastroenterology

## 2015-09-09 ENCOUNTER — Other Ambulatory Visit: Payer: Self-pay | Admitting: Gastroenterology

## 2015-09-09 DIAGNOSIS — R1032 Left lower quadrant pain: Secondary | ICD-10-CM

## 2015-09-09 MED ORDER — IOPAMIDOL (ISOVUE-300) INJECTION 61%
100.0000 mL | Freq: Once | INTRAVENOUS | Status: AC | PRN
  Administered 2015-09-09: 100 mL via INTRAVENOUS

## 2015-10-02 ENCOUNTER — Emergency Department (HOSPITAL_COMMUNITY)
Admission: EM | Admit: 2015-10-02 | Discharge: 2015-10-03 | Disposition: A | Discharge status: Home / Self Care | Payer: BC Managed Care – PPO | Attending: Emergency Medicine | Admitting: Emergency Medicine

## 2015-10-02 ENCOUNTER — Emergency Department (HOSPITAL_COMMUNITY): Payer: BC Managed Care – PPO

## 2015-10-02 ENCOUNTER — Encounter (HOSPITAL_COMMUNITY): Payer: Self-pay | Admitting: Emergency Medicine

## 2015-10-02 DIAGNOSIS — R072 Precordial pain: Secondary | ICD-10-CM | POA: Diagnosis present

## 2015-10-02 DIAGNOSIS — R0789 Other chest pain: Secondary | ICD-10-CM

## 2015-10-02 DIAGNOSIS — J189 Pneumonia, unspecified organism: Secondary | ICD-10-CM | POA: Diagnosis not present

## 2015-10-02 DIAGNOSIS — Z87891 Personal history of nicotine dependence: Secondary | ICD-10-CM | POA: Diagnosis not present

## 2015-10-02 HISTORY — DX: Anxiety disorder, unspecified: F41.9

## 2015-10-02 LAB — CBC
HCT: 39.7 % (ref 36.0–46.0)
HEMOGLOBIN: 13.2 g/dL (ref 12.0–15.0)
MCH: 31 pg (ref 26.0–34.0)
MCHC: 33.2 g/dL (ref 30.0–36.0)
MCV: 93.2 fL (ref 78.0–100.0)
Platelets: 211 10*3/uL (ref 150–400)
RBC: 4.26 MIL/uL (ref 3.87–5.11)
RDW: 12.8 % (ref 11.5–15.5)
WBC: 15.9 10*3/uL — ABNORMAL HIGH (ref 4.0–10.5)

## 2015-10-02 LAB — BASIC METABOLIC PANEL
ANION GAP: 10 (ref 5–15)
BUN: 10 mg/dL (ref 6–20)
CALCIUM: 9.1 mg/dL (ref 8.9–10.3)
CHLORIDE: 107 mmol/L (ref 101–111)
CO2: 20 mmol/L — AB (ref 22–32)
Creatinine, Ser: 0.77 mg/dL (ref 0.44–1.00)
GFR calc non Af Amer: >60 mL/min (ref >60)
Glucose, Bld: 135 mg/dL — ABNORMAL HIGH (ref 65–99)
POTASSIUM: 3.7 mmol/L (ref 3.5–5.1)
Sodium: 137 mmol/L (ref 135–145)

## 2015-10-02 NOTE — ED Provider Notes (Signed)
[facility] DEPT Provider Note   CSN: [id number] Arrival date & time: 10/02/15  1955  By signing my name below, I, Sarah Cobb. Sarah Cobb, attest that this documentation has been prepared under the direction and in the presence of Blanchie Dessert, MD.  Electronically Signed: Maud Cobb. Sarah Cobb, ED Scribe. 10/02/15. 12:02 AM.    History   Chief Complaint Chief Complaint  Patient presents with  . Chest Pain   The history is provided by the patient. No language interpreter was used.    HPI Comments: Sarah Cobb is a 45 y.o. female without any pertinent past medical history who presents to the Emergency Department complaining of intermittent, unchanged mid substernal chest pain that radiates through and between her shoulder blades x 2 days. No aggravating or alleviating factors reported. She also reports an ongoing dry cough. OTC Gas X attempted at home without any improvement. Denies any fever, chills, nausea, vomiting, or diaphoresis. No unilateral swelling. She is not a smoker. No recent long distance travel. Pt is currently on oral birth control.  PCP: Selinda Orion    Past Medical History:  Diagnosis Date  . Anxiety   . Arrhythmia    Mitral valve regurgitation per pt- OV Dr Chancy Milroy, cardio 6/13- states had eccho and EKG  . Depression   . Family history of adverse reaction to anesthesia    don't know, she was adopted  . GERD (gastroesophageal reflux disease)   . History of hiatal hernia   . History of kidney stones   . Knee pain, right    arthrofibrosis rt knee  . Rosacea     Patient Active Problem List   Diagnosis Date Noted  . S/P cholecystectomy 03/07/2015  . RUQ abdominal pain 01/10/2015  . History of anal fissures 11/25/2013  . Diminished ovarian reserve 11/06/2013    Past Surgical History:  Procedure Laterality Date  . CHOLECYSTECTOMY N/A 01/02/2015   Procedure: LAPAROSCOPIC CHOLECYSTECTOMY WITH INTRAOPERATIVE CHOLANGIOGRAM;  Surgeon: Stark Klein, MD;   Location: [facility];  Service: General;  Laterality: N/A;  . COLONOSCOPY  03/15/2011   NORMAL  . CRYOTHERAPY     CERVIX  . FRACTURE SURGERY     2013  . KNEE ARTHROSCOPY  02/03/2012   Procedure: ARTHROSCOPY KNEE;  Surgeon: Johnn Hai, MD;  Location: [facility];  Service: Orthopedics;  Laterality: Right;  Right Knee Arthroscopy with Manipulation Under Anesthesia/Evaluation Under Anesthesia/Debridement  . ORIF PATELLA  07/22/2011   Procedure: OPEN REDUCTION INTERNAL (ORIF) FIXATION PATELLA;  Surgeon: Johnn Hai, MD;  Location: [facility];  Service: Orthopedics;  Laterality: Right;  . WISDOM TOOTH EXTRACTION      OB History    No data available       Home Medications    Prior to Admission medications   Medication Sig Start Date End Date Taking? Authorizing Provider  ALPRAZolam (XANAX) 0.25 MG tablet Take 0.25 mg by mouth 3 (three) times daily as needed for anxiety.  05/30/13   Historical Provider, MD  diphenhydrAMINE (BENADRYL) 25 MG tablet Take 25 mg by mouth every 6 (six) hours as needed.    Historical Provider, MD  doxycycline (ORACEA) 40 MG capsule Take 40 mg by mouth every morning. Reported on 01/09/2015    Historical Provider, MD  FINACEA 15 % FOAM Apply 1 application topically daily. 05/03/14   Historical Provider, MD  HYDROcodone-acetaminophen (NORCO/VICODIN) 5-325 MG tablet Take 1-2 tablets by mouth every 6 (six) hours as needed. 01/11/15   Alphonsa Overall, MD  ibuprofen (ADVIL,MOTRIN)  200 MG tablet Take 400 mg by mouth every 6 (six) hours as needed for moderate pain.    Historical Provider, MD  LO LOESTRIN FE 1 MG-10 MCG / 10 MCG tablet Take 1 tablet by mouth every morning.  05/25/13   Historical Provider, MD  Lysine 1000 MG TABS Take 1,000 mg by mouth daily.    Historical Provider, MD  Multiple Vitamins-Minerals (MULTIVITAMIN PO) Take 1 tablet by mouth daily. Pt takes Flintstones chewable tablet every day    Historical Provider, MD  ondansetron (ZOFRAN) 4 MG tablet Take 1 tablet (4 mg total)  by mouth every 8 (eight) hours as needed for nausea or vomiting. 06/19/14   Lafayette Dragon, MD  oxyCODONE-acetaminophen (ROXICET) 5-325 MG tablet Take 1-2 tablets by mouth every 4 (four) hours as needed for severe pain. 01/02/15   Stark Klein, MD  pantoprazole (PROTONIX) 40 MG tablet Take 1 tablet (40 mg total) by mouth daily. 11/01/14   Lori P Hvozdovic, PA-C  ranitidine (ZANTAC) 150 MG tablet Take 300 mg by mouth at bedtime.  12/16/14   Historical Provider, MD  sucralfate (CARAFATE) 1 g tablet Take 1 tablet (1 g total) by mouth 4 (four) times daily -  with meals and at bedtime. 03/07/15   Amy Genia Harold, PA-C    Family History Family History  Problem Relation Age of Onset  . Adopted: Yes    Social History Social History  Substance Use Topics  . Smoking status: Former Smoker    Years: 15.00    Types: Cigarettes    Quit date: 07/21/1998  . Smokeless tobacco: Never Used  . Alcohol use No     Comment: socially     Allergies   Diazepam   Review of Systems Review of Systems  Constitutional: Negative for chills and fever.  Respiratory: Positive for cough.   Cardiovascular: Positive for chest pain.  Gastrointestinal: Negative for nausea and vomiting.  Musculoskeletal: Positive for back pain.  All other systems reviewed and are negative.    Physical Exam Updated Vital Signs BP 125/88   Pulse 102   Temp 99 F (37.2 C) (Oral)   Resp 18   SpO2 99%   Physical Exam  Constitutional: She is oriented to person, place, and time. She appears well-developed and well-nourished. No distress.  HENT:  Head: Normocephalic and atraumatic.  Eyes: EOM are normal.  Neck: Normal range of motion.  Cardiovascular: Normal rate, regular rhythm and normal heart sounds.   All pulses are equal bilaterally to all extremities.  Pulmonary/Chest: Effort normal and breath sounds normal. She exhibits no tenderness.  No reproducible pain to chest.  Abdominal: Soft. She exhibits no distension. There is  tenderness. There is no rebound and no guarding.  Mild LUQ abdominal pain.  Musculoskeletal: Normal range of motion. She exhibits no tenderness.  No reproducible pain to back.  Neurological: She is alert and oriented to person, place, and time.  Skin: Skin is warm and dry.  Psychiatric: She has a normal mood and affect. Judgment normal.  Nursing note and vitals reviewed.    ED Treatments / Results   DIAGNOSTIC STUDIES: Oxygen Saturation is 99% on RA, Normal by my interpretation.    COORDINATION OF CARE: 12:01 AM- Will order blood work, EKG, and CXR. Discussed treatment plan with pt at bedside and pt agreed to plan.     Labs (all labs ordered are listed, but only abnormal results are displayed) Labs Reviewed  BASIC METABOLIC PANEL - Abnormal; Notable for  the following:       Result Value   CO2 20 (*)    Glucose, Bld 135 (*)    All other components within normal limits  CBC - Abnormal; Notable for the following:    WBC 15.9 (*)    All other components within normal limits  LIPASE, BLOOD - Abnormal; Notable for the following:    Lipase 54 (*)    All other components within normal limits  HEPATIC FUNCTION PANEL - Abnormal; Notable for the following:    Bilirubin, Direct <0.1 (*)    All other components within normal limits  D-DIMER, QUANTITATIVE (NOT AT [facility])  Randolm Idol, ED  Randolm Idol, ED    EKG  EKG Interpretation  Date/Time:  Thursday October 02 2015 20:05:03 EDT Ventricular Rate:  97 PR Interval:  136 QRS Duration: 70 QT Interval:  332 QTC Calculation: 421 R Axis:   28 Text Interpretation:  Normal sinus rhythm Low voltage QRS No significant change since last tracing Confirmed by Maryan Rued  MD, Loree Fee ([id number]) on 10/02/2015 11:46:13 PM       Radiology Dg Chest 2 View  Result Date: 10/02/2015 CLINICAL DATA:  45 year old presenting with two-day history of chest pain and shortness of breath. Nonsmoker. EXAM: CHEST  2 VIEW COMPARISON:  09/01/2014,  05/24/2014 and earlier, including CTA chest 07/17/2013. FINDINGS: Cardiomediastinal silhouette unremarkable, unchanged. Lungs clear. Bronchovascular markings normal. Pulmonary vascularity normal. No visible pleural effusions. No pneumothorax. Slight thoracolumbar dextroscoliosis, unchanged. IMPRESSION: No acute cardiopulmonary disease.  Stable examination. Electronically Signed   By: Evangeline Dakin M.D.   On: 10/02/2015 20:38   Ct Angio Chest/abd/pel For Dissection W And/or Wo Contrast  Result Date: 10/03/2015 CLINICAL DATA:  Acute onset of mid chest pain, radiating to the back. Initial encounter. EXAM: CT ANGIOGRAPHY CHEST, ABDOMEN AND PELVIS TECHNIQUE: Multidetector CT imaging through the chest, abdomen and pelvis was performed using the standard protocol during bolus administration of intravenous contrast. Multiplanar reconstructed images and MIPs were obtained and reviewed to evaluate the vascular anatomy. CONTRAST:  100 mL of Isovue 370 IV contrast COMPARISON:  Chest radiograph performed 10/02/2015, and CT of the abdomen and pelvis performed 09/09/2015. CTA of the chest performed 07/17/2013 FINDINGS: CTA CHEST FINDINGS Cardiovascular: There is no evidence of aortic dissection. There is no evidence of aneurysmal dilatation. No calcific atherosclerotic disease is seen. The great vessels are unremarkable in appearance. There is no evidence of significant pulmonary embolus. The heart is unremarkable in appearance. The thoracic aorta is unremarkable. Mediastinum/Nodes: The mediastinum is unremarkable in appearance. No mediastinal lymphadenopathy is seen. No pericardial effusion is identified. The visualized portions of the thyroid gland are unremarkable. No axillary lymphadenopathy is appreciated. Lungs/Pleura: There is a mildly mosaic pattern of parenchymal attenuation within the lungs, which may reflect a variety of conditions, including atelectasis or mild infection. No pleural effusion or pneumothorax is  seen. No masses are identified. Musculoskeletal: No acute osseous abnormalities are identified. The visualized musculature is unremarkable in appearance. Review of the MIP images confirms the above findings. CTA ABDOMEN AND PELVIS FINDINGS VASCULAR Aorta: The abdominal aorta appears fully patent. No calcific atherosclerotic disease is seen. There is no evidence of aortic dissection. There is no evidence of aneurysmal dilatation. Celiac: The celiac trunk is patent and unremarkable in appearance. SMA: The superior mesenteric artery is patent and unremarkable in appearance. Renals: Bilateral renal arteries are within normal limits. IMA: The inferior mesenteric artery is diminutive but unremarkable in appearance. Inflow: The common iliac arteries, internal and  external iliac arteries, and common femoral arteries, appear fully patent bilaterally. No calcific atherosclerotic disease is seen. Veins: The inferior vena cava is grossly unremarkable in appearance. No focal venous abnormalities are seen. Review of the MIP images confirms the above findings. NON-VASCULAR Hepatobiliary: The liver is unremarkable in appearance. The patient is status post cholecystectomy, with clips noted at the gallbladder fossa. The common bile duct remains normal in caliber. Pancreas: The pancreas is within normal limits. Spleen: The spleen is unremarkable in appearance. Adrenals/Urinary Tract: The adrenal glands are unremarkable in appearance. The kidneys are within normal limits. There is no evidence of hydronephrosis. No renal or ureteral stones are identified. No perinephric stranding is seen. Stomach/Bowel: The stomach is unremarkable in appearance. The small bowel is within normal limits. The appendix is slightly prominent, without evidence of appendicitis. The colon is unremarkable in appearance. Lymphatic: No retroperitoneal or pelvic sidewall lymphadenopathy is seen. Reproductive: The bladder is relatively decompressed and not well  assessed. The uterus contains a small uterine fibroid, but is otherwise grossly unremarkable. The ovaries are relatively symmetric. No suspicious adnexal masses are seen. Other: No additional soft tissue abnormalities are seen. Musculoskeletal: No acute osseous abnormalities are identified. The visualized musculature is unremarkable in appearance. Review of the MIP images confirms the above findings. IMPRESSION: 1. No evidence of aortic dissection. No evidence of aneurysmal dilatation. No calcific atherosclerotic disease seen. 2. No evidence of significant pulmonary embolus. 3. Mildly mosaic pattern of parenchymal attenuation within the lungs, which may reflect a variety of conditions, including atelectasis or mild infection. 4. Small uterine fibroid incidentally noted. Electronically Signed   By: Garald Balding M.D.   On: 10/03/2015 04:14    Procedures Procedures (including critical care time)  Medications Ordered in ED Medications - No data to display   Initial Impression / Assessment and Plan / ED Course  I have reviewed the triage vital signs and the nursing notes.  Pertinent labs & imaging results that were available during my care of the patient were reviewed by me and considered in my medical decision making (see chart for details).  Clinical Course   Patient is a 45 year old female presenting today with complaints of back pain radiating into her chest that's gone on for 2 days. She does complain of a dry cough but denies any fever. No symptoms similar to this in the past except when she had her gallbladder removed last year. She has no known cardiac disease but unknown family history related to being adopted. She is on birth control pills but denies any unilateral leg pain or swelling, immobilization or recent surgery. Patient's EKG is unchanged from prior. Initial troponin is within normal limits. Significantly patient does have a white blood cell count of 16,000 with a normal BMP. On exam  she has mild left upper quadrant discomfort but no reproducible chest or back pain. Patient was mildly tachycardic upon arrival with a temperature of 99. Oxygen saturation within normal limits. Patient's pain could be GI in nature we'll check LFTs and lipase. Also concern for potential PE and d-dimer is pending. Low suspicion the patient's pain is cardiac in nature.  And chest x-ray is within normal limits. Patient given morphine for pain.  2:07 AM D-dimer negative. Making less likely. Lipase is mildly elevated at 53 and patient does have left upper quadrant tenderness. Concern that this could be the cause for her pain.  Patient had a CT of her abdomen and pelvis done last week which was negative.  However  cannot r/o dissection and CT ordered  5:09 AM CT neg for dissection but possible early lung infection and with dry cough, leukocytosis and chest pain will treat with azithromycin.  Final Clinical Impressions(s) / ED Diagnoses   Final diagnoses:  Atypical chest pain  CAP (community acquired pneumonia)    New Prescriptions New Prescriptions   AZITHROMYCIN (ZITHROMAX) 250 MG TABLET    Take 1 tablet (250 mg total) by mouth daily. Take first 2 tablets together, then 1 every day until finished.   HYDROCODONE-ACETAMINOPHEN (NORCO/VICODIN) 5-325 MG TABLET    Take 1 tablet by mouth every 6 (six) hours as needed.   I personally performed the services described in this documentation, which was scribed in my presence.  The recorded information has been reviewed and considered.    Blanchie Dessert, MD 10/03/15 [phone]

## 2015-10-02 NOTE — ED Triage Notes (Signed)
Pt. reports pain across her chest for 2 days radiating to upper/mid back , denies SOB , no nausea or diaphoresis .

## 2015-10-03 ENCOUNTER — Emergency Department (HOSPITAL_COMMUNITY): Payer: BC Managed Care – PPO

## 2015-10-03 LAB — HEPATIC FUNCTION PANEL
ALBUMIN: 3.8 g/dL (ref 3.5–5.0)
ALT: 44 U/L (ref 14–54)
AST: 30 U/L (ref 15–41)
Alkaline Phosphatase: 45 U/L (ref 38–126)
Total Bilirubin: 0.4 mg/dL (ref 0.3–1.2)
Total Protein: 7 g/dL (ref 6.5–8.1)

## 2015-10-03 LAB — I-STAT BETA HCG BLOOD, ED (MC, WL, AP ONLY): I-stat hCG, quantitative: <5 m[IU]/mL (ref <5)

## 2015-10-03 LAB — I-STAT TROPONIN, ED: Troponin i, poc: 0 ng/mL (ref 0.00–0.08)

## 2015-10-03 LAB — LIPASE, BLOOD: LIPASE: 54 U/L — AB (ref 11–51)

## 2015-10-03 LAB — D-DIMER, QUANTITATIVE (NOT AT ARMC): D DIMER QUANT: 0.48 ug{FEU}/mL (ref 0.00–0.50)

## 2015-10-03 MED ORDER — MORPHINE SULFATE (PF) 4 MG/ML IV SOLN
4.0000 mg | Freq: Once | INTRAVENOUS | Status: AC
  Administered 2015-10-03: 4 mg via INTRAVENOUS
  Filled 2015-10-03: qty 1

## 2015-10-03 MED ORDER — AZITHROMYCIN 250 MG PO TABS: 250.0000 mg | ORAL_TABLET | Freq: Every day | ORAL | 0 refills | Status: DC

## 2015-10-03 MED ORDER — ONDANSETRON HCL 4 MG/2ML IJ SOLN
4.0000 mg | Freq: Once | INTRAMUSCULAR | Status: AC
  Administered 2015-10-03: 4 mg via INTRAVENOUS
  Filled 2015-10-03: qty 2

## 2015-10-03 MED ORDER — HYDROCODONE-ACETAMINOPHEN 5-325 MG PO TABS: 1.0000 | ORAL_TABLET | Freq: Four times a day (QID) | ORAL | 0 refills | Status: DC | PRN

## 2015-10-03 MED ORDER — IOPAMIDOL (ISOVUE-370) INJECTION 76%
100.0000 mL | Freq: Once | INTRAVENOUS | Status: AC | PRN
  Administered 2015-10-03: 100 mL via INTRAVENOUS

## 2015-10-03 MED ORDER — IOPAMIDOL (ISOVUE-370) INJECTION 76%
INTRAVENOUS | Status: AC
  Filled 2015-10-03: qty 100

## 2015-10-03 MED ORDER — HYDROMORPHONE HCL 1 MG/ML IJ SOLN
1.0000 mg | Freq: Once | INTRAMUSCULAR | Status: AC
  Administered 2015-10-03: 1 mg via INTRAVENOUS
  Filled 2015-10-03: qty 1

## 2015-10-03 NOTE — ED Notes (Signed)
Provided pt with PO fluids.

## 2015-11-18 ENCOUNTER — Other Ambulatory Visit: Payer: Self-pay | Admitting: *Deleted

## 2015-11-18 MED ORDER — PANTOPRAZOLE SODIUM 40 MG PO TBEC: 40.0000 mg | DELAYED_RELEASE_TABLET | Freq: Every day | ORAL | 3 refills | Status: DC

## 2015-12-12 ENCOUNTER — Telehealth (INDEPENDENT_AMBULATORY_CARE_PROVIDER_SITE_OTHER): Payer: Self-pay | Admitting: Rheumatology

## 2015-12-12 NOTE — Telephone Encounter (Signed)
Received vm from patient requesting her [place] labs from appt in Septmeber be faxed to her PCP Dr. Roe Coombs at [facility]. I faxed to [phone]

## 2015-12-23 NOTE — Progress Notes (Signed)
Scheduling pre op-- please place [place]

## 2015-12-24 ENCOUNTER — Ambulatory Visit: Payer: Self-pay | Admitting: Orthopedic Surgery

## 2015-12-24 NOTE — Progress Notes (Signed)
Please complete placing surgical orders in epic. Pt has preop appt scheduled for Friday 12/26/2015. Thanks.

## 2015-12-25 ENCOUNTER — Other Ambulatory Visit: Payer: Self-pay | Admitting: Physician Assistant

## 2015-12-25 ENCOUNTER — Ambulatory Visit: Payer: Self-pay | Admitting: Orthopedic Surgery

## 2015-12-25 DIAGNOSIS — M1990 Unspecified osteoarthritis, unspecified site: Secondary | ICD-10-CM

## 2015-12-25 NOTE — H&P (Signed)
Sarah Cobb is an 45 y.o. female.   Chief Complaint: Right knee pain HPI: The patient is a 45 year old female who presents today for follow up of their back. The patient is being followed for their back pain. They are now 10 day(s) out from flare up. Symptoms reported today include: pain. The patient states that they are doing 90 percent better. Current treatment includes: physical therapy and Cyclobenzaprine. The patient reports their current pain level to be mild. The patient has reported improvement of their symptoms with: corticosteroid use.  Sarah Cobb follows up today. She is here for recheck of her back now 10 days out from that flare up, it is doing about 90% better, still noting some stiffness in the back and buttock area as well as ongoing in her right knee. However, the pain is not radicular in nature at this point like it was last time. She finished the prednisone. She is now just taking Flexeril al as needed. She has not been able to get back to work yet as she needs a form filled out by [place] today to release her to go back to work. She did two therapy visits here for her back. She still seeing Sarah Cobb for her knees and he is aware of the issues with her back as well. Apparently, he has sent [place] a new progress note that we have not received yet. She was told by rheumatology last time she saw them with her negative rheumatology panel, there was nothing else for them to really offer her. She is also following up today for her right knee status post Euflexxa series. States she got about a week of relief out of those. She has had short term relief only with cortisone in the past as well. The knee is bothering her on a daily basis interfering with activities of daily living at this point and with her ability to walk. She did have x-rays with rheumatology over the summer. However, they were poor quality and we do not have them scanned into her chart. She is unsure of her bone density status.  Past Medical  History:  Diagnosis Date  . Anxiety   . Arrhythmia    Mitral valve regurgitation per pt- OV Dr Chancy Milroy, cardio 6/13- states had eccho and EKG  . Depression   . Family history of adverse reaction to anesthesia    don't know, she was adopted  . GERD (gastroesophageal reflux disease)   . History of hiatal hernia   . History of kidney stones   . Knee pain, right    arthrofibrosis rt knee  . Rosacea     Past Surgical History:  Procedure Laterality Date  . CHOLECYSTECTOMY N/A 01/02/2015   Procedure: LAPAROSCOPIC CHOLECYSTECTOMY WITH INTRAOPERATIVE CHOLANGIOGRAM;  Surgeon: Stark Klein, MD;  Location: [facility];  Service: General;  Laterality: N/A;  . COLONOSCOPY  03/15/2011   NORMAL  . CRYOTHERAPY     CERVIX  . FRACTURE SURGERY     2013  . KNEE ARTHROSCOPY  02/03/2012   Procedure: ARTHROSCOPY KNEE;  Surgeon: Johnn Hai, MD;  Location: [facility];  Service: Orthopedics;  Laterality: Right;  Right Knee Arthroscopy with Manipulation Under Anesthesia/Evaluation Under Anesthesia/Debridement  . ORIF PATELLA  07/22/2011   Procedure: OPEN REDUCTION INTERNAL (ORIF) FIXATION PATELLA;  Surgeon: Johnn Hai, MD;  Location: [facility];  Service: Orthopedics;  Laterality: Right;  . WISDOM TOOTH EXTRACTION      Family History  Problem Relation Age of Onset  .  Adopted: Yes   Social History:  reports that she quit smoking about 17 years ago. Her smoking use included Cigarettes. She quit after 15.00 years of use. She has never used smokeless tobacco. She reports that she does not drink alcohol or use drugs.  Allergies:  Allergies  Allergen Reactions  . Diazepam Palpitations    Tachycardia  (intolerance)     (Not in a hospital admission)  No results found for this or any previous visit (from the past 48 hour(s)). No results found.  Review of Systems  Constitutional: Negative.   HENT: Negative.   Eyes: Negative.   Respiratory: Negative.   Cardiovascular: Negative.   Gastrointestinal: Negative.    Genitourinary: Negative.   Musculoskeletal: Positive for joint pain.  Skin: Negative.   Neurological: Negative.     There were no vitals taken for this visit. Physical Exam  Constitutional: She is oriented to person, place, and time. She appears well-developed.  HENT:  Head: Normocephalic.  Eyes: Pupils are equal, round, and reactive to light.  Neck: Normal range of motion.  Cardiovascular: Normal rate.   Respiratory: Effort normal.  GI: Soft.  Musculoskeletal:  On exam, well-nourished, well-developed, awake, alert and oriented x3, in no acute distress. Slightly antalgic gait. On examination of lumbar spine, nontender of the spinous processes, paraspinus musculature, buttock and hip. Negative straight leg raise bilaterally. No lower extremity weakness. A 5/5 hip flexors, quads, hamstrings, plantar flexion, dorsiflexion, EHL. As far as examination of the right knee, positive patellofemoral pain and positive patellofemoral crepitus. She does have some mild medial and lateral joint space tenderness as well, but minimal range of motion approximately 0 to 115 degrees. No pain or laxity with varus or valgus stress. No calf pain or sign of DVT.  Neurological: She is alert and oriented to person, place, and time.    X-rays of the knee ordered, obtained and reviewed today, AP, lateral, merchant. 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 The patella does seem to be tracking properly. It is midline, more narrowing over the lateral aspect of the patellofemoral joint. Medial and lateral joint spaces do have some mild degenerative changes without collapse. Left knee great joint spaces. No significant degenerative changes.  Assessment/Plan 1. 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 2. Right knee posttraumatic DJD, patellofemoral compartment refractory to  conservative treatment including most recently viscosupplementation.  We discussed relevant anatomy, etiology of her pain. Discussed the importance of activity modifications, both back and need to avoid exacerbation. For the back, continue with PT and home exercise program and she would like to combine her PT and do not all with Sarah Cobb that is fine. Otherwise, she can continue with hair or home exercise program. Continue disc pressure management, core motion, core strengthening. She can see how she does with the back. She seems to be out of the woods on this and he has already had an MRI. If this pain flares back up in her back, we could repeat a dosepak or we discuss possible need for an epidural steroid injection. We will hold off on that for now, given her improvement. As far as the knee we have discussed options. Really given her extensive patellofemoral wear and arthroscopic debridement would likely not be beneficial. Viscosupplementation and cortisone have only provided temporary relief. The next step that we have discussed would be a patellofemoral arthroplasty, given her standing x-rays and no  significant medial and lateral joint space narrowing. I would not recommend a total knee replacement at this point. Dr. Tonita Cong will call the patient to discuss this further with her after conferring with some associates. Thus number to contact the patient is [phone]. We have also asked her to see what her bone density is that she contact her PCP for that. I have given her work note to return to work tomorrow full duty. I have also filled out her specific form from work for that. She will follow up as needed at this point for both the back and the knee. However, Dr. Tonita Cong will call her after the final decision on that right knee to discuss patellofemoral arthroplasty. The patient was seen in conjunction with Dr. Tonita Cong today.  Plan right knee patellofemoral arthroplasty  Cecilie Kicks., PA-C for Dr.  Tonita Cong 12/25/2015, 4:43 PM

## 2015-12-26 ENCOUNTER — Encounter (HOSPITAL_COMMUNITY): Payer: Self-pay | Admitting: *Deleted

## 2015-12-26 ENCOUNTER — Encounter (HOSPITAL_COMMUNITY): Payer: Self-pay

## 2015-12-26 ENCOUNTER — Encounter (HOSPITAL_COMMUNITY)
Admission: RE | Admit: 2015-12-26 | Discharge: 2015-12-26 | Disposition: A | Discharge status: Home / Self Care | Payer: BC Managed Care – PPO | Source: Ambulatory Visit | Attending: Specialist | Admitting: Specialist

## 2015-12-26 DIAGNOSIS — M797 Fibromyalgia: Secondary | ICD-10-CM | POA: Insufficient documentation

## 2015-12-26 DIAGNOSIS — F329 Major depressive disorder, single episode, unspecified: Secondary | ICD-10-CM | POA: Insufficient documentation

## 2015-12-26 DIAGNOSIS — F419 Anxiety disorder, unspecified: Secondary | ICD-10-CM | POA: Diagnosis not present

## 2015-12-26 DIAGNOSIS — I340 Nonrheumatic mitral (valve) insufficiency: Secondary | ICD-10-CM | POA: Insufficient documentation

## 2015-12-26 DIAGNOSIS — Z9049 Acquired absence of other specified parts of digestive tract: Secondary | ICD-10-CM | POA: Diagnosis not present

## 2015-12-26 DIAGNOSIS — Z01812 Encounter for preprocedural laboratory examination: Secondary | ICD-10-CM | POA: Diagnosis not present

## 2015-12-26 DIAGNOSIS — K219 Gastro-esophageal reflux disease without esophagitis: Secondary | ICD-10-CM | POA: Diagnosis not present

## 2015-12-26 DIAGNOSIS — K449 Diaphragmatic hernia without obstruction or gangrene: Secondary | ICD-10-CM | POA: Insufficient documentation

## 2015-12-26 DIAGNOSIS — M25561 Pain in right knee: Secondary | ICD-10-CM | POA: Insufficient documentation

## 2015-12-26 LAB — CBC
HEMATOCRIT: 37.8 % (ref 36.0–46.0)
HEMOGLOBIN: 12.6 g/dL (ref 12.0–15.0)
MCH: 30.7 pg (ref 26.0–34.0)
MCHC: 33.3 g/dL (ref 30.0–36.0)
MCV: 92 fL (ref 78.0–100.0)
Platelets: 266 10*3/uL (ref 150–400)
RBC: 4.11 MIL/uL (ref 3.87–5.11)
RDW: 12.9 % (ref 11.5–15.5)
WBC: 8.5 10*3/uL (ref 4.0–10.5)

## 2015-12-26 LAB — SURGICAL PCR SCREEN
MRSA, PCR: NEGATIVE
STAPHYLOCOCCUS AUREUS: NEGATIVE

## 2015-12-26 LAB — HCG, SERUM, QUALITATIVE: PREG SERUM: NEGATIVE

## 2015-12-26 LAB — BASIC METABOLIC PANEL
ANION GAP: 9 (ref 5–15)
BUN: 15 mg/dL (ref 6–20)
CO2: 27 mmol/L (ref 22–32)
Calcium: 9.5 mg/dL (ref 8.9–10.3)
Chloride: 105 mmol/L (ref 101–111)
Creatinine, Ser: 0.86 mg/dL (ref 0.44–1.00)
GFR calc Af Amer: >60 mL/min (ref >60)
Glucose, Bld: 123 mg/dL — ABNORMAL HIGH (ref 65–99)
POTASSIUM: 3.9 mmol/L (ref 3.5–5.1)
SODIUM: 141 mmol/L (ref 135–145)

## 2015-12-26 LAB — URINALYSIS, ROUTINE W REFLEX MICROSCOPIC
Bilirubin Urine: NEGATIVE
Glucose, UA: NEGATIVE mg/dL
KETONES UR: NEGATIVE mg/dL
Leukocytes, UA: NEGATIVE
Nitrite: NEGATIVE
PH: 5 (ref 5.0–8.0)
Protein, ur: NEGATIVE mg/dL
SPECIFIC GRAVITY, URINE: 1.013 (ref 1.005–1.030)

## 2015-12-26 LAB — APTT: APTT: 27 s (ref 24–36)

## 2015-12-26 LAB — PROTIME-INR
INR: 0.91
Prothrombin Time: 12.2 seconds (ref 11.4–15.2)

## 2015-12-26 LAB — ABO/RH: ABO/RH(D): B POS

## 2015-12-26 NOTE — Patient Instructions (Addendum)
Sarah Cobb  12/26/2015   Your procedure is scheduled on: 01/02/16  Report to [facility]  Entrance take [facility]  elevators to 3rd floor to  [place] at 10:30 AM.  Call this number if you have problems the morning of surgery [phone]   Remember: ONLY 1 PERSON MAY GO WITH YOU TO SHORT STAY TO GET  READY MORNING OF [place].  Do not eat food or drink liquids :After Midnight.     Take these medicines the morning of surgery with A SIP OF WATER: Lyrica, Dexilant, Ranitidine (Zantac), Oxycodone if needed, Alprazolam (Xanax) if needed.                                You may not have any metal on your body including hair pins and              piercings  Do not wear jewelry, make-up, lotions, powders or perfumes, deodorant             Do not wear nail polish.  Do not shave  48 hours prior to surgery.              Men may shave face and neck.   Do not bring valuables to the hospital. [facility].  Contacts, dentures or bridgework may not be worn into surgery.  Leave suitcase in the car. After surgery it may be brought to your room.               Please read over the following fact sheets you were given: _____________________________________________________________________             [facility] - Preparing for Surgery Before surgery, you can play an important role.  Because skin is not sterile, your skin needs to be as free of germs as possible.  You can reduce the number of germs on your skin by washing with CHG (chlorahexidine gluconate) soap before surgery.  CHG is an antiseptic cleaner which kills germs and bonds with the skin to continue killing germs even after washing. Please DO NOT use if you have an allergy to CHG or antibacterial soaps.  If your skin becomes reddened/irritated stop using the CHG and inform your nurse when you arrive at Short Stay. Do not shave (including legs and  underarms) for at least 48 hours prior to the first CHG shower.  You may shave your face/neck. Please follow these instructions carefully:  1.  Shower with CHG Soap the night before surgery and the  morning of Surgery.  2.  If you choose to wash your hair, wash your hair first as usual with your  normal  shampoo.  3.  After you shampoo, rinse your hair and body thoroughly to remove the  shampoo.                           4.  Use CHG as you would any other liquid soap.  You can apply chg directly  to the skin and wash                       Gently with a scrungie or clean washcloth.  5.  Apply the CHG Soap to your body ONLY FROM THE NECK DOWN.   Do not use on face/ open                           Wound or open sores. Avoid contact with eyes, ears mouth and genitals (private parts).                       Wash face,  Genitals (private parts) with your normal soap.             6.  Wash thoroughly, paying special attention to the area where your surgery  will be performed.  7.  Thoroughly rinse your body with warm water from the neck down.  8.  DO NOT shower/wash with your normal soap after using and rinsing off  the CHG Soap.                9.  Pat yourself dry with a clean towel.            10.  Wear clean pajamas.            11.  Place clean sheets on your bed the night of your first shower and do not  sleep with pets. Day of Surgery : Do not apply any lotions/deodorants the morning of surgery.  Please wear clean clothes to the hospital/surgery center.  FAILURE TO FOLLOW THESE INSTRUCTIONS MAY RESULT IN THE CANCELLATION OF YOUR SURGERY PATIENT SIGNATURE_________________________________  NURSE SIGNATURE__________________________________  ________________________________________________________________________   Adam Phenix  An incentive spirometer is a tool that can help keep your lungs clear and active. This tool measures how well you are filling your lungs with each breath. Taking  long deep breaths may help reverse or decrease the chance of developing breathing (pulmonary) problems (especially infection) following:  A long period of time when you are unable to move or be active. BEFORE THE PROCEDURE   If the spirometer includes an indicator to show your best effort, your nurse or respiratory therapist will set it to a desired goal.  If possible, sit up straight or lean slightly forward. Try not to slouch.  Hold the incentive spirometer in an upright position. INSTRUCTIONS FOR USE  1. Sit on the edge of your bed if possible, or sit up as far as you can in bed or on a chair. 2. Hold the incentive spirometer in an upright position. 3. Breathe out normally. 4. Place the mouthpiece in your mouth and seal your lips tightly around it. 5. Breathe in slowly and as deeply as possible, raising the piston or the ball toward the top of the column. 6. Hold your breath for 3-5 seconds or for as long as possible. Allow the piston or ball to fall to the bottom of the column. 7. Remove the mouthpiece from your mouth and breathe out normally. 8. Rest for a few seconds and repeat Steps 1 through 7 at least 10 times every 1-2 hours when you are awake. Take your time and take a few normal breaths between deep breaths. 9. The spirometer may include an indicator to show your best effort. Use the indicator as a goal to work toward during each repetition. 10. After each set of 10 deep breaths, practice coughing to be sure your lungs are clear. If you have an incision (the cut made at the time of surgery), support your incision when coughing by placing a  pillow or rolled up towels firmly against it. Once you are able to get out of bed, walk around indoors and cough well. You may stop using the incentive spirometer when instructed by your caregiver.  RISKS AND COMPLICATIONS  Take your time so you do not get dizzy or light-headed.  If you are in pain, you may need to take or ask for pain  medication before doing incentive spirometry. It is harder to take a deep breath if you are having pain. AFTER USE  Rest and breathe slowly and easily.  It can be helpful to keep track of a log of your progress. Your caregiver can provide you with a simple table to help with this. If you are using the spirometer at home, follow these instructions: [place] IF:   You are having difficultly using the spirometer.  You have trouble using the spirometer as often as instructed.  Your pain medication is not giving enough relief while using the spirometer.  You develop fever of 100.5 F (38.1 C) or higher. SEEK IMMEDIATE MEDICAL CARE IF:   You cough up bloody sputum that had not been present before.  You develop fever of 102 F (38.9 C) or greater.  You develop worsening pain at or near the incision site. MAKE SURE YOU:   Understand these instructions.  Will watch your condition.  Will get help right away if you are not doing well or get worse. Document Released: 05/03/2006 Document Revised: 03/15/2011 Document Reviewed: 07/04/2006 Exit[employer] Patient Information 2014 [employer], [place].   ________________________________________________________________________  WHAT IS A BLOOD TRANSFUSION? Blood Transfusion Information  A transfusion is the replacement of blood or some of its parts. Blood is made up of multiple cells which provide different functions.  Red blood cells carry oxygen and are used for blood loss replacement.  White blood cells fight against infection.  Platelets control bleeding.  Plasma helps clot blood.  Other blood products are available for specialized needs, such as hemophilia or other clotting disorders. BEFORE THE TRANSFUSION  Who gives blood for transfusions?   Healthy volunteers who are fully evaluated to make sure their blood is safe. This is blood bank blood. Transfusion therapy is the safest it has ever been in the practice of medicine.  Before blood is taken from a donor, a complete history is taken to make sure that person has no history of diseases nor engages in risky social behavior (examples are intravenous drug use or sexual activity with multiple partners). The donor's travel history is screened to minimize risk of transmitting infections, such as malaria. The donated blood is tested for signs of infectious diseases, such as HIV and hepatitis. The blood is then tested to be sure it is compatible with you in order to minimize the chance of a transfusion reaction. If you or a relative donates blood, this is often done in anticipation of surgery and is not appropriate for emergency situations. It takes many days to process the donated blood. RISKS AND COMPLICATIONS Although transfusion therapy is very safe and saves many lives, the main dangers of transfusion include:   Getting an infectious disease.  Developing a transfusion reaction. This is an allergic reaction to something in the blood you were given. Every precaution is taken to prevent this. The decision to have a blood transfusion has been considered carefully by your caregiver before blood is given. Blood is not given unless the benefits outweigh the risks. AFTER THE TRANSFUSION  Right after receiving a blood transfusion, you  will usually feel much better and more energetic. This is especially true if your red blood cells have gotten low (anemic). The transfusion raises the level of the red blood cells which carry oxygen, and this usually causes an energy increase.  The nurse administering the transfusion will monitor you carefully for complications. HOME CARE INSTRUCTIONS  No special instructions are needed after a transfusion. You may find your energy is better. Speak with your caregiver about any limitations on activity for underlying diseases you may have. SEEK MEDICAL CARE IF:   Your condition is not improving after your transfusion.  You develop redness or  irritation at the intravenous (IV) site. SEEK IMMEDIATE MEDICAL CARE IF:  Any of the following symptoms occur over the next 12 hours:  Shaking chills.  You have a temperature by mouth above 102 F (38.9 C), not controlled by medicine.  Chest, back, or muscle pain.  People around you feel you are not acting correctly or are confused.  Shortness of breath or difficulty breathing.  Dizziness and fainting.  You get a rash or develop hives.  You have a decrease in urine output.  Your urine turns a dark color or changes to pink, red, or brown. Any of the following symptoms occur over the next 10 days:  You have a temperature by mouth above 102 F (38.9 C), not controlled by medicine.  Shortness of breath.  Weakness after normal activity.  The white part of the eye turns yellow (jaundice).  You have a decrease in the amount of urine or are urinating less often.  Your urine turns a dark color or changes to pink, red, or brown. Document Released: 12/19/1999 Document Revised: 03/15/2011 Document Reviewed: 08/07/2007 [facility] Patient Information 2014 [place], [place].  _______________________________________________________________________

## 2015-12-26 NOTE — Pre-Procedure Instructions (Signed)
EKG 10-03-15 epic CXR 10-02-15 epic

## 2015-12-30 ENCOUNTER — Ambulatory Visit
Admission: RE | Admit: 2015-12-30 | Discharge: 2015-12-30 | Disposition: A | Discharge status: Home / Self Care | Payer: BC Managed Care – PPO | Source: Ambulatory Visit | Attending: Physician Assistant | Admitting: Physician Assistant

## 2015-12-30 ENCOUNTER — Other Ambulatory Visit: Payer: BC Managed Care – PPO

## 2015-12-30 DIAGNOSIS — M1990 Unspecified osteoarthritis, unspecified site: Secondary | ICD-10-CM

## 2016-01-02 ENCOUNTER — Ambulatory Visit (HOSPITAL_COMMUNITY): Payer: BC Managed Care – PPO | Admitting: Registered Nurse

## 2016-01-02 ENCOUNTER — Inpatient Hospital Stay (HOSPITAL_COMMUNITY): Payer: BC Managed Care – PPO

## 2016-01-02 ENCOUNTER — Inpatient Hospital Stay (HOSPITAL_COMMUNITY)
Admission: AD | Admit: 2016-01-02 | Discharge: 2016-01-07 | DRG: 470 | Disposition: A | Discharge status: Home Health | Payer: BC Managed Care – PPO | Source: Ambulatory Visit | Attending: Specialist | Admitting: Specialist

## 2016-01-02 ENCOUNTER — Encounter (HOSPITAL_COMMUNITY)
Admission: AD | Disposition: A | Discharge status: Home Health | Payer: Self-pay | Source: Ambulatory Visit | Attending: Specialist

## 2016-01-02 ENCOUNTER — Encounter (HOSPITAL_COMMUNITY): Payer: Self-pay | Admitting: Registered Nurse

## 2016-01-02 DIAGNOSIS — F419 Anxiety disorder, unspecified: Secondary | ICD-10-CM | POA: Diagnosis present

## 2016-01-02 DIAGNOSIS — L03115 Cellulitis of right lower limb: Secondary | ICD-10-CM | POA: Diagnosis not present

## 2016-01-02 DIAGNOSIS — B999 Unspecified infectious disease: Secondary | ICD-10-CM

## 2016-01-02 DIAGNOSIS — M1731 Unilateral post-traumatic osteoarthritis, right knee: Principal | ICD-10-CM | POA: Diagnosis present

## 2016-01-02 DIAGNOSIS — I340 Nonrheumatic mitral (valve) insufficiency: Secondary | ICD-10-CM | POA: Diagnosis present

## 2016-01-02 DIAGNOSIS — M1711 Unilateral primary osteoarthritis, right knee: Secondary | ICD-10-CM

## 2016-01-02 DIAGNOSIS — F329 Major depressive disorder, single episode, unspecified: Secondary | ICD-10-CM | POA: Diagnosis present

## 2016-01-02 DIAGNOSIS — R739 Hyperglycemia, unspecified: Secondary | ICD-10-CM | POA: Diagnosis not present

## 2016-01-02 DIAGNOSIS — D649 Anemia, unspecified: Secondary | ICD-10-CM | POA: Diagnosis present

## 2016-01-02 DIAGNOSIS — Z96659 Presence of unspecified artificial knee joint: Secondary | ICD-10-CM

## 2016-01-02 DIAGNOSIS — K219 Gastro-esophageal reflux disease without esophagitis: Secondary | ICD-10-CM | POA: Diagnosis present

## 2016-01-02 DIAGNOSIS — M79609 Pain in unspecified limb: Secondary | ICD-10-CM | POA: Diagnosis not present

## 2016-01-02 DIAGNOSIS — Z87891 Personal history of nicotine dependence: Secondary | ICD-10-CM | POA: Diagnosis not present

## 2016-01-02 DIAGNOSIS — M7989 Other specified soft tissue disorders: Secondary | ICD-10-CM | POA: Diagnosis not present

## 2016-01-02 DIAGNOSIS — S8011XD Contusion of right lower leg, subsequent encounter: Secondary | ICD-10-CM | POA: Diagnosis not present

## 2016-01-02 DIAGNOSIS — M797 Fibromyalgia: Secondary | ICD-10-CM | POA: Diagnosis present

## 2016-01-02 DIAGNOSIS — M79604 Pain in right leg: Secondary | ICD-10-CM

## 2016-01-02 DIAGNOSIS — M25561 Pain in right knee: Secondary | ICD-10-CM

## 2016-01-02 DIAGNOSIS — L0390 Cellulitis, unspecified: Secondary | ICD-10-CM | POA: Diagnosis present

## 2016-01-02 HISTORY — DX: Nonrheumatic mitral (valve) insufficiency: I34.0

## 2016-01-02 HISTORY — DX: Fibromyalgia: M79.7

## 2016-01-02 HISTORY — PX: PATELLA-FEMORAL ARTHROPLASTY: SHX5037

## 2016-01-02 HISTORY — DX: Pneumonia, unspecified organism: J18.9

## 2016-01-02 LAB — TYPE AND SCREEN
ABO/RH(D): B POS
Antibody Screen: NEGATIVE

## 2016-01-02 SURGERY — ARTHROPLASTY, PATELLOFEMORAL
Anesthesia: Regional | Site: Knee | Laterality: Right

## 2016-01-02 MED ORDER — OXYCODONE-ACETAMINOPHEN 5-325 MG PO TABS: 1.0000 | ORAL_TABLET | ORAL | 0 refills | Status: DC | PRN

## 2016-01-02 MED ORDER — PROPOFOL 10 MG/ML IV BOLUS
INTRAVENOUS | Status: AC
  Filled 2016-01-02: qty 20

## 2016-01-02 MED ORDER — CYCLOBENZAPRINE HCL 10 MG PO TABS: 10.0000 mg | ORAL_TABLET | Freq: Three times a day (TID) | ORAL | 2 refills | Status: AC | PRN

## 2016-01-02 MED ORDER — LACTATED RINGERS IV SOLN
INTRAVENOUS | Status: DC
  Administered 2016-01-02 (×3): via INTRAVENOUS

## 2016-01-02 MED ORDER — METHOCARBAMOL 1000 MG/10ML IJ SOLN
500.0000 mg | Freq: Four times a day (QID) | INTRAVENOUS | Status: DC | PRN
  Filled 2016-01-02: qty 5

## 2016-01-02 MED ORDER — RISAQUAD PO CAPS
1.0000 | ORAL_CAPSULE | Freq: Every day | ORAL | Status: DC
  Administered 2016-01-02 – 2016-01-07 (×6): 1 via ORAL
  Filled 2016-01-02 (×6): qty 1

## 2016-01-02 MED ORDER — BISACODYL 5 MG PO TBEC: 5.0000 mg | DELAYED_RELEASE_TABLET | Freq: Every day | ORAL | Status: DC | PRN

## 2016-01-02 MED ORDER — ALUM & MAG HYDROXIDE-SIMETH 200-200-20 MG/5ML PO SUSP: 30.0000 mL | ORAL | Status: DC | PRN

## 2016-01-02 MED ORDER — SUGAMMADEX SODIUM 200 MG/2ML IV SOLN
INTRAVENOUS | Status: DC | PRN
  Administered 2016-01-02: 150 mg via INTRAVENOUS

## 2016-01-02 MED ORDER — FENTANYL CITRATE (PF) 100 MCG/2ML IJ SOLN
INTRAMUSCULAR | Status: AC
  Filled 2016-01-02: qty 2

## 2016-01-02 MED ORDER — BUPIVACAINE HCL (PF) 0.25 % IJ SOLN
INTRAMUSCULAR | Status: DC | PRN
  Administered 2016-01-02: 30 mL

## 2016-01-02 MED ORDER — MIDAZOLAM HCL 5 MG/5ML IJ SOLN
INTRAMUSCULAR | Status: DC | PRN
  Administered 2016-01-02: 1 mg via INTRAVENOUS
  Administered 2016-01-02: 2 mg via INTRAVENOUS

## 2016-01-02 MED ORDER — MAGNESIUM CITRATE PO SOLN: 1.0000 | Freq: Once | ORAL | Status: DC | PRN

## 2016-01-02 MED ORDER — PROPOFOL 10 MG/ML IV BOLUS
INTRAVENOUS | Status: AC
  Filled 2016-01-02: qty 40

## 2016-01-02 MED ORDER — ROCURONIUM BROMIDE 100 MG/10ML IV SOLN
INTRAVENOUS | Status: DC | PRN
  Administered 2016-01-02: 5 mg via INTRAVENOUS
  Administered 2016-01-02: 35 mg via INTRAVENOUS

## 2016-01-02 MED ORDER — HYDROMORPHONE HCL 1 MG/ML IJ SOLN
INTRAMUSCULAR | Status: DC | PRN
  Administered 2016-01-02 (×2): 0.5 mg via INTRAVENOUS
  Administered 2016-01-02: 1 mg via INTRAVENOUS

## 2016-01-02 MED ORDER — SODIUM CHLORIDE 0.9 % IR SOLN
Status: DC | PRN
  Administered 2016-01-02: 500 mL

## 2016-01-02 MED ORDER — LORATADINE 10 MG PO TABS
10.0000 mg | ORAL_TABLET | Freq: Every day | ORAL | Status: DC
  Administered 2016-01-05: 10 mg via ORAL
  Filled 2016-01-02 (×5): qty 1

## 2016-01-02 MED ORDER — POLYETHYLENE GLYCOL 3350 17 G PO PACK
17.0000 g | PACK | Freq: Every day | ORAL | Status: DC | PRN
Start: 2016-01-02 — End: 2016-01-07

## 2016-01-02 MED ORDER — POLYETHYLENE GLYCOL 3350 17 G PO PACK: 17.0000 g | PACK | Freq: Every day | ORAL | 0 refills | Status: DC

## 2016-01-02 MED ORDER — FAMOTIDINE 20 MG PO TABS
20.0000 mg | ORAL_TABLET | Freq: Every day | ORAL | Status: DC
  Administered 2016-01-02 – 2016-01-06 (×5): 20 mg via ORAL
  Filled 2016-01-02 (×5): qty 1

## 2016-01-02 MED ORDER — CYCLOBENZAPRINE HCL 10 MG PO TABS
10.0000 mg | ORAL_TABLET | Freq: Three times a day (TID) | ORAL | Status: DC | PRN
  Administered 2016-01-02 – 2016-01-07 (×8): 10 mg via ORAL
  Filled 2016-01-02 (×8): qty 1

## 2016-01-02 MED ORDER — HYDROMORPHONE HCL 2 MG/ML IJ SOLN
INTRAMUSCULAR | Status: AC
  Filled 2016-01-02: qty 1

## 2016-01-02 MED ORDER — PANTOPRAZOLE SODIUM 40 MG PO TBEC
40.0000 mg | DELAYED_RELEASE_TABLET | Freq: Every day | ORAL | Status: DC
  Administered 2016-01-05: 40 mg via ORAL
  Filled 2016-01-02 (×5): qty 1

## 2016-01-02 MED ORDER — TRAMADOL HCL 50 MG PO TABS
50.0000 mg | ORAL_TABLET | Freq: Four times a day (QID) | ORAL | Status: DC | PRN
  Administered 2016-01-04: 50 mg via ORAL
  Filled 2016-01-02 (×2): qty 1

## 2016-01-02 MED ORDER — ROPIVACAINE HCL 7.5 MG/ML IJ SOLN
INTRAMUSCULAR | Status: DC | PRN
  Administered 2016-01-02: 20 mL via PERINEURAL

## 2016-01-02 MED ORDER — HYDROMORPHONE HCL 1 MG/ML IJ SOLN
1.0000 mg | INTRAMUSCULAR | Status: DC | PRN
  Administered 2016-01-02 – 2016-01-05 (×9): 1 mg via INTRAVENOUS
  Filled 2016-01-02 (×10): qty 1

## 2016-01-02 MED ORDER — PROPOFOL 10 MG/ML IV BOLUS
INTRAVENOUS | Status: DC | PRN
  Administered 2016-01-02: 200 mg via INTRAVENOUS

## 2016-01-02 MED ORDER — ONDANSETRON HCL 4 MG/2ML IJ SOLN
INTRAMUSCULAR | Status: AC
  Filled 2016-01-02: qty 2

## 2016-01-02 MED ORDER — KCL IN DEXTROSE-NACL 20-5-0.45 MEQ/L-%-% IV SOLN
INTRAVENOUS | Status: AC
  Administered 2016-01-02: 20:00:00 via INTRAVENOUS
  Filled 2016-01-02 (×2): qty 1000

## 2016-01-02 MED ORDER — NORETHIN-ETH ESTRAD-FE BIPHAS 1 MG-10 MCG / 10 MCG PO TABS
1.0000 | ORAL_TABLET | Freq: Every morning | ORAL | Status: DC
  Administered 2016-01-05 – 2016-01-06 (×2): 1 via ORAL

## 2016-01-02 MED ORDER — HYDROMORPHONE HCL 1 MG/ML IJ SOLN
0.2500 mg | INTRAMUSCULAR | Status: DC | PRN
  Administered 2016-01-02 (×2): 0.5 mg via INTRAVENOUS

## 2016-01-02 MED ORDER — DOCUSATE SODIUM 100 MG PO CAPS: 100.0000 mg | ORAL_CAPSULE | Freq: Two times a day (BID) | ORAL | 1 refills | Status: DC | PRN

## 2016-01-02 MED ORDER — PREGABALIN 50 MG PO CAPS
50.0000 mg | ORAL_CAPSULE | Freq: Two times a day (BID) | ORAL | Status: DC
  Administered 2016-01-02 – 2016-01-07 (×10): 50 mg via ORAL
  Filled 2016-01-02 (×10): qty 1

## 2016-01-02 MED ORDER — DEXAMETHASONE SODIUM PHOSPHATE 10 MG/ML IJ SOLN
INTRAMUSCULAR | Status: AC
  Filled 2016-01-02: qty 1

## 2016-01-02 MED ORDER — CEFAZOLIN SODIUM-DEXTROSE 2-4 GM/100ML-% IV SOLN
INTRAVENOUS | Status: AC
  Filled 2016-01-02: qty 100

## 2016-01-02 MED ORDER — ALPRAZOLAM 0.25 MG PO TABS
0.2500 mg | ORAL_TABLET | Freq: Three times a day (TID) | ORAL | Status: DC | PRN
  Administered 2016-01-04: 0.25 mg via ORAL
  Filled 2016-01-02: qty 1

## 2016-01-02 MED ORDER — CEFAZOLIN SODIUM-DEXTROSE 2-4 GM/100ML-% IV SOLN
2.0000 g | INTRAVENOUS | Status: AC
  Administered 2016-01-02: 2 g via INTRAVENOUS
  Filled 2016-01-02: qty 100

## 2016-01-02 MED ORDER — ASPIRIN EC 325 MG PO TBEC
325.0000 mg | DELAYED_RELEASE_TABLET | Freq: Two times a day (BID) | ORAL | Status: DC
  Administered 2016-01-03 – 2016-01-07 (×9): 325 mg via ORAL
  Filled 2016-01-02 (×9): qty 1

## 2016-01-02 MED ORDER — MIDAZOLAM HCL 2 MG/2ML IJ SOLN
INTRAMUSCULAR | Status: AC
  Filled 2016-01-02: qty 2

## 2016-01-02 MED ORDER — LACTATED RINGERS IV SOLN
INTRAVENOUS | Status: DC
  Administered 2016-01-02: 16:00:00 via INTRAVENOUS

## 2016-01-02 MED ORDER — METHOCARBAMOL 500 MG PO TABS: 500.0000 mg | ORAL_TABLET | Freq: Four times a day (QID) | ORAL | 1 refills | Status: DC | PRN

## 2016-01-02 MED ORDER — OXYCODONE HCL 5 MG PO TABS
5.0000 mg | ORAL_TABLET | ORAL | Status: DC | PRN
  Administered 2016-01-02: 5 mg via ORAL
  Administered 2016-01-03 – 2016-01-07 (×7): 10 mg via ORAL
  Filled 2016-01-02: qty 1
  Filled 2016-01-02 (×10): qty 2

## 2016-01-02 MED ORDER — ROCURONIUM BROMIDE 50 MG/5ML IV SOSY
PREFILLED_SYRINGE | INTRAVENOUS | Status: AC
  Filled 2016-01-02: qty 5

## 2016-01-02 MED ORDER — DOCUSATE SODIUM 100 MG PO CAPS
100.0000 mg | ORAL_CAPSULE | Freq: Two times a day (BID) | ORAL | Status: DC
  Administered 2016-01-02 – 2016-01-07 (×10): 100 mg via ORAL
  Filled 2016-01-02 (×10): qty 1

## 2016-01-02 MED ORDER — PHENYLEPHRINE 40 MCG/ML (10ML) SYRINGE FOR IV PUSH (FOR BLOOD PRESSURE SUPPORT)
PREFILLED_SYRINGE | INTRAVENOUS | Status: AC
  Filled 2016-01-02: qty 10

## 2016-01-02 MED ORDER — ONDANSETRON HCL 4 MG/2ML IJ SOLN
INTRAMUSCULAR | Status: DC | PRN
  Administered 2016-01-02: 4 mg via INTRAVENOUS

## 2016-01-02 MED ORDER — BUPIVACAINE HCL (PF) 0.25 % IJ SOLN
INTRAMUSCULAR | Status: AC
  Filled 2016-01-02: qty 30

## 2016-01-02 MED ORDER — SODIUM CHLORIDE 0.9 % IR SOLN
Status: DC | PRN
  Administered 2016-01-02: 1000 mL

## 2016-01-02 MED ORDER — LABETALOL HCL 5 MG/ML IV SOLN
INTRAVENOUS | Status: DC | PRN
  Administered 2016-01-02: 2.5 mg via INTRAVENOUS

## 2016-01-02 MED ORDER — LIDOCAINE HCL (CARDIAC) 20 MG/ML IV SOLN
INTRAVENOUS | Status: DC | PRN
  Administered 2016-01-02: 25 mg via INTRATRACHEAL
  Administered 2016-01-02: 75 mg via INTRAVENOUS

## 2016-01-02 MED ORDER — LIDOCAINE-EPINEPHRINE (PF) 1 %-1:200000 IJ SOLN
INTRAMUSCULAR | Status: DC | PRN
  Administered 2016-01-02: 30 mL

## 2016-01-02 MED ORDER — SUCCINYLCHOLINE CHLORIDE 20 MG/ML IJ SOLN
INTRAMUSCULAR | Status: DC | PRN
  Administered 2016-01-02: 120 mg via INTRAVENOUS

## 2016-01-02 MED ORDER — ACETAMINOPHEN 10 MG/ML IV SOLN
INTRAVENOUS | Status: DC | PRN
  Administered 2016-01-02: 1000 mg via INTRAVENOUS

## 2016-01-02 MED ORDER — SUCCINYLCHOLINE CHLORIDE 200 MG/10ML IV SOSY
PREFILLED_SYRINGE | INTRAVENOUS | Status: AC
  Filled 2016-01-02: qty 10

## 2016-01-02 MED ORDER — METOCLOPRAMIDE HCL 5 MG/ML IJ SOLN: 5.0000 mg | Freq: Three times a day (TID) | INTRAMUSCULAR | Status: DC | PRN

## 2016-01-02 MED ORDER — METOCLOPRAMIDE HCL 5 MG PO TABS
5.0000 mg | ORAL_TABLET | Freq: Three times a day (TID) | ORAL | Status: DC | PRN
  Administered 2016-01-04: 10 mg via ORAL
  Filled 2016-01-02: qty 2

## 2016-01-02 MED ORDER — MENTHOL 3 MG MT LOZG: 1.0000 | LOZENGE | OROMUCOSAL | Status: DC | PRN

## 2016-01-02 MED ORDER — DIPHENHYDRAMINE HCL 12.5 MG/5ML PO ELIX
12.5000 mg | ORAL_SOLUTION | ORAL | Status: DC | PRN
  Administered 2016-01-03: 12.5 mg via ORAL
  Filled 2016-01-02: qty 5

## 2016-01-02 MED ORDER — HYDROMORPHONE HCL 1 MG/ML IJ SOLN
INTRAMUSCULAR | Status: AC
  Administered 2016-01-03: 1 mg via INTRAVENOUS
  Filled 2016-01-02: qty 1

## 2016-01-02 MED ORDER — SUGAMMADEX SODIUM 200 MG/2ML IV SOLN
INTRAVENOUS | Status: AC
  Filled 2016-01-02: qty 2

## 2016-01-02 MED ORDER — ROPIVACAINE HCL 7.5 MG/ML IJ SOLN
INTRAMUSCULAR | Status: AC
  Filled 2016-01-02: qty 20

## 2016-01-02 MED ORDER — LIDOCAINE-EPINEPHRINE (PF) 1 %-1:200000 IJ SOLN
INTRAMUSCULAR | Status: AC
  Filled 2016-01-02: qty 30

## 2016-01-02 MED ORDER — ONDANSETRON HCL 4 MG PO TABS
4.0000 mg | ORAL_TABLET | Freq: Four times a day (QID) | ORAL | Status: DC | PRN
  Administered 2016-01-04 – 2016-01-05 (×2): 4 mg via ORAL
  Filled 2016-01-02 (×2): qty 1

## 2016-01-02 MED ORDER — PROMETHAZINE HCL 25 MG/ML IJ SOLN: 6.2500 mg | INTRAMUSCULAR | Status: DC | PRN

## 2016-01-02 MED ORDER — ONDANSETRON HCL 4 MG PO TABS: 4.0000 mg | ORAL_TABLET | Freq: Three times a day (TID) | ORAL | Status: DC | PRN

## 2016-01-02 MED ORDER — CEFAZOLIN SODIUM-DEXTROSE 2-4 GM/100ML-% IV SOLN
2.0000 g | Freq: Four times a day (QID) | INTRAVENOUS | Status: AC
  Administered 2016-01-02 – 2016-01-03 (×3): 2 g via INTRAVENOUS
  Filled 2016-01-02 (×3): qty 100

## 2016-01-02 MED ORDER — ACETAMINOPHEN 325 MG PO TABS
650.0000 mg | ORAL_TABLET | Freq: Four times a day (QID) | ORAL | Status: DC | PRN
  Administered 2016-01-04: 650 mg via ORAL
  Filled 2016-01-02: qty 2

## 2016-01-02 MED ORDER — ACETAMINOPHEN 10 MG/ML IV SOLN
INTRAVENOUS | Status: AC
  Filled 2016-01-02: qty 100

## 2016-01-02 MED ORDER — ACETAMINOPHEN 650 MG RE SUPP: 650.0000 mg | Freq: Four times a day (QID) | RECTAL | Status: DC | PRN

## 2016-01-02 MED ORDER — METHOCARBAMOL 500 MG PO TABS: 500.0000 mg | ORAL_TABLET | Freq: Four times a day (QID) | ORAL | Status: DC | PRN

## 2016-01-02 MED ORDER — FENTANYL CITRATE (PF) 100 MCG/2ML IJ SOLN
INTRAMUSCULAR | Status: DC | PRN
  Administered 2016-01-02 (×2): 50 ug via INTRAVENOUS
  Administered 2016-01-02: 25 ug via INTRAVENOUS
  Administered 2016-01-02 (×3): 50 ug via INTRAVENOUS
  Administered 2016-01-02: 100 ug via INTRAVENOUS
  Administered 2016-01-02: 25 ug via INTRAVENOUS

## 2016-01-02 MED ORDER — SUMATRIPTAN SUCCINATE 50 MG PO TABS
50.0000 mg | ORAL_TABLET | Freq: Every day | ORAL | Status: DC | PRN
  Filled 2016-01-02: qty 1

## 2016-01-02 MED ORDER — ONDANSETRON HCL 4 MG/2ML IJ SOLN: 4.0000 mg | Freq: Four times a day (QID) | INTRAMUSCULAR | Status: DC | PRN

## 2016-01-02 MED ORDER — PHENYLEPHRINE HCL 10 MG/ML IJ SOLN
INTRAMUSCULAR | Status: DC | PRN
  Administered 2016-01-02 (×2): 80 ug via INTRAVENOUS

## 2016-01-02 MED ORDER — ASPIRIN EC 325 MG PO TBEC: 325.0000 mg | DELAYED_RELEASE_TABLET | Freq: Two times a day (BID) | ORAL | Status: DC

## 2016-01-02 MED ORDER — MEPERIDINE HCL 50 MG/ML IJ SOLN: 6.2500 mg | INTRAMUSCULAR | Status: DC | PRN

## 2016-01-02 MED ORDER — SODIUM CHLORIDE 0.9 % IR SOLN
Status: AC
  Filled 2016-01-02: qty 500000

## 2016-01-02 MED ORDER — PHENOL 1.4 % MT LIQD: 1.0000 | OROMUCOSAL | Status: DC | PRN

## 2016-01-02 MED ORDER — ASPIRIN EC 325 MG PO TBEC: 325.0000 mg | DELAYED_RELEASE_TABLET | Freq: Two times a day (BID) | ORAL | 1 refills | Status: DC

## 2016-01-02 MED ORDER — PANTOPRAZOLE SODIUM 40 MG PO TBEC: 40.0000 mg | DELAYED_RELEASE_TABLET | Freq: Every day | ORAL | Status: DC

## 2016-01-02 SURGICAL SUPPLY — 63 items
BAG ZIPLOCK 12X15 (MISCELLANEOUS) IMPLANT
BANDAGE ACE 4X5 VEL STRL LF (GAUZE/BANDAGES/DRESSINGS) ×2 IMPLANT
BANDAGE ACE 6X5 VEL STRL LF (GAUZE/BANDAGES/DRESSINGS) ×2 IMPLANT
BANDAGE ELASTIC 4 VELCRO ST LF (GAUZE/BANDAGES/DRESSINGS) ×2 IMPLANT
BANDAGE ELASTIC 6 VELCRO ST LF (GAUZE/BANDAGES/DRESSINGS) ×2 IMPLANT
BLADE SAG 18X100X1.27 (BLADE) ×2 IMPLANT
BLADE SAW SGTL 13.0X1.19X90.0M (BLADE) ×2 IMPLANT
BUR SURG PFJ MILL NEXGEN (Knees) ×2 IMPLANT
BURR SURG PFJ MILL NEXGEN (Knees) ×4 IMPLANT
CAPT KNEE PARTIAL 2 ×2 IMPLANT
CEMENT HV SMART SET (Cement) ×4 IMPLANT
CLOTH 2% CHLOROHEXIDINE 3PK (PERSONAL CARE ITEMS) ×2 IMPLANT
CUFF TOURN SGL QUICK 34 (TOURNIQUET CUFF) ×1
CUFF TRNQT CYL 34X4X40X1 (TOURNIQUET CUFF) ×1 IMPLANT
DECANTER SPIKE VIAL GLASS SM (MISCELLANEOUS) ×2 IMPLANT
DRAPE INCISE IOBAN 66X45 STRL (DRAPES) IMPLANT
DRAPE ORTHO SPLIT 77X108 STRL (DRAPES) ×2
DRAPE SHEET LG 3/4 BI-LAMINATE (DRAPES) ×2 IMPLANT
DRAPE SURG ORHT 6 SPLT 77X108 (DRAPES) ×2 IMPLANT
DRAPE U-SHAPE 47X51 STRL (DRAPES) ×2 IMPLANT
DRSG AQUACEL AG ADV 3.5X10 (GAUZE/BANDAGES/DRESSINGS) ×2 IMPLANT
DRSG TEGADERM 4X4.75 (GAUZE/BANDAGES/DRESSINGS) IMPLANT
DURAPREP 26ML APPLICATOR (WOUND CARE) ×2 IMPLANT
ELECT REM PT RETURN 9FT ADLT (ELECTROSURGICAL) ×2
ELECTRODE REM PT RTRN 9FT ADLT (ELECTROSURGICAL) ×1 IMPLANT
EVACUATOR 1/8 PVC DRAIN (DRAIN) IMPLANT
GAUZE SPONGE 2X2 8PLY STRL LF (GAUZE/BANDAGES/DRESSINGS) IMPLANT
GLOVE BIOGEL PI IND STRL 7.0 (GLOVE) ×1 IMPLANT
GLOVE BIOGEL PI IND STRL 8 (GLOVE) ×1 IMPLANT
GLOVE BIOGEL PI INDICATOR 7.0 (GLOVE) ×1
GLOVE BIOGEL PI INDICATOR 8 (GLOVE) ×1
GLOVE SURG SS PI 7.0 STRL IVOR (GLOVE) ×2 IMPLANT
GLOVE SURG SS PI 7.5 STRL IVOR (GLOVE) ×2 IMPLANT
GLOVE SURG SS PI 8.0 STRL IVOR (GLOVE) ×4 IMPLANT
GOWN STRL REUS W/TWL XL LVL3 (GOWN DISPOSABLE) ×4 IMPLANT
HANDPIECE INTERPULSE COAX TIP (DISPOSABLE) ×1
HEMOSTAT SPONGE AVITENE ULTRA (HEMOSTASIS) ×2 IMPLANT
IMMOBILIZER KNEE 20 (SOFTGOODS) ×4 IMPLANT
IMMOBILIZER KNEE 20 THIGH 36 (SOFTGOODS) ×1 IMPLANT
MANIFOLD NEPTUNE II (INSTRUMENTS) ×2 IMPLANT
NS IRRIG 1000ML POUR BTL (IV SOLUTION) ×2 IMPLANT
PACK TOTAL KNEE CUSTOM (KITS) ×2 IMPLANT
POSITIONER SURGICAL ARM (MISCELLANEOUS) ×2 IMPLANT
SET HNDPC FAN SPRY TIP SCT (DISPOSABLE) ×1 IMPLANT
SPONGE GAUZE 2X2 STER 10/PKG (GAUZE/BANDAGES/DRESSINGS)
SPONGE SURGIFOAM ABS GEL 100 (HEMOSTASIS) IMPLANT
STAPLER VISISTAT (STAPLE) IMPLANT
STRIP CLOSURE SKIN 1/2X4 (GAUZE/BANDAGES/DRESSINGS) ×2 IMPLANT
SUT BONE WAX W31G (SUTURE) IMPLANT
SUT MNCRL AB 4-0 PS2 18 (SUTURE) IMPLANT
SUT STRATAFIX 0 PDS 27 VIOLET (SUTURE) ×2
SUT VIC AB 1 CT1 27 (SUTURE) ×2
SUT VIC AB 1 CT1 27XBRD ANTBC (SUTURE) ×2 IMPLANT
SUT VIC AB 2-0 CT1 27 (SUTURE) ×3
SUT VIC AB 2-0 CT1 TAPERPNT 27 (SUTURE) ×3 IMPLANT
SUTURE STRATFX 0 PDS 27 VIOLET (SUTURE) ×1 IMPLANT
SYR 50ML LL SCALE MARK (SYRINGE) ×2 IMPLANT
TOWER CARTRIDGE SMART MIX (DISPOSABLE) ×2 IMPLANT
TRAY FOLEY W/METER SILVER 14FR (SET/KITS/TRAYS/PACK) ×2 IMPLANT
TRAY FOLEY W/METER SILVER 16FR (SET/KITS/TRAYS/PACK) IMPLANT
WATER STERILE IRR 1500ML POUR (IV SOLUTION) IMPLANT
WRAP KNEE MAXI GEL POST OP (GAUZE/BANDAGES/DRESSINGS) ×2 IMPLANT
YANKAUER SUCT BULB TIP 10FT TU (MISCELLANEOUS) ×2 IMPLANT

## 2016-01-02 NOTE — Interval H&P Note (Signed)
History and Physical Interval Note:  01/02/2016 11:56 AM  Sarah Cobb  has presented today for surgery, with the diagnosis of Right knee post traumatic patellofemoral arthrosis  The various methods of treatment have been discussed with the patient and family. After consideration of risks, benefits and other options for treatment, the patient has consented to  Procedure(s) with comments: RIGHT KNEE PATELLA-FEMORAL ARTHROPLASTY (Right) - Requests 2.5 hrs as a surgical intervention .  The patient's history has been reviewed, patient examined, no change in status, stable for surgery.  I have reviewed the patient's chart and labs.  Questions were answered to the patient's satisfaction.     Loriann Bosserman C

## 2016-01-02 NOTE — Anesthesia Preprocedure Evaluation (Addendum)
Anesthesia Evaluation  Patient identified by MRN, date of birth, ID band Patient awake    Reviewed: Allergy & Precautions, H&P , NPO status , Patient's Chart, lab work & pertinent test results  Airway Mallampati: II  TM Distance: <3 FB Neck ROM: Full    Dental no notable dental hx. (+) Dental Advisory Given   Pulmonary pneumonia, resolved, former smoker,    Pulmonary exam normal breath sounds clear to auscultation       Cardiovascular Normal cardiovascular exam(-) dysrhythmias + Valvular Problems/Murmurs MR  Rhythm:Regular Rate:Normal     Neuro/Psych  Headaches, PSYCHIATRIC DISORDERS Depression negative neurological ROS     GI/Hepatic Neg liver ROS, hiatal hernia, GERD  Medicated,  Endo/Other  negative endocrine ROS  Renal/GU negative Renal ROS     Musculoskeletal  (+) Fibromyalgia -  Abdominal   Peds  Hematology negative hematology ROS (+)   Anesthesia Other Findings   Reproductive/Obstetrics negative OB ROS                             Anesthesia Physical  Anesthesia Plan  ASA: II  Anesthesia Plan: Regional and General   Post-op Pain Management: GA combined w/ Regional for post-op pain   Induction: Intravenous  Airway Management Planned: Oral ETT  Additional Equipment:   Intra-op Plan:   Post-operative Plan: Extubation in OR  Informed Consent: I have reviewed the patients History and Physical, chart, labs and discussed the procedure including the risks, benefits and alternatives for the proposed anesthesia with the patient or authorized representative who has indicated his/her understanding and acceptance.   Dental advisory given  Plan Discussed with: CRNA and Surgeon  Anesthesia Plan Comments:        Anesthesia Quick Evaluation

## 2016-01-02 NOTE — Progress Notes (Signed)
Assisted Dr. Germeroth with right, ultrasound guided, adductor canal block. Side rails up, monitors on throughout procedure. See vital signs in flow sheet. Tolerated Procedure well. 

## 2016-01-02 NOTE — Brief Op Note (Signed)
01/02/2016  2:11 PM  PATIENT:  Sarah Cobb  45 y.o. female  PRE-OPERATIVE DIAGNOSIS:  Right knee post traumatic patellofemoral arthrosis  POST-OPERATIVE DIAGNOSIS:  Right knee post traumatic patellofemoral arthrosis  PROCEDURE:  Procedure(s) with comments: RIGHT KNEE PATELLA-FEMORAL ARTHROPLASTY (Right) - Requests 2.5 hrs  SURGEON:  Surgeon(s) and Role:    * Susa Day, MD - Primary    * Rod Can, MD - Assisting  PHYSICIAN ASSISTANT:   ASSISTANTS: [place]   ANESTHESIA:   general  EBL:  Total I/O In: 2000 [I.V.:2000] Out: 175 [Urine:125; Blood:50]  BLOOD ADMINISTERED:none  DRAINS: none   LOCAL MEDICATIONS USED:  MARCAINE     SPECIMEN:  No Specimen  DISPOSITION OF SPECIMEN:  N/A  COUNTS:  YES  TOURNIQUET:   Total Tourniquet Time Documented: Thigh (Right) - 66 minutes Total: Thigh (Right) - 66 minutes   DICTATION: .Other Dictation: Dictation Number [id number]  PLAN OF CARE: Admit for overnight observation  PATIENT DISPOSITION:  PACU - hemodynamically stable.   Delay start of Pharmacological VTE agent (>24hrs) due to surgical blood loss or risk of bleeding: no

## 2016-01-02 NOTE — Anesthesia Postprocedure Evaluation (Signed)
Anesthesia Post Note  Patient: Sarah Cobb  Procedure(s) Performed: Procedure(s) (LRB): RIGHT KNEE PATELLA-FEMORAL ARTHROPLASTY (Right)  Patient location during evaluation: PACU Anesthesia Type: General Level of consciousness: sedated and patient cooperative Pain management: pain level controlled Vital Signs Assessment: post-procedure vital signs reviewed and stable Respiratory status: spontaneous breathing Cardiovascular status: stable Anesthetic complications: no       Last Vitals:  Vitals:   01/02/16 1652 01/02/16 1754  BP: 110/68 110/66  Pulse:  95  Resp: 14 16  Temp: 37.1 C 36.8 C    Last Pain:  Vitals:   01/02/16 1754  TempSrc: Oral  PainSc:                  Nolon Nations

## 2016-01-02 NOTE — Anesthesia Procedure Notes (Signed)
Procedure Name: Intubation Date/Time: 01/02/2016 12:18 PM Performed by: Lissa Morales Pre-anesthesia Checklist: Patient identified, Emergency Drugs available, Suction available and Patient being monitored Patient Re-evaluated:Patient Re-evaluated prior to inductionOxygen Delivery Method: Circle system utilized Preoxygenation: Pre-oxygenation with 100% oxygen Intubation Type: IV induction Ventilation: Mask ventilation without difficulty Laryngoscope Size: Mac and 3 Grade View: Grade I Tube type: Oral Tube size: 7.0 mm Number of attempts: 1 Airway Equipment and Method: Stylet and Oral airway Placement Confirmation: ETT inserted through vocal cords under direct vision,  positive ETCO2 and breath sounds checked- equal and bilateral Secured at: 21 cm Tube secured with: Tape Dental Injury: Teeth and Oropharynx as per pre-operative assessment

## 2016-01-02 NOTE — Anesthesia Procedure Notes (Signed)
Anesthesia Regional Block:  Adductor canal block  Pre-Anesthetic Checklist: ,, timeout performed, Correct Patient, Correct Site, Correct Laterality, Correct Procedure, Correct Position, site marked, Risks and benefits discussed,  Surgical consent,  Pre-op evaluation,  At surgeon's request and post-op pain management  Laterality: Right  Prep: chloraprep       Needles:  Injection technique: Single-shot  Needle Type: Stimiplex     Needle Length: 9cm 9 cm Needle Gauge: 21 and 21 G    Additional Needles:  Procedures: ultrasound guided (picture in chart) Adductor canal block Narrative:  Start time: 01/02/2016 11:50 AM End time: 01/02/2016 11:55 AM Injection made incrementally with aspirations every 5 mL.  Performed by: Personally  Anesthesiologist: Nolon Nations  Additional Notes: BP cuff, EKG monitors applied. Sedation begun. Artery and nerve location verified with U/S and anesthetic injected incrementally, slowly, and after negative aspirations under direct u/s guidance. Good fascial /perineural spread. Tolerated well.

## 2016-01-02 NOTE — Transfer of Care (Signed)
Immediate Anesthesia Transfer of Care Note  Patient: Sarah Cobb  Procedure(s) Performed: Procedure(s) with comments: RIGHT KNEE PATELLA-FEMORAL ARTHROPLASTY (Right) - Requests 2.5 hrs  Patient Location: PACU  Anesthesia Type:General  Level of Consciousness: awake, alert , oriented and patient cooperative  Airway & Oxygen Therapy: Patient Spontanous Breathing and Patient connected to face mask oxygen  Post-op Assessment: Report given to RN, Post -op Vital signs reviewed and stable and Patient moving all extremities X 4  Post vital signs: stable  Last Vitals:  Vitals:   01/02/16 1158 01/02/16 1445  BP:  117/79  Pulse: 82 (!) 114  Resp: 16 12  Temp:  (P) 37 C    Last Pain:  Vitals:   01/02/16 1027  TempSrc: Oral      Patients Stated Pain Goal: 3 (03/88/82 8003)  Complications: No apparent anesthesia complications

## 2016-01-02 NOTE — Discharge Instructions (Signed)
Elevate leg above heart 6x a day for 40mnutes each Use knee immobilizer while walking until can SLR x 10 Use knee immobilizer in bed to keep knee in extension Aquacel dressing may remain in place until follow up. May shower with aquacel dressing in place. If the dressing becomes saturated or peels off, you may remove aquacel dressing. Do not remove steri-strips if they are present. Place new dressing with gauze and tape or ACE bandage which should be kept clean and dry and changed daily.  INSTRUCTIONS AFTER JOINT REPLACEMENT   o Remove items at home which could result in a fall. This includes throw rugs or furniture in walking pathways o ICE to the affected joint every three hours while awake for 30 minutes at a time, for at least the first 3-5 days, and then as needed for pain and swelling.  Continue to use ice for pain and swelling. You may notice swelling that will progress down to the foot and ankle.  This is normal after surgery.  Elevate your leg when you are not up walking on it.   o Continue to use the breathing machine you got in the hospital (incentive spirometer) which will help keep your temperature down.  It is common for your temperature to cycle up and down following surgery, especially at night when you are not up moving around and exerting yourself.  The breathing machine keeps your lungs expanded and your temperature down.   DIET:  As you were doing prior to hospitalization, we recommend a well-balanced diet.  DRESSING / WOUND CARE / SHOWERING  Keep the surgical dressing until follow up.  The dressing is water proof, so you can shower without any extra covering.  IF THE DRESSING FALLS OFF or the wound gets wet inside, change the dressing with sterile gauze.  Please use good hand washing techniques before changing the dressing.  Do not use any lotions or creams on the incision until instructed by your surgeon.    ACTIVITY  o Increase activity slowly as tolerated, but follow the  weight bearing instructions below.   o No driving for 6 weeks or until further direction given by your physician.  You cannot drive while taking narcotics.  o No lifting or carrying greater than 10 lbs. until further directed by your surgeon. o Avoid periods of inactivity such as sitting longer than an hour when not asleep. This helps prevent blood clots.  o You may return to work once you are authorized by your doctor.     WEIGHT BEARING   Weight bearing as tolerated with assist device (walker, cane, etc) as directed, use it as long as suggested by your surgeon or therapist, typically at least 4-6 weeks.   EXERCISES  Results after joint replacement surgery are often greatly improved when you follow the exercise, range of motion and muscle strengthening exercises prescribed by your doctor. Safety measures are also important to protect the joint from further injury. Any time any of these exercises cause you to have increased pain or swelling, decrease what you are doing until you are comfortable again and then slowly increase them. If you have problems or questions, call your caregiver or physical therapist for advice.   Rehabilitation is important following a joint replacement. After just a few days of immobilization, the muscles of the leg can become weakened and shrink (atrophy).  These exercises are designed to build up the tone and strength of the thigh and leg muscles and to improve motion. Often  times heat used for twenty to thirty minutes before working out will loosen up your tissues and help with improving the range of motion but do not use heat for the first two weeks following surgery (sometimes heat can increase post-operative swelling).   These exercises can be done on a training (exercise) mat, on the floor, on a table or on a bed. Use whatever works the best and is most comfortable for you.    Use music or television while you are exercising so that the exercises are a pleasant  break in your day. This will make your life better with the exercises acting as a break in your routine that you can look forward to.   Perform all exercises about fifteen times, three times per day or as directed.  You should exercise both the operative leg and the other leg as well.  Exercises include:    Quad Sets - Tighten up the muscle on the front of the thigh (Quad) and hold for 5-10 seconds.    Straight Leg Raises - With your knee straight (if you were given a brace, keep it on), lift the leg to 60 degrees, hold for 3 seconds, and slowly lower the leg.  Perform this exercise against resistance later as your leg gets stronger.   Leg Slides: Lying on your back, slowly slide your foot toward your buttocks, bending your knee up off the floor (only go as far as is comfortable). Then slowly slide your foot back down until your leg is flat on the floor again.   Angel Wings: Lying on your back spread your legs to the side as far apart as you can without causing discomfort.   Hamstring Strength:  Lying on your back, push your heel against the floor with your leg straight by tightening up the muscles of your buttocks.  Repeat, but this time bend your knee to a comfortable angle, and push your heel against the floor.  You may put a pillow under the heel to make it more comfortable if necessary.   A rehabilitation program following joint replacement surgery can speed recovery and prevent re-injury in the future due to weakened muscles. Contact your doctor or a physical therapist for more information on knee rehabilitation.    CONSTIPATION  Constipation is defined medically as fewer than three stools per week and severe constipation as less than one stool per week.  Even if you have a regular bowel pattern at home, your normal regimen is likely to be disrupted due to multiple reasons following surgery.  Combination of anesthesia, postoperative narcotics, change in appetite and fluid intake all can  affect your bowels.   YOU MUST use at least one of the following options; they are listed in order of increasing strength to get the job done.  They are all available over the counter, and you may need to use some, POSSIBLY even all of these options:    Drink plenty of fluids (prune juice may be helpful) and high fiber foods Colace 100 mg by mouth twice a day  Senokot for constipation as directed and as needed Dulcolax (bisacodyl), take with full glass of water  Miralax (polyethylene glycol) once or twice a day as needed.  If you have tried all these things and are unable to have a bowel movement in the first 3-4 days after surgery call either your surgeon or your primary doctor.    If you experience loose stools or diarrhea, hold the medications until you stool  forms back up.  If your symptoms do not get better within 1 week or if they get worse, check with your doctor.  If you experience "the worst abdominal pain ever" or develop nausea or vomiting, please contact the office immediately for further recommendations for treatment.   ITCHING:  If you experience itching with your medications, try taking only a single pain pill, or even half a pain pill at a time.  You can also use Benadryl over the counter for itching or also to help with sleep.   TED HOSE STOCKINGS:  Use stockings on both legs until for at least 2 weeks or as directed by physician office. They may be removed at night for sleeping.  MEDICATIONS:  See your medication summary on the After Visit Summary that nursing will review with you.  You may have some home medications which will be placed on hold until you complete the course of blood thinner medication.  It is important for you to complete the blood thinner medication as prescribed.  PRECAUTIONS:  If you experience chest pain or shortness of breath - call 911 immediately for transfer to the hospital emergency department.   If you develop a fever greater that 101 F, purulent  drainage from wound, increased redness or drainage from wound, foul odor from the wound/dressing, or calf pain - CONTACT YOUR SURGEON.                                                   FOLLOW-UP APPOINTMENTS:  If you do not already have a post-op appointment, please call the office for an appointment to be seen by your surgeon.  Guidelines for how soon to be seen are listed in your After Visit Summary, but are typically between 1-4 weeks after surgery.  OTHER INSTRUCTIONS:   Knee Replacement:  Do not place pillow under knee, focus on keeping the knee straight while resting. CPM instructions: 0-90 degrees, 2 hours in the morning, 2 hours in the afternoon, and 2 hours in the evening. Place foam block, curve side up under heel at all times except when in CPM or when walking.  DO NOT modify, tear, cut, or change the foam block in any way.  MAKE SURE YOU:   Understand these instructions.   Get help right away if you are not doing well or get worse.    Thank you for letting us be a part of your medical care team.  It is a privilege we respect greatly.  We hope these instructions will help you stay on track for a fast and full recovery!

## 2016-01-02 NOTE — H&P (View-Only) (Signed)
Sarah Cobb is an 45 y.o. female.   Chief Complaint: Right knee pain HPI: The patient is a 45 year old female who presents today for follow up of their back. The patient is being followed for their back pain. They are now 10 day(s) out from flare up. Symptoms reported today include: pain. The patient states that they are doing 90 percent better. Current treatment includes: physical therapy and Cyclobenzaprine. The patient reports their current pain level to be mild. The patient has reported improvement of their symptoms with: corticosteroid use.  Sarah Cobb follows up today. She is here for recheck of her back now 10 days out from that flare up, it is doing about 90% better, still noting some stiffness in the back and buttock area as well as ongoing in her right knee. However, the pain is not radicular in nature at this point like it was last time. She finished the prednisone. She is now just taking Flexeril al as needed. She has not been able to get back to work yet as she needs a form filled out by [place] today to release her to go back to work. She did two therapy visits here for her back. She still seeing Eddie Dibbles for her knees and he is aware of the issues with her back as well. Apparently, he has sent [place] a new progress note that we have not received yet. She was told by rheumatology last time she saw them with her negative rheumatology panel, there was nothing else for them to really offer her. She is also following up today for her right knee status post Euflexxa series. States she got about a week of relief out of those. She has had short term relief only with cortisone in the past as well. The knee is bothering her on a daily basis interfering with activities of daily living at this point and with her ability to walk. She did have x-rays with rheumatology over the summer. However, they were poor quality and we do not have them scanned into her chart. She is unsure of her bone density status.  Past Medical  History:  Diagnosis Date  . Anxiety   . Arrhythmia    Mitral valve regurgitation per pt- OV Dr Chancy Milroy, cardio 6/13- states had eccho and EKG  . Depression   . Family history of adverse reaction to anesthesia    don't know, she was adopted  . GERD (gastroesophageal reflux disease)   . History of hiatal hernia   . History of kidney stones   . Knee pain, right    arthrofibrosis rt knee  . Rosacea     Past Surgical History:  Procedure Laterality Date  . CHOLECYSTECTOMY N/A 01/02/2015   Procedure: LAPAROSCOPIC CHOLECYSTECTOMY WITH INTRAOPERATIVE CHOLANGIOGRAM;  Surgeon: Stark Klein, MD;  Location: [facility];  Service: General;  Laterality: N/A;  . COLONOSCOPY  03/15/2011   NORMAL  . CRYOTHERAPY     CERVIX  . FRACTURE SURGERY     2013  . KNEE ARTHROSCOPY  02/03/2012   Procedure: ARTHROSCOPY KNEE;  Surgeon: Johnn Hai, MD;  Location: [facility];  Service: Orthopedics;  Laterality: Right;  Right Knee Arthroscopy with Manipulation Under Anesthesia/Evaluation Under Anesthesia/Debridement  . ORIF PATELLA  07/22/2011   Procedure: OPEN REDUCTION INTERNAL (ORIF) FIXATION PATELLA;  Surgeon: Johnn Hai, MD;  Location: [facility];  Service: Orthopedics;  Laterality: Right;  . WISDOM TOOTH EXTRACTION      Family History  Problem Relation Age of Onset  .  Adopted: Yes   Social History:  reports that she quit smoking about 17 years ago. Her smoking use included Cigarettes. She quit after 15.00 years of use. She has never used smokeless tobacco. She reports that she does not drink alcohol or use drugs.  Allergies:  Allergies  Allergen Reactions  . Diazepam Palpitations    Tachycardia  (intolerance)     (Not in a hospital admission)  No results found for this or any previous visit (from the past 48 hour(s)). No results found.  Review of Systems  Constitutional: Negative.   HENT: Negative.   Eyes: Negative.   Respiratory: Negative.   Cardiovascular: Negative.   Gastrointestinal: Negative.    Genitourinary: Negative.   Musculoskeletal: Positive for joint pain.  Skin: Negative.   Neurological: Negative.     There were no vitals taken for this visit. Physical Exam  Constitutional: She is oriented to person, place, and time. She appears well-developed.  HENT:  Head: Normocephalic.  Eyes: Pupils are equal, round, and reactive to light.  Neck: Normal range of motion.  Cardiovascular: Normal rate.   Respiratory: Effort normal.  GI: Soft.  Musculoskeletal:  On exam, well-nourished, well-developed, awake, alert and oriented x3, in no acute distress. Slightly antalgic gait. On examination of lumbar spine, nontender of the spinous processes, paraspinus musculature, buttock and hip. Negative straight leg raise bilaterally. No lower extremity weakness. A 5/5 hip flexors, quads, hamstrings, plantar flexion, dorsiflexion, EHL. As far as examination of the right knee, positive patellofemoral pain and positive patellofemoral crepitus. She does have some mild medial and lateral joint space tenderness as well, but minimal range of motion approximately 0 to 115 degrees. No pain or laxity with varus or valgus stress. No calf pain or sign of DVT.  Neurological: She is alert and oriented to person, place, and time.    X-rays of the knee ordered, obtained and reviewed today, AP, lateral, merchant. 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 The patella does seem to be tracking properly. It is midline, more narrowing over the lateral aspect of the patellofemoral joint. Medial and lateral joint spaces do have some mild degenerative changes without collapse. Left knee great joint spaces. No significant degenerative changes.  Assessment/Plan 1. 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 2. Right knee posttraumatic DJD, patellofemoral compartment refractory to  conservative treatment including most recently viscosupplementation.  We discussed relevant anatomy, etiology of her pain. Discussed the importance of activity modifications, both back and need to avoid exacerbation. For the back, continue with PT and home exercise program and she would like to combine her PT and do not all with Eddie Dibbles that is fine. Otherwise, she can continue with hair or home exercise program. Continue disc pressure management, core motion, core strengthening. She can see how she does with the back. She seems to be out of the woods on this and he has already had an MRI. If this pain flares back up in her back, we could repeat a dosepak or we discuss possible need for an epidural steroid injection. We will hold off on that for now, given her improvement. As far as the knee we have discussed options. Really given her extensive patellofemoral wear and arthroscopic debridement would likely not be beneficial. Viscosupplementation and cortisone have only provided temporary relief. The next step that we have discussed would be a patellofemoral arthroplasty, given her standing x-rays and no  significant medial and lateral joint space narrowing. I would not recommend a total knee replacement at this point. Dr. Tonita Cong will call the patient to discuss this further with her after conferring with some associates. Thus number to contact the patient is [phone]. We have also asked her to see what her bone density is that she contact her PCP for that. I have given her work note to return to work tomorrow full duty. I have also filled out her specific form from work for that. She will follow up as needed at this point for both the back and the knee. However, Dr. Tonita Cong will call her after the final decision on that right knee to discuss patellofemoral arthroplasty. The patient was seen in conjunction with Dr. Tonita Cong today.  Plan right knee patellofemoral arthroplasty  Cecilie Kicks., PA-C for Dr.  Tonita Cong 12/25/2015, 4:43 PM

## 2016-01-03 LAB — BASIC METABOLIC PANEL
Anion gap: 11 (ref 5–15)
BUN: 9 mg/dL (ref 6–20)
CALCIUM: 8.5 mg/dL — AB (ref 8.9–10.3)
CO2: 21 mmol/L — AB (ref 22–32)
Chloride: 105 mmol/L (ref 101–111)
Creatinine, Ser: 0.73 mg/dL (ref 0.44–1.00)
Glucose, Bld: 227 mg/dL — ABNORMAL HIGH (ref 65–99)
Potassium: 4.3 mmol/L (ref 3.5–5.1)
Sodium: 137 mmol/L (ref 135–145)

## 2016-01-03 LAB — CBC
HEMATOCRIT: 31.9 % — AB (ref 36.0–46.0)
Hemoglobin: 10.9 g/dL — ABNORMAL LOW (ref 12.0–15.0)
MCH: 30.5 pg (ref 26.0–34.0)
MCHC: 34.2 g/dL (ref 30.0–36.0)
MCV: 89.4 fL (ref 78.0–100.0)
PLATELETS: 314 10*3/uL (ref 150–400)
RBC: 3.57 MIL/uL — ABNORMAL LOW (ref 3.87–5.11)
RDW: 12.5 % (ref 11.5–15.5)
WBC: 18.5 10*3/uL — ABNORMAL HIGH (ref 4.0–10.5)

## 2016-01-03 NOTE — Evaluation (Signed)
Physical Therapy Evaluation Patient Details Name: Sarah Cobb MRN: [id number] DOB: 1970/12/24 Today's Date: 01/03/2016   History of Present Illness  s/p R UKR  Clinical Impression  Pt admitted with above diagnosis. Pt currently with functional limitations due to the deficits listed below (see PT Problem List).* Pt will benefit from skilled PT to increase their independence and safety with mobility to allow discharge to the venue listed below.  Pt is limitee by pain today, states her pain pills don't work and she is taking IV meds      Follow Up Recommendations Outpatient PT    Equipment Recommendations  None recommended by PT    Recommendations for Other Services       Precautions / Restrictions Precautions Precautions: Fall;Knee Required Braces or Orthoses: Knee Immobilizer - Right Restrictions Weight Bearing Restrictions: No Other Position/Activity Restrictions: pt able to do IND SLR x2 with severe pain, KI used for support/pain control/pt comfort      Mobility  Bed Mobility Overal bed mobility: Needs Assistance Bed Mobility: Supine to Sit     Supine to sit: Supervision     General bed mobility comments: for safety/lines, incr time  Transfers Overall transfer level: Needs assistance Equipment used: Rolling walker (2 wheeled) Transfers: Sit to/from Stand Sit to Stand: Min guard;Min assist         General transfer comment: cues for hand placement  Ambulation/Gait Ambulation/Gait assistance: Min guard;Min assist Ambulation Distance (Feet): 60 Feet Assistive device: Rolling walker (2 wheeled) Gait Pattern/deviations: Step-to pattern;Decreased stance time - right;Decreased weight shift to right     General Gait Details: cues for sequence, RW position  Stairs            Wheelchair Mobility    Modified Rankin (Stroke Patients Only)       Balance                                             Pertinent Vitals/Pain Pain  Assessment: 0-10 Pain Score: 5  Pain Location: right knee Pain Descriptors / Indicators: Sore Pain Intervention(s): Limited activity within patient's tolerance;Monitored during session;Premedicated before session    Home Living Family/patient expects to be discharged to:: Private residence Living Arrangements: Spouse/significant other Available Help at Discharge: Family Type of Home: House Home Access: Stairs to enter   Technical brewer of Steps: 1 Home Layout: Two level;Able to live on main level with bedroom/bathroom;Bed/bath upstairs Home Equipment: Walker - 2 wheels;Cane - quad      Prior Function Level of Independence: Independent with assistive device(s)               Hand Dominance        Extremity/Trunk Assessment   Upper Extremity Assessment Upper Extremity Assessment: Defer to OT evaluation;Overall WFL for tasks assessed    Lower Extremity Assessment Lower Extremity Assessment: RLE deficits/detail RLE Deficits / Details: knee extension and hip flexion 2+/5; knee flexion ~45* AAROM RLE: Unable to fully assess due to pain       Communication   Communication: No difficulties  Cognition Arousal/Alertness: Awake/alert Behavior During Therapy: WFL for tasks assessed/performed Overall Cognitive Status: Within Functional Limits for tasks assessed                      General Comments      Exercises Total Joint Exercises Ankle Circles/Pumps: AROM;10 reps;Both Quad Sets:  5 reps;AROM;Both Heel Slides: AAROM;Right;15 reps Hip ABduction/ADduction: AAROM;AROM;10 reps;Right   Assessment/Plan    PT Assessment Patient needs continued PT services  PT Problem List Decreased strength;Decreased range of motion;Decreased activity tolerance;Decreased mobility;Decreased knowledge of use of DME;Decreased knowledge of precautions;Pain          PT Treatment Interventions DME instruction;Gait training;Functional mobility training;Therapeutic  activities;Therapeutic exercise;Stair training;Patient/family education    PT Goals (Current goals can be found in the Care Plan section)  Acute Rehab PT Goals Patient Stated Goal: home, less pain PT Goal Formulation: With patient Time For Goal Achievement: 01/10/16 Potential to Achieve Goals: Good    Frequency 7X/week   Barriers to discharge        Co-evaluation               End of Session Equipment Utilized During Treatment: Gait belt Activity Tolerance: Patient tolerated treatment well Patient left: in chair;with call bell/phone within reach;with chair alarm set Nurse Communication: Mobility status         Time: 4128-7867 PT Time Calculation (min) (ACUTE ONLY): 20 min   Charges:   PT Evaluation $PT Eval Low Complexity: 1 Procedure     PT G Codes:        Lenni Reckner 16-Jan-2016, 12:48 PM

## 2016-01-03 NOTE — Progress Notes (Signed)
   Subjective: 1 Day Post-Op Procedure(s) (LRB): RIGHT KNEE PATELLA-FEMORAL ARTHROPLASTY (Right)  C/o moderate pain to right knee and with movement CPM to continue later today Probable d/c tomorrow depending on pain and therapy Patient reports pain as moderate.  Objective:   VITALS:   Vitals:   01/03/16 0139 01/03/16 0554  BP: (!) 91/58 (!) 94/58  Pulse: 92 93  Resp: 14 14  Temp: 98.3 F (36.8 C) 98 F (36.7 C)    Right knee dressing intact nv intact distally No rashes or edema  LABS  Recent Labs  01/03/16 0446  HGB 10.9*  HCT 31.9*  WBC 18.5*  PLT 314     Recent Labs  01/03/16 0446  NA 137  K 4.3  BUN 9  CREATININE 0.73  GLUCOSE 227*     Assessment/Plan: 1 Day Post-Op Procedure(s) (LRB): RIGHT KNEE PATELLA-FEMORAL ARTHROPLASTY (Right) PT/OT Pain management Pulmonary toilet Probable d/c tomorrow    Merla Riches, MPAS, PA-C  01/03/2016, 7:48 AM

## 2016-01-03 NOTE — Op Note (Signed)
Sarah Cobb, Sarah Cobb              ACCOUNT NO.:  [id number]  MEDICAL RECORD NO.:  [id number]  LOCATION:  [facility]                         FACILITY:  [facility]  PHYSICIAN:  Susa Day, M.D.    DATE OF BIRTH:  1970/12/06  DATE OF PROCEDURE:  01/02/2016 DATE OF DISCHARGE:                              OPERATIVE REPORT   PREOPERATIVE DIAGNOSIS:  Posttraumatic patellofemoral arthrosis, right knee.  POSTOPERATIVE DIAGNOSIS:  Posttraumatic patellofemoral arthrosis, right knee.  PROCEDURE PERFORMED:  Right knee partial joint replacement, patellofemoral arthroplasty.  ANESTHESIA:  General.  ASSISTANT:  Cleophas Dunker, PA and Rod Can, MD.  HISTORY:  A 45 year old, status post patellar fracture with posttraumatic arthrosis refractory to conservative treatment, severe negative affect in her activities of daily living, was indicated for partial replacement of the degenerated joint.  The medial and lateral compartments were unremarkable.  She was indicated for replacement. Risk and benefits discussed including bleeding, infection, damage to neurovascular structures, no change in symptoms, worsening symptoms, DVT, PE, anesthetic complications, etc.  TECHNIQUE:  With the patient in supine position, after induction of adequate general anesthesia, 2 g of Kefzol, the right lower extremity was prepped, draped and exsanguinated in usual sterile fashion, thigh tourniquet was inflated to 275 mmHg.  She was placed in the [facility] leg holder.  Midline incision was made over previous incision.  Subcutaneous tissue was dissected.  Electrocautery was utilized to achieve hemostasis.  We developed full-thickness flaps medially and laterally. Median parapatellar arthrotomy was then performed and then, we did the mid vastus approach.  We took care not to damage the articular cartilage, the meniscus or the meniscotibial attachment.  Patella was everted.  Knee was flexed.  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  The weightbearing surfaces were normal.  We everted the patella.  We used an oscillating saw.  There was no cartilage on the patella and resected 10 mm of the patella from the medial facet.  The remaining thickness was 15.  We then sized it to a 35, drilling our peg holes, medializing those.  We then turned our attention back towards the femur at 0.78 mm above the attachment of the PCL and intercondylar notch after drawing Whitesides line, a drill hole and irrigated the femoral canal and placed an intramedullary guide with a distal femoral attachment.  We used the Zimmer prosthesis.  An external alignment guide off the lateral ridge was used with appropriate external rotation, this was pinned.  We used our guide.  Then, we performed our anterior cut without notching the femur.  We used multiple trials, the trial to a 3 to sit flush on the anterior cut.  We pinned it provisionally.  Sized it to a 3, used our Milling guide placed in on that.  We used our high-speed bur Milling guide and milled the recesses to accept the implant.  We then removed this, again trialed the implant at a 3, medially and laterally.  We drilled our lug holes and was found to be satisfactory.  We reduced it and placed a trial patella, we had excellent patellofemoral tracking.  We removed the patella and the trial femur.  We then flexed the  knee and mixed the cement on back table in appropriate fashion and then applied it to our 3 femoral component and cemented in place and packed in it.  We applied it to our 3 patella, cemented it and applied our patellar clamp.  Redundant cement removed. After curing of the cement, we removed all redundant cement and reduced the patella, had excellent patellofemoral tracking, this was without difficulty.  No crepitus was noted.  No impingement was noted. Previously, we had to remove remaining osteophytes from the patella, copiously irrigated the  wound, pulsatile lavage.  Injected the Marcaine and lidocaine with epinephrine into the joint.  Tourniquet was deflated at 66 minutes and there was adequate revascularization of lower extremity appreciated.  Any bleeding was cauterized.  We repaired the patellar retinaculum with 1 Vicryl and then a running Stratafix.  The VMO mid-vastus division was reapproximated in the fascia with 1 Vicryl. Subcu with 2-0 and skin with Monocryl.  Prior to that, we had full flexion, full extension and good stability of the patellofemoral joint with flexion and extension.  She was placed in immobilizer, extubated without difficulty and transported to the recovery room in satisfactory condition.  The patient tolerated the procedure well.  No complications.  Assistant, Cleophas Dunker, [place].  Minimal blood loss.     Susa Day, M.D.     Geralynn Rile  D:  01/02/2016  T:  01/03/2016  Job:  [id number]

## 2016-01-03 NOTE — Progress Notes (Signed)
Foley removed at [phone]

## 2016-01-03 NOTE — Progress Notes (Signed)
   01/03/16 1600  PT Visit Information  Last PT Received On 01/03/16  Assistance Needed +1  History of Present Illness s/p r UKR  Subjective Data  Patient Stated Goal home, less pain  Precautions  Precautions Fall;Knee  Precaution Comments KI not utilized, IND SLR and pt with hx of knee immobility/scar tissue after previous surgery; reviewed knee precautions  Restrictions  Weight Bearing Restrictions No  Pain Assessment  Pain Assessment 0-10  Pain Score 6  Pain Location R knee/thigh  Pain Descriptors / Indicators Aching;Sore  Pain Intervention(s) Limited activity within patient's tolerance;Monitored during session;Repositioned;Ice applied  Cognition  Arousal/Alertness Awake/alert  Behavior During Therapy WFL for tasks assessed/performed  Overall Cognitive Status Within Functional Limits for tasks assessed  Bed Mobility  Overal bed mobility Needs Assistance  Bed Mobility Supine to Sit;Sit to Supine  Supine to sit Supervision  Sit to supine Supervision  General bed mobility comments incr time, supervision for safety  Transfers  Overall transfer level Needs assistance  Equipment used Rolling walker (2 wheeled)  Transfers Sit to/from Stand  Sit to Stand Min guard;Supervision  General transfer comment cues for hand placmenent and RLE position  Ambulation/Gait  Ambulation/Gait assistance Min guard;Supervision  Ambulation Distance (Feet) 65 Feet  Assistive device Rolling walker (2 wheeled)  Gait Pattern/deviations Step-to pattern;Decreased stance time - right;Decreased weight shift to right  General Gait Details cues for sequence, RW position  Gait velocity interpretation Below normal speed for age/gender  Total Joint Exercises  Quad Sets AROM;Strengthening;Both;10 reps  Heel Slides AAROM;Right;10 reps  Straight Leg Raises AROM;AAROM;Strengthening;Right;10 reps  PT - End of Session  Equipment Utilized During Treatment Gait belt  Activity Tolerance Patient tolerated treatment  well  Patient left in bed;with call bell/phone within reach;with bed alarm set;with family/visitor present  Nurse Communication Mobility status  PT - Assessment/Plan  PT Plan Current plan remains appropriate  PT Frequency (ACUTE ONLY) 7X/week  Follow Up Recommendations Outpatient PT;Home health PT  PT equipment None recommended by PT  PT Goal Progression  Progress towards PT goals Progressing toward goals  Acute Rehab PT Goals  PT Goal Formulation With patient  Time For Goal Achievement 01/10/16  Potential to Achieve Goals Good  PT Time Calculation  PT Start Time (ACUTE ONLY) 1400  PT Stop Time (ACUTE ONLY) 1424  PT Time Calculation (min) (ACUTE ONLY) 24 min  PT General Charges  $$ ACUTE PT VISIT 1 Procedure  PT Treatments  $Gait Training 8-22 mins  $Therapeutic Exercise 8-22 mins

## 2016-01-03 NOTE — Progress Notes (Signed)
Occupational Therapy Evaluation Patient Details Name: Sarah Cobb MRN: [id number] DOB: [birth year] Today's Date: 01/03/2016    History of Present Illness s/p r UKR   Clinical Impression   Patient presents to OT with decreased ADL independence and safety due to the deficits listed below. She will benefit from skilled OT to maximize function and to facilitate a safe discharge. OT will follow.    Follow Up Recommendations  No OT follow up;Supervision - Intermittent    Equipment Recommendations  Other (comment) (pt possibly would like a shower chair --will decide)    Recommendations for Other Services       Precautions / Restrictions Precautions Precautions: Fall;Knee Required Braces or Orthoses: Knee Immobilizer - Right Restrictions Weight Bearing Restrictions: No Other Position/Activity Restrictions: pt able to do IND SLR x2 with severe pain, KI used for support/pain control/pt comfort      Mobility Bed Mobility            General bed mobility comments: NT -- pt up in recliner  Transfers             General transfer comment: NT -- pt wanted to wait until tomorrow    Balance                                            ADL Overall ADL's : Needs assistance/impaired Eating/Feeding: Independent;Sitting   Grooming: Set up;Sitting   Upper Body Bathing: Set up;Sitting   Lower Body Bathing: Minimal assistance;Sit to/from stand   Upper Body Dressing : Set up;Sitting   Lower Body Dressing: Minimal assistance;Moderate assistance;Sit to/from Health and safety inspector Details (indicate cue type and reason): reports she toileted earlier via [facility] with nursing staff           General ADL Comments: Patient received up in recliner. Denied need to toilet during session, but reports she has been using BSC with nursing without difficulty. Patient focused on shower transfer and need for shower chair. She had a shower chair but gave it away.  Educated pt on tub/shower transfer technique. She wanted to wait and practice tomorrow, at which time we can determine if she needs a shower chair. Patient reports her husband will assist her with LB self-care.     Vision     Perception     Praxis      Pertinent Vitals/Pain Pain Assessment: 0-10 Pain Score: 6  Pain Location: R knee/thigh, back Pain Descriptors / Indicators: Aching;Sore Pain Intervention(s): Limited activity within patient's tolerance;Monitored during session     Hand Dominance     Extremity/Trunk Assessment Upper Extremity Assessment Upper Extremity Assessment: Overall WFL for tasks assessed   Lower Extremity Assessment Lower Extremity Assessment: Defer to PT evaluation        Communication Communication Communication: No difficulties   Cognition Arousal/Alertness: Awake/alert Behavior During Therapy: WFL for tasks assessed/performed Overall Cognitive Status: Within Functional Limits for tasks assessed                     General Comments       Exercises       Shoulder Instructions      Home Living Family/patient expects to be discharged to:: Private residence Living Arrangements: Spouse/significant other Available Help at Discharge: Family Type of Home: House Home Access: Stairs to enter [employer] of Steps: 1   Home  Layout: Two level;Able to live on main level with bedroom/bathroom;Bed/bath upstairs     Bathroom Shower/Tub: Teacher, [employer]hn[employer] years/pre: Standard Bathroom Accessibility: Yes How Accessible: Accessible via walker Home Equipment: [place] - 2 wheels;Cane - quad   Additional Comments: had a shower chair but gave it away      Prior Functioning/Environment Level of Independence: Independent with assistive device(s)                 OT Problem List: Decreased strength;Decreased range of motion;Decreased activity tolerance;Decreased knowledge of use of DME or AE;Pain   OT  Treatment/Interventions: Self-care/ADL training;DME and/or AE instruction;Therapeutic activities;Patient/family education    OT Goals(Current goals can be found in the care plan section) Acute Rehab OT Goals Patient Stated Goal: home, less pain OT Goal Formulation: With patient Time For Goal Achievement: 01/17/16 Potential to Achieve Goals: Good ADL Goals Pt Will Perform Lower Body Dressing: with min assist;sit to/from stand Pt Will Perform Tub/Shower Transfer: Tub transfer;with min assist;rolling walker;shower seat  OT Frequency: Min 2X/week   Barriers to D/C:            Co-evaluation              End of Session    Activity Tolerance: Patient limited by pain Patient left: in chair;with call bell/phone within reach;with chair alarm set   Time: 1217-1228 OT Time Calculation (min): 11 min Charges:  OT General Charges $OT Visit: 1 Procedure OT Evaluation $OT Eval Low Complexity: 1 Procedure G-Codes:    Hadessah Grennan A 01-30-2016, 1:53 PM

## 2016-01-04 ENCOUNTER — Inpatient Hospital Stay (HOSPITAL_COMMUNITY): Payer: BC Managed Care – PPO

## 2016-01-04 DIAGNOSIS — M79609 Pain in unspecified limb: Secondary | ICD-10-CM

## 2016-01-04 DIAGNOSIS — M7989 Other specified soft tissue disorders: Secondary | ICD-10-CM

## 2016-01-04 LAB — CBC
HEMATOCRIT: 31.6 % — AB (ref 36.0–46.0)
HEMOGLOBIN: 10.7 g/dL — AB (ref 12.0–15.0)
MCH: 31.1 pg (ref 26.0–34.0)
MCHC: 33.9 g/dL (ref 30.0–36.0)
MCV: 91.9 fL (ref 78.0–100.0)
PLATELETS: 279 10*3/uL (ref 150–400)
RBC: 3.44 MIL/uL — AB (ref 3.87–5.11)
RDW: 13.2 % (ref 11.5–15.5)
WBC: 13.8 10*3/uL — AB (ref 4.0–10.5)

## 2016-01-04 MED ORDER — HYDROMORPHONE HCL 2 MG PO TABS
2.0000 mg | ORAL_TABLET | ORAL | Status: DC | PRN
Start: 2016-01-04 — End: 2016-01-07
  Administered 2016-01-04 – 2016-01-06 (×9): 2 mg via ORAL
  Filled 2016-01-04 (×10): qty 1

## 2016-01-04 NOTE — Progress Notes (Signed)
PT Cancellation Note  Patient Details Name: Sarah Cobb MRN: [id number] DOB: 1970-04-16   Cancelled Treatment:    Reason Eval/Treat Not Completed: Medical issues which prohibited therapy; defer PT until after doppler/results available   [facility] 01/04/2016, 10:16 AM

## 2016-01-04 NOTE — Progress Notes (Signed)
Pt w/c/o much pain and tenderness to rt thigh. Will notify MD when on unit. Elesia Pemberton, [employer]

## 2016-01-04 NOTE — Progress Notes (Signed)
   01/04/16 1300  PT Visit Information  Last PT Received On 01/04/16  Reason Eval/Treat Not Completed Medical issues which prohibited therapy (still awaiting doppler to r/o DVT)

## 2016-01-04 NOTE — Progress Notes (Signed)
Occupational Therapy Treatment Patient Details Name: Sarah Cobb MRN: [id number] DOB: 28-Mar-1970 Today's Date: 01/04/2016    History of present illness s/p r UKR   OT comments  Limited session today due to pain and Doppler pending to r/o DVT. Educated pt verbally on tub transfer technique; will practice next session. OT will follow.   Follow Up Recommendations  No OT follow up;Supervision - Intermittent    Equipment Recommendations   (shower chair (already delivered))    Recommendations for Other Services      Precautions / Restrictions Precautions Precautions: Fall;Knee Restrictions Weight Bearing Restrictions: No       Mobility Bed Mobility                  Transfers                      Balance                                   ADL Overall ADL's : Needs assistance/impaired                                       General ADL Comments: Very limited session due to patient's pain. Patient reports CPM caused excruciating pain. She had Dilaudid earlier this morning with some relief. She reports MD is doing Doppler of her leg to r/o DVT. Mobility deferred. Discussed tub transfer/need for shower chair. Patient reports she decided she wanted a shower chair and case manager arranged for shower chair delivery yesterday evening. Husband has already taken it home. Patient educated verbally on tub transfer technique but practice deferred due to pain and pending Doppler. Will follow up with patient tomorrow.      Vision                     Perception     Praxis      Cognition   Behavior During Therapy: WFL for tasks assessed/performed Overall Cognitive Status: Within Functional Limits for tasks assessed                       Extremity/Trunk Assessment               Exercises     Shoulder Instructions       General Comments      Pertinent Vitals/ Pain       Pain Assessment: 0-10 Pain Score:  9  Pain Location: R knee/thigh Pain Descriptors / Indicators: Constant;Sharp Pain Intervention(s): Limited activity within patient's tolerance;Premedicated before session  Home Living                                          Prior Functioning/Environment              Frequency  Min 2X/week        Progress Toward Goals  OT Goals(current goals can now be found in the care plan section)  Progress towards OT goals: Not progressing toward goals - comment (due to limited session today)  Acute Rehab OT Goals Patient Stated Goal: home, less pain  Plan Discharge plan remains appropriate    Co-evaluation  End of Session     Activity Tolerance Patient limited by pain   Patient Left in bed;with call bell/phone within reach;with bed alarm set   Nurse Communication          Time: [phone] OT Time Calculation (min): 8 min  Charges: OT General Charges $OT Visit: 1 Procedure OT Treatments $Self Care/Home Management : 8-22 mins  Anmarie Fukushima A 01/04/2016, 9:35 AM

## 2016-01-04 NOTE — Progress Notes (Signed)
Sarah Cobb  MRN: [id number] DOB/Age: [birth year] 46 y.o. Physician: Ander Slade, M.D. 2 Days Post-Op Procedure(s) (LRB): RIGHT KNEE PATELLA-FEMORAL ARTHROPLASTY (Right)  Subjective: Reports excruciating RLE pain after CPM use yesterday Vital Signs Temp:  [98.4 F (36.9 C)-99 F (37.2 C)] 98.7 F (37.1 C) (12/31 0639) Pulse Rate:  [85-118] 118 (12/31 0639) Resp:  [16-17] 16 (12/31 0639) BP: (111-126)/(67-80) 111/67 (12/31 0639) SpO2:  [98 %-100 %] 99 % (12/31 0639)  Lab Results  Recent Labs  01/03/16 0446 01/04/16 0505  WBC 18.5* 13.8*  HGB 10.9* 10.7*  HCT 31.9* 31.6*  PLT 314 279   BMET  Recent Labs  01/03/16 0446  NA 137  K 4.3  CL 105  CO2 21*  GLUCOSE 227*  BUN 9  CREATININE 0.73  CALCIUM 8.5*   INR  Date Value Ref Range Status  12/26/2015 0.91  Final     Exam  A and O, c/o RLE pain, thigh and calf soft, poor quad tone, no palpable cords, fair ankle ROM. Dressing with dried blood within distal aspect, no drainage. Plan Doppler to r/o DVT Hold CPM Switch to dilaudid po Myan [facility] M Kristin Lamagna 01/04/2016, 9:22 AM    Contact # [phone]

## 2016-01-04 NOTE — Care Management Note (Signed)
Case Management Note  Patient Details  Name: Rishita Petron MRN: [id number] Date of Birth: Feb 03, 1970  Subjective/Objective:   Right Knee Patella -Femoral Arthroplasty                  Action/Plan: Discharge Planning: NCM spoke to pt and states she has [place] PT with [facility]. Has RW at home. Will have NCM faxed dc summary and orders to once available to [facility] on 01/05/2016.    Expected Discharge Date:                 Expected Discharge Plan:  [place]  In-House Referral:  NA  Discharge planning Services  CM Consult  Post Acute Care Choice:  Home Health Choice offered to:  Patient  DME Arranged:  N/A DME Agency:  NA  HH Arranged:  PT [place] Agency:  Other - See comment  Status of Service:  In process, will continue to follow  If discussed at Long Length of Stay Meetings, dates discussed:    Additional Comments:  Erenest Rasher, RN 01/04/2016, 5:58 PM

## 2016-01-04 NOTE — Progress Notes (Signed)
VASCULAR LAB PRELIMINARY  PRELIMINARY  PRELIMINARY  PRELIMINARY  *Right lower extremity venous duplex completed.    Preliminary report:  Right:  No evidence of DVT, superficial thrombosis, or Baker's cyst.  Valari Taylor, RVS 01/04/2016, 5:38 PM

## 2016-01-05 ENCOUNTER — Inpatient Hospital Stay (HOSPITAL_COMMUNITY): Payer: BC Managed Care – PPO

## 2016-01-05 DIAGNOSIS — L0390 Cellulitis, unspecified: Secondary | ICD-10-CM | POA: Diagnosis present

## 2016-01-05 DIAGNOSIS — L03115 Cellulitis of right lower limb: Secondary | ICD-10-CM

## 2016-01-05 LAB — URINALYSIS, COMPLETE (UACMP) WITH MICROSCOPIC
Bacteria, UA: NONE SEEN
Bilirubin Urine: NEGATIVE
GLUCOSE, UA: NEGATIVE mg/dL
Ketones, ur: NEGATIVE mg/dL
LEUKOCYTES UA: NEGATIVE
NITRITE: NEGATIVE
PROTEIN: NEGATIVE mg/dL
SPECIFIC GRAVITY, URINE: 1.018 (ref 1.005–1.030)
pH: 5 (ref 5.0–8.0)

## 2016-01-05 LAB — CBC
HCT: 33.5 % — ABNORMAL LOW (ref 36.0–46.0)
HEMOGLOBIN: 11.3 g/dL — AB (ref 12.0–15.0)
MCH: 31.2 pg (ref 26.0–34.0)
MCHC: 33.7 g/dL (ref 30.0–36.0)
MCV: 92.5 fL (ref 78.0–100.0)
PLATELETS: 308 10*3/uL (ref 150–400)
RBC: 3.62 MIL/uL — AB (ref 3.87–5.11)
RDW: 13.1 % (ref 11.5–15.5)
WBC: 16.7 10*3/uL — AB (ref 4.0–10.5)

## 2016-01-05 LAB — LACTIC ACID, PLASMA
Lactic Acid, Venous: 2.9 mmol/L (ref 0.5–1.9)
Lactic Acid, Venous: 1.6 mmol/L (ref 0.5–1.9)

## 2016-01-05 MED ORDER — SODIUM CHLORIDE 0.9 % IV BOLUS (SEPSIS)
1000.0000 mL | Freq: Once | INTRAVENOUS | Status: AC
  Administered 2016-01-05: 1000 mL via INTRAVENOUS

## 2016-01-05 MED ORDER — HYDROMORPHONE HCL 2 MG PO TABS: 2.0000 mg | ORAL_TABLET | ORAL | 0 refills | Status: AC | PRN

## 2016-01-05 MED ORDER — CEPHALEXIN 500 MG PO CAPS: 1000.0000 mg | ORAL_CAPSULE | Freq: Two times a day (BID) | ORAL | Status: DC

## 2016-01-05 MED ORDER — CEFAZOLIN IN D5W 1 GM/50ML IV SOLN
1.0000 g | Freq: Three times a day (TID) | INTRAVENOUS | Status: DC
  Administered 2016-01-05 – 2016-01-06 (×3): 1 g via INTRAVENOUS
  Filled 2016-01-05 (×3): qty 50

## 2016-01-05 MED ORDER — SODIUM CHLORIDE 0.9 % IV SOLN
INTRAVENOUS | Status: DC
  Administered 2016-01-05: 11:00:00 via INTRAVENOUS

## 2016-01-05 NOTE — Progress Notes (Signed)
PT Cancellation Note  Patient Details Name: Sarah Cobb MRN: [id number] DOB: 03-18-1970   Cancelled Treatment:    Reason Eval/Treat Not Completed: Fatigue/lethargy limiting ability to participate; pt refuses d/t pain, fatigue and states she has an "infection"; will attempt again as schedule permits; pt reports she has been up to bathroom without assist (without difficulty)   [facility] 01/05/2016, 12:03 PM

## 2016-01-05 NOTE — Progress Notes (Signed)
CSW consulted for SNF placement. PN reviewed. PT is recommending HHPT at d/c. RNCM will assist with d/c planning needs.  Werner Lean LCSW [phone]

## 2016-01-05 NOTE — Consult Note (Signed)
[facility] H&P    Patient Demographics:    Sarah Cobb, is a 46 y.o. female  MRN: [id number]  DOB - [birth year]  Admit Date - 01/02/2016  Referring MD/PA- Darla Lesches Mayo  Outpatient Primary MD for the patient is SPENCER,SARA C, PA-C    Right leg erythema/swelling    HPI:    Sarah Cobb  is a 46 y.o. female, Who underwent right knee patellofemoral arthroplasty, and developed redness and swelling around right knee extending up to thigh, patient also became tachycardic with elevated lactate 2.9, leukocytosis with WBC 18.5. Hospital medicine consulted for possible cellulitis.  Patient says that the redness developed after she started physical therapy. She denies dysuria, denies shortness of breath. Has been coughing before coming to the hospital. Chest x-ray showed no active disease. UA negative for any abnormality Patient empirically started on IV cefazolin    Review of systems:    In addition to the HPI above,  No Fever-chills, No Headache, No changes with Vision or hearing, No problems swallowing food or Liquids, No Chest pain, Cough or Shortness of Breath, No Abdominal pain, No Nausea or Vomiting, bowel movements are regular, No Blood in stool or Urine, No dysuria, No new skin rashes or bruises, No new weakness, tingling, numbness in any extremity,   A full 10 point Review of Systems was done, except as stated above, all other Review of Systems were negative.   With Past History of the following :    Past Medical History:  Diagnosis Date  . Anxiety   . Arrhythmia    Mitral valve regurgitation per pt- OV Dr Chancy Milroy, cardio 6/13- states had eccho and EKG  . Depression   . Family history of adverse reaction to anesthesia    don't know, she was adopted  . Fibromyalgia   . GERD (gastroesophageal reflux disease)   . Headache   . History of hiatal hernia   . History of kidney stones   .  Knee pain, right    arthrofibrosis rt knee  . Mitral valve regurgitation   . Pneumonia   . Rosacea       Past Surgical History:  Procedure Laterality Date  . CHOLECYSTECTOMY N/A 01/02/2015   Procedure: LAPAROSCOPIC CHOLECYSTECTOMY WITH INTRAOPERATIVE CHOLANGIOGRAM;  Surgeon: Stark Klein, MD;  Location: [place];  Service: General;  Laterality: N/A;  . COLONOSCOPY  03/15/2011   NORMAL  . CRYOTHERAPY     CERVIX  . FRACTURE SURGERY     2013  . KNEE ARTHROSCOPY  02/03/2012   Procedure: ARTHROSCOPY KNEE;  Surgeon: Johnn Hai, MD;  Location: [facility];  Service: Orthopedics;  Laterality: Right;  Right Knee Arthroscopy with Manipulation Under Anesthesia/Evaluation Under Anesthesia/Debridement  . ORIF PATELLA  07/22/2011   Procedure: OPEN REDUCTION INTERNAL (ORIF) FIXATION PATELLA;  Surgeon: Johnn Hai, MD;  Location: [facility];  Service: Orthopedics;  Laterality: Right;  . UPPER GI ENDOSCOPY  2016  . WISDOM TOOTH EXTRACTION        Social History:      Social History  Substance  Use Topics  . Smoking status: Former Smoker    Years: 15.00    Types: Cigarettes    Quit date: 07/21/1998  . Smokeless tobacco: Never Used  . Alcohol use 0.0 oz/week     Comment: socially       Family History :     Family History  Problem Relation Age of Onset  . Adopted: Yes      Home Medications:   Prior to Admission medications   Medication Sig Start Date End Date Taking? Authorizing Provider  ALPRAZolam (XANAX) 0.25 MG tablet Take 0.25 mg by mouth 3 (three) times daily as needed for anxiety.  05/30/13  Yes Historical Provider, MD  cetirizine (ZYRTEC) 10 MG tablet Take 10 mg by mouth as needed for allergies.   Yes Historical Provider, MD  dexlansoprazole (DEXILANT) 60 MG capsule Take 60 mg by mouth every morning.   Yes Historical Provider, MD  flintstones complete (FLINTSTONES) 60 MG chewable tablet Chew 1 tablet by mouth daily.   Yes Historical Provider, MD  LO LOESTRIN FE 1 MG-10 MCG / 10  MCG tablet Take 1 tablet by mouth every morning.  05/25/13  Yes Historical Provider, MD  LYRICA 50 MG capsule Take 50 mg by mouth 2 (two) times daily. 12/19/15  Yes Historical Provider, MD  oxyCODONE (OXY IR/ROXICODONE) 5 MG immediate release tablet Take 5 mg by mouth every 12 (twelve) hours as needed for pain. for pain 11/19/15  Yes Historical Provider, MD  ranitidine (ZANTAC) 150 MG tablet Take 150 mg by mouth 2 (two) times daily as needed. For acid reflux/indigestion 11/25/15  Yes Historical Provider, MD  SUMAtriptan (IMITREX) 50 MG tablet Take 50 mg by mouth daily as needed for migraine. 12/06/15  Yes Historical Provider, MD  traMADol (ULTRAM) 50 MG tablet Take 50 mg by mouth every 12 (twelve) hours as needed.  12/19/15  Yes Historical Provider, MD  aspirin EC 325 MG tablet Take 1 tablet (325 mg total) by mouth 2 (two) times daily. 01/02/16   Susa Day, MD  cyclobenzaprine (FLEXERIL) 10 MG tablet Take 1 tablet (10 mg total) by mouth 3 (three) times daily as needed for muscle spasms. 01/02/16   Cecilie Kicks, PA-C  docusate sodium (COLACE) 100 MG capsule Take 1 capsule (100 mg total) by mouth 2 (two) times daily as needed for mild constipation. 01/02/16   Susa Day, MD  HYDROcodone-acetaminophen (NORCO/VICODIN) 5-325 MG tablet Take 1 tablet by mouth every 6 (six) hours as needed. Patient not taking: Reported on 12/23/2015 10/03/15   Blanchie Dessert, MD  HYDROmorphone (DILAUDID) 2 MG tablet Take 1 tablet (2 mg total) by mouth every 3 (three) hours as needed for severe pain. 01/05/16 01/10/16  Carmen Christina Mayo, PA-C  Lysine 1000 MG TABS Take 1,000 mg by mouth daily as needed (cold sore/fever blisters).     Historical Provider, MD  ondansetron (ZOFRAN) 4 MG tablet Take 1 tablet (4 mg total) by mouth every 8 (eight) hours as needed for nausea or vomiting. 06/19/14   Lafayette Dragon, MD  oxyCODONE-acetaminophen (PERCOCET) 5-325 MG tablet Take 1-2 tablets by mouth every 4 (four) hours as needed for  severe pain. 01/02/16   Susa Day, MD  polyethylene glycol [facility] / GLYCOLAX) packet Take 17 g by mouth daily. 01/02/16   Susa Day, MD     Allergies:     Allergies  Allergen Reactions  . Diazepam Palpitations    Tachycardia  (intolerance)     Physical Exam:   Vitals  Blood pressure 103/72, pulse (!) 123, temperature 97.8 F (36.6 C), temperature source Oral, resp. rate 18, height 5' (1.524 m), weight 73.5 kg (162 lb), last menstrual period 12/25/2013, SpO2 100 %.  1.  General: Appears in no acute distress  2. Psychiatric:  Intact judgement and  insight, awake alert, oriented x 3.  3. Neurologic: No focal neurological deficits, all cranial nerves intact.Strength 5/5 all 4 extremities, sensation intact all 4 extremities, plantars down going.  4. Eyes :  anicteric sclerae, moist conjunctivae with no lid lag. PERRLA.  5. ENMT:  Oropharynx clear with moist mucous membranes and good dentition  6. Neck:  supple, no cervical lymphadenopathy appriciated, No thyromegaly  7. Respiratory : Normal respiratory effort, good air movement bilaterally,clear to  auscultation bilaterally  8. Cardiovascular : RRR, no gallops, rubs or murmurs, no leg edema  9. Gastrointestinal:  Positive bowel sounds, abdomen soft, non-tender to palpation,no hepatosplenomegaly, no rigidity or guarding       10. Skin:  No cyanosis, normal texture and turgor, no rash, lesions or ulcers  11.Musculoskeletal:  Positive erythema, warmth, and tenderness noted around the right knee joint. Redness extends up the medial aspect of the right thigh, mild induration, tenderness to palpation.    Data Review:    CBC  Recent Labs Lab 01/03/16 0446 01/04/16 0505 01/05/16 0417  WBC 18.5* 13.8* 16.7*  HGB 10.9* 10.7* 11.3*  HCT 31.9* 31.6* 33.5*  PLT 314 279 308  MCV 89.4 91.9 92.5  MCH 30.5 31.1 31.2  MCHC 34.2 33.9 33.7  RDW 12.5 13.2 13.1    ------------------------------------------------------------------------------------------------------------------  Chemistries   Recent Labs Lab 01/03/16 0446  NA 137  K 4.3  CL 105  CO2 21*  GLUCOSE 227*  BUN 9  CREATININE 0.73  CALCIUM 8.5*   ------------------------------------------------------------------------------------------------------------------  ------------------------------------------------------------------------------------------------------------------ GFR: Estimated Creatinine Clearance: 78.7 mL/min (by C-G formula based on SCr of 0.73 mg/dL). Liver Function Tests: No results for input(s): AST, ALT, ALKPHOS, BILITOT, PROT, ALBUMIN in the last 168 hours. No results for input(s): LIPASE, AMYLASE in the last 168 hours. No results for input(s): AMMONIA in the last 168 hours. Coagulation Profile: No results for input(s): INR, PROTIME in the last 168 hours. Cardiac Enzymes: No results for input(s): CKTOTAL, CKMB, CKMBINDEX, TROPONINI in the last 168 hours. BNP (last 3 results) No results for input(s): PROBNP in the last 8760 hours. HbA1C: No results for input(s): HGBA1C in the last 72 hours. CBG: No results for input(s): GLUCAP in the last 168 hours. Lipid Profile: No results for input(s): CHOL, HDL, LDLCALC, TRIG, CHOLHDL, LDLDIRECT in the last 72 hours. Thyroid Function Tests: No results for input(s): TSH, T4TOTAL, FREET4, T3FREE, THYROIDAB in the last 72 hours. Anemia Panel: No results for input(s): VITAMINB12, FOLATE, FERRITIN, TIBC, IRON, RETICCTPCT in the last 72 hours.  --------------------------------------------------------------------------------------------------------------- Urine analysis:    Component Value Date/Time   COLORURINE YELLOW 12/26/2015 0805   APPEARANCEUR CLEAR 12/26/2015 0805   LABSPEC 1.013 12/26/2015 0805   PHURINE 5.0 12/26/2015 0805   GLUCOSEU NEGATIVE 12/26/2015 0805   HGBUR SMALL (A) 12/26/2015 0805   BILIRUBINUR  NEGATIVE 12/26/2015 0805   BILIRUBINUR neg 11/23/2013 1735   KETONESUR NEGATIVE 12/26/2015 0805   PROTEINUR NEGATIVE 12/26/2015 0805   UROBILINOGEN 0.2 11/23/2013 1735   UROBILINOGEN 0.2 12/15/2011 1703   NITRITE NEGATIVE 12/26/2015 0805   LEUKOCYTESUR NEGATIVE 12/26/2015 0805      Imaging Results:    Dg Chest 1 View  Result Date: 01/05/2016 CLINICAL DATA:  Patient with  right lower extremity tenderness. EXAM: CHEST 1 VIEW COMPARISON:  Chest radiograph 10/02/2015. FINDINGS: The heart size and mediastinal contours are within normal limits. Both lungs are clear. The visualized skeletal structures are unremarkable. IMPRESSION: No active disease. Electronically Signed   By: Lovey Newcomer M.D.   On: 01/05/2016 11:38       Assessment & Plan:    Principal Problem:   Patellofemoral arthritis of right knee Active Problems:   Right knee DJD   1. Right leg cellulitis- patient started on IV cefazolin per orthopedics, will continue with this antibiotic. Follow blood culture results. 2. Status post patellofemoral arthroplasty- followed by orthopedics   Will check CBC in a.m.  Will follow the patient with you   Time spent in minutes : 60 min   Chelsa Stout S M.D on 01/05/2016 at 4:37 PM  Between 7am to 7pm - Pager - [phone]. After 7pm go to [email or web address] - password [facility]  [facility] - Office  [phone]

## 2016-01-05 NOTE — Progress Notes (Signed)
    Subjective: 3 Days Post-Op Procedure(s) (LRB): RIGHT KNEE PATELLA-FEMORAL ARTHROPLASTY (Right) Patient reports pain as 6 on 0-10 scale.   Denies CP or SOB.  Voiding without difficulty. Positive flatus. Objective: Vital signs in last 24 hours: Temp:  [99.2 F (37.3 C)-100.3 F (37.9 C)] 99.6 F (37.6 C) (01/01 0519) Pulse Rate:  [114-120] 114 (01/01 0519) Resp:  [16-20] 20 (01/01 0519) BP: (109-127)/(72-78) 109/72 (01/01 0519) SpO2:  [97 %-100 %] 97 % (01/01 0519)  Intake/Output from previous day: 12/31 0701 - 01/01 0700 In: 960 [P.O.:960] Out: 375 [Urine:375] Intake/Output this shift: No intake/output data recorded.  Labs:  Recent Labs  01/03/16 0446 01/04/16 0505 01/05/16 0417  HGB 10.9* 10.7* 11.3*    Recent Labs  01/04/16 0505 01/05/16 0417  WBC 13.8* 16.7*  RBC 3.44* 3.62*  HCT 31.6* 33.5*  PLT 279 308    Recent Labs  01/03/16 0446  NA 137  K 4.3  CL 105  CO2 21*  BUN 9  CREATININE 0.73  GLUCOSE 227*  CALCIUM 8.5*   No results for input(s): LABPT, INR in the last 72 hours.  Physical Exam: Neurologically intact ABD soft Neurovascular intact Sensation intact distally Dorsiflexion/Plantar flexion intact Incision: scant drainage Compartment soft  Assessment/Plan: 3 Days Post-Op Procedure(s) (LRB): RIGHT KNEE PATELLA-FEMORAL ARTHROPLASTY (Right) Up with therapy  Consider DC home with home health after cleared by PT  Garcia Dalzell, Darla Lesches for Dr. Melina Schools [facility] [phone] 01/05/2016, 8:19 AM    Patient ID: Sarah Cobb, female   DOB: 1970/11/11, 46 y.o.   MRN: [id number]

## 2016-01-05 NOTE — Progress Notes (Signed)
Occupational Therapy Treatment Patient Details Name: Sarah Cobb MRN: [id number] DOB: [birth year] Today's Date: 01/05/2016    History of present illness s/p r UKR   OT comments  Pt limited by 5/10 pain in R knee with activity. Educated on safety with functional transfers and recommend 3in1 for increased safety. Discussed at length tub/shower transfer but unable to practice due to pain level today. Nursing in during session and aware of warm, bruised area of R inner thigh. Will continue to follow and practice tub transfer as able.   Follow Up Recommendations  No OT follow up;Supervision/Assistance - 24 hour;Supervision - Intermittent    Equipment Recommendations  3 in 1 bedside commode    Recommendations for Other Services      Precautions / Restrictions Precautions Precautions: Fall;Knee Restrictions Weight Bearing Restrictions: No Other Position/Activity Restrictions: pt in the bathroom when OT arrived and states she got up on her own. Encouraged pt to call for assist. Unable to SLR today so encouraged pt to have KI applied when up again.       Mobility Bed Mobility               General bed mobility comments: in bathroom when OT arrived.  Transfers Overall transfer level: Needs assistance Equipment used: Rolling walker (2 wheeled) Transfers: Sit to/from Stand Sit to Stand: Min guard         General transfer comment: cues for hand placement and LE management.    Balance                                   ADL                           Toilet Transfer: Min guard;Ambulation;Comfort height toilet;RW;Grab bars;BSC             General ADL Comments: Pt in bathroom on commode when OT arrived. She states she did not call for assist. Encouraged pt to call for assist when getting up. She used grab bar to transfer off commode and states she only has a wall on the side of the commode at home. Discussed benefits of 3in1 and pt states she  would like to have one ordered. Pt not putting much weight down through R LE today to transfer with walker out of the bathroom due to pain. She was unable to perform SLR so explained to pt that she needs to have KI applied when up again today.  Educated on AE for LB self care and demonstrated all AE pieces. Discussed at length the shower chair and how to transfer on and off chair in the tub. She declined to practice currently due to R knee pain. Nursing in during session and states there is concern for infection of R knee area/      Vision                     Perception     Praxis      Cognition   Behavior During Therapy: [facility] for tasks assessed/performed Overall Cognitive Status: Within Functional Limits for tasks assessed                       Extremity/Trunk Assessment               Exercises     Shoulder Instructions  General Comments      Pertinent Vitals/ Pain       Pain Assessment: 0-10 Pain Score: 5  Pain Location: R knee/thigh Pain Descriptors / Indicators: Throbbing Pain Intervention(s): Ice applied;Monitored during session  Home Living                                          Prior Functioning/Environment              Frequency  Min 2X/week        Progress Toward Goals  OT Goals(current goals can now be found in the care plan section)  Progress towards OT goals: Not progressing toward goals - comment (pt limited by pain)     Plan Discharge plan remains appropriate    Co-evaluation                 End of Session Equipment Utilized During Treatment: Rolling walker   Activity Tolerance Patient limited by pain   Patient Left in chair;with call bell/phone within reach   Nurse Communication          Time: 1010-1045 OT Time Calculation (min): 35 min  Charges: OT General Charges $OT Visit: 1 Procedure OT Treatments $Self Care/Home Management : 8-22 mins $Therapeutic Activity: 8-22  mins  Jules Schick  [phone] 01/05/2016, 12:49 PM

## 2016-01-05 NOTE — Progress Notes (Signed)
Pt presents with red, taut skin. WBC trending up. Tachy. BP trending down. Saline bolus started and on call notified with new orders for lactic acid, saline bolus 1081m and Keflex 10058mpo BID. Will continue to monitor.

## 2016-01-05 NOTE — Progress Notes (Signed)
Patient unsure of ABG; RT allowing patient time to think about ABG draw. RT will attempt at a later time.

## 2016-01-05 NOTE — Progress Notes (Signed)
CRITICAL VALUE ALERT  Critical value received:  lactic acid 2.9  Date of notification:  01/05/15  Time of notification:  1173  Critical value read back:Yes.    Nurse who received alert:  Freddie Breech, RN  MD notified (1st page):  1135   Time of first page:  1135  MD notified (2nd page):  Time of second page:  Responding MD:  Ronette Deter, PA  Time MD responded:  [phone]

## 2016-01-05 NOTE — Progress Notes (Signed)
Physical Therapy Treatment Patient Details Name: Sarah Cobb MRN: [id number] DOB: August 27, 1970 Today's Date: 01/05/2016    History of Present Illness s/p r UKR    PT Comments    POD # 3 Increased pain level limits progress.  Assisted OOB to amb to bathroom.  Assisted inbathroom then amb in hallway.  Treatment session shortened by Lab Tech who need to draw blood.   Follow Up Recommendations  Outpatient PT;Home health PT     Equipment Recommendations  None recommended by PT    Recommendations for Other Services       Precautions / Restrictions Precautions Precautions: Fall;Knee    Mobility  Bed Mobility Overal bed mobility: Needs Assistance Bed Mobility: Supine to Sit;Sit to Supine     Supine to sit: Supervision Sit to supine: Min assist   General bed mobility comments: increased assist back to bed due to increased pain level after amb  Transfers Overall transfer level: Needs assistance Equipment used: Rolling walker (2 wheeled) Transfers: Sit to/from Stand Sit to Stand: Supervision;Min guard         General transfer comment: cues for hand placement and LE management.   assisted in/out bathroom as well  Ambulation/Gait Ambulation/Gait assistance: Min guard;Supervision Ambulation Distance (Feet): 42 Feet Assistive device: Rolling walker (2 wheeled) Gait Pattern/deviations: Step-to pattern;Decreased stance time - right;Decreased weight shift to right     General Gait Details: pt unable to tolerate FWB due to pain.  Limited distance due to pain.     Stairs            Wheelchair Mobility    Modified Rankin (Stroke Patients Only)       Balance                                    Cognition Arousal/Alertness: Awake/alert Behavior During Therapy: WFL for tasks assessed/performed Overall Cognitive Status: Within Functional Limits for tasks assessed                      Exercises      General Comments        Pertinent  Vitals/Pain Pain Assessment: 0-10 Pain Score: 7  Pain Location: R knee Pain Descriptors / Indicators: Operative site guarding;Discomfort;Grimacing Pain Intervention(s): Monitored during session;Repositioned    Home Living                      Prior Function            PT Goals (current goals can now be found in the care plan section)      Frequency    7X/week      PT Plan Current plan remains appropriate    Co-evaluation             End of Session Equipment Utilized During Treatment: Gait belt Activity Tolerance: Patient limited by pain Patient left: in bed;with call bell/phone within reach     Time: 1538-1555 PT Time Calculation (min) (ACUTE ONLY): 17 min  Charges:  $Gait Training: 8-22 mins                    G Codes:      Rica Koyanagi  PTA [facility]  Acute  Rehab Pager      [phone]

## 2016-01-06 ENCOUNTER — Inpatient Hospital Stay (HOSPITAL_COMMUNITY): Payer: BC Managed Care – PPO

## 2016-01-06 ENCOUNTER — Encounter (HOSPITAL_COMMUNITY): Payer: BC Managed Care – PPO

## 2016-01-06 ENCOUNTER — Encounter (HOSPITAL_COMMUNITY): Payer: Self-pay | Admitting: Specialist

## 2016-01-06 DIAGNOSIS — B999 Unspecified infectious disease: Secondary | ICD-10-CM

## 2016-01-06 LAB — CBC
HCT: 28.1 % — ABNORMAL LOW (ref 36.0–46.0)
HEMOGLOBIN: 9.5 g/dL — AB (ref 12.0–15.0)
MCH: 31.1 pg (ref 26.0–34.0)
MCHC: 33.8 g/dL (ref 30.0–36.0)
MCV: 92.1 fL (ref 78.0–100.0)
PLATELETS: 252 10*3/uL (ref 150–400)
RBC: 3.05 MIL/uL — AB (ref 3.87–5.11)
RDW: 13 % (ref 11.5–15.5)
WBC: 15 10*3/uL — ABNORMAL HIGH (ref 4.0–10.5)

## 2016-01-06 MED ORDER — GADOBENATE DIMEGLUMINE 529 MG/ML IV SOLN
15.0000 mL | Freq: Once | INTRAVENOUS | Status: AC | PRN
  Administered 2016-01-06: 15 mL via INTRAVENOUS

## 2016-01-06 MED ORDER — LORAZEPAM 2 MG/ML IJ SOLN
1.0000 mg | Freq: Once | INTRAMUSCULAR | Status: AC
  Administered 2016-01-06: 1 mg via INTRAVENOUS
  Filled 2016-01-06: qty 1

## 2016-01-06 MED ORDER — CLINDAMYCIN PHOSPHATE 600 MG/50ML IV SOLN
600.0000 mg | Freq: Three times a day (TID) | INTRAVENOUS | Status: DC
  Administered 2016-01-06 – 2016-01-07 (×2): 600 mg via INTRAVENOUS
  Filled 2016-01-06 (×3): qty 50

## 2016-01-06 MED ORDER — CEFAZOLIN IN D5W 1 GM/50ML IV SOLN
1.0000 g | Freq: Three times a day (TID) | INTRAVENOUS | Status: DC
  Administered 2016-01-06 – 2016-01-07 (×2): 1 g via INTRAVENOUS
  Filled 2016-01-06 (×3): qty 50

## 2016-01-06 MED ORDER — VANCOMYCIN HCL IN DEXTROSE 1-5 GM/200ML-% IV SOLN
1000.0000 mg | Freq: Once | INTRAVENOUS | Status: DC
  Filled 2016-01-06: qty 200

## 2016-01-06 MED ORDER — VANCOMYCIN HCL IN DEXTROSE 1-5 GM/200ML-% IV SOLN
1000.0000 mg | Freq: Two times a day (BID) | INTRAVENOUS | Status: DC
  Filled 2016-01-06: qty 200

## 2016-01-06 NOTE — Progress Notes (Signed)
Spoke with patient at bedside, obtained contact information for [facility]. Patient states they do both HH and OP PT. [place], spoke with Angie, she provided me with fax number to send orders and clinicals. Faxed clinicals and orders to [phone].

## 2016-01-06 NOTE — Progress Notes (Signed)
Physical Therapy Treatment Patient Details Name: Sarah Cobb MRN: [id number] DOB: March 05, 1970 Today's Date: 01/06/2016    History of Present Illness s/p r UKR    PT Comments    POD # 4  Assisted OOB to amb to bathroom.  Assisted in bathroom then amb back to bed.  Tx session limited awaiting to get MRI   R  knee.  Follow Up Recommendations  Outpatient PT;Home health PT     Equipment Recommendations  None recommended by PT    Recommendations for Other Services       Precautions / Restrictions Precautions Precautions: Fall;Knee Restrictions Weight Bearing Restrictions: No Other Position/Activity Restrictions: pt placing min weight on LLE    Mobility  Bed Mobility Overal bed mobility: Needs Assistance Bed Mobility: Supine to Sit;Sit to Supine     Supine to sit: Supervision Sit to supine: Min guard   General bed mobility comments: extra time.  MinGuard A for LLE getting back to bed  Transfers Overall transfer level: Needs assistance Equipment used: Rolling walker (2 wheeled) Transfers: Sit to/from [facility] Sit to Stand: Supervision Stand pivot transfers: Supervision       General transfer comment: cues for hand placement and LE management.   assisted in/out bathroom as well  Ambulation/Gait Ambulation/Gait assistance: Min guard;Supervision Ambulation Distance (Feet): 20 Feet (to and from bathroom only ) Assistive device: Rolling walker (2 wheeled) Gait Pattern/deviations: Step-to pattern     General Gait Details: pt unable to tolerate much WB R LE due to pain.  Limited distance due to waiting to get an MRI knee.     Stairs            Wheelchair Mobility    Modified Rankin (Stroke Patients Only)       Balance                                    Cognition Arousal/Alertness: Awake/alert Behavior During Therapy: WFL for tasks assessed/performed Overall Cognitive Status: Within Functional Limits for tasks  assessed                      Exercises      General Comments        Pertinent Vitals/Pain Pain Assessment: 0-10 Pain Score: 5  Pain Location: R knee Pain Descriptors / Indicators: Tender;Discomfort;Grimacing Pain Intervention(s): Monitored during session;Repositioned    Home Living                      Prior Function            PT Goals (current goals can now be found in the care plan section) Progress towards PT goals: Progressing toward goals    Frequency    7X/week      PT Plan Current plan remains appropriate    Co-evaluation             End of Session Equipment Utilized During Treatment: Gait belt Activity Tolerance: Patient limited by pain Patient left: in bed;with call bell/phone within reach     Time: 1316-1330 PT Time Calculation (min) (ACUTE ONLY): 14 min  Charges:  $Gait Training: 8-22 mins                    G Codes:      Rica Koyanagi  PTA [facility]  Acute  Rehab Pager      [phone]

## 2016-01-06 NOTE — Progress Notes (Signed)
Pharmacy Antibiotic Note  Mercie Balsley is a 46 y.o. female admitted on 01/02/2016 with cellulitis.  Pharmacy has been consulted for vancomycin dosing. Patient initially started on cefazolin but changing to vancomycin for worsening erythema.  Orthopedics mentions this may be in reactive erythema due to hematoma.  On 12/30 patient underwent R knee patella-femoral arthoplasty.   Today, 01/06/2016:  Renal: SCr WNL  WBC remains elevated with some improvement overnight  afebrile  [facility] notes say checking MRI to r/o necrotizing fasciitis  Plan:  Vancomycin 1gm IV q12h  Monitor renal function  Follow-up ability to narrow as appropriate  Height: 5' (152.4 cm) Weight: 162 lb (73.5 kg) IBW/kg (Calculated) : 45.5  Temp (24hrs), Avg:98.5 F (36.9 C), Min:97.8 F (36.6 C), Max:99 F (37.2 C)   Recent Labs Lab 01/03/16 0446 01/04/16 0505 01/05/16 0417 01/05/16 1050 01/05/16 1312 01/06/16 0437  WBC 18.5* 13.8* 16.7*  --   --  15.0*  CREATININE 0.73  --   --   --   --   --   LATICACIDVEN  --   --   --  2.9* 1.6  --     Estimated Creatinine Clearance: 78.7 mL/min (by C-G formula based on SCr of 0.73 mg/dL).    Allergies  Allergen Reactions  . Diazepam Palpitations    Tachycardia  (intolerance)    Antimicrobials this admission: 1/1 >> cefazolin >> 1/2 1/2 >> vanco >>  Dose adjustments this admission:  Microbiology results: 1/1 BCx: pending  Thank you for allowing pharmacy to be a part of this patient's care.  Doreene Eland, PharmD, BCPS.   Pager: [phone] 01/06/2016 1:06 PM

## 2016-01-06 NOTE — Progress Notes (Signed)
ANTICOAGULATION CONSULT NOTE - Initial Consult  PHARMACY CONSULT: Lovenox for VTE prophylaxis   26 yoF admitted on 12/29 with right leg cellulitis, s/p R knee patella-femoral arthoplasty on 12/30.  MRI of the right thigh muscle shows edema which may reflect an infectious or inflammatory myositis.  Venous Doppler ordered to rule out DVT as the patient is on hormonal oral contraceptive therapy.  Pharmacy is consulted to dose Lovenox for DVT prophylaxis   Wt: 73.5 kg BMI:  31.6 Scr:  0.73, CrC ~ 78 ml/min CBC: Hgb 9.5, Plt 252  Labs:  Recent Labs  01/04/16 0505 01/05/16 0417 01/06/16 0437  HGB 10.7* 11.3* 9.5*  HCT 31.6* 33.5* 28.1*  PLT 279 308 252     Plan:   Lovenox 70m SQ daily  Monitor CBC and renal function, adjust as needed  Dosage remains stable and need for further dosage adjustment appears unlikely at present.  Pharmacy will sign off at this time.  Please reconsult if a change in clinical status warrants re-evaluation of dosage.    CGretta ArabPharmD, BCPS Pager [phone]/02/2016 5:02 PM

## 2016-01-06 NOTE — Progress Notes (Signed)
Subjective: 4 Days Post-Op Procedure(s) (LRB): RIGHT KNEE PATELLA-FEMORAL ARTHROPLASTY (Right) Patient reports pain as 4 on 0-10 scale.  Pain increased after CPM on Sat.   Objective: Vital signs in last 24 hours: Temp:  [97.8 F (36.6 C)-99 F (37.2 C)] 98 F (36.7 C) (01/02 0511) Pulse Rate:  [95-123] 97 (01/02 0511) Resp:  [16-18] 16 (01/02 0511) BP: (98-103)/(60-72) 98/61 (01/02 0511) SpO2:  [97 %-100 %] 100 % (01/02 0511)  Intake/Output from previous day: 01/01 0701 - 01/02 0700 In: 1456.3 [P.O.:720; I.V.:736.3] Out: 900 [Urine:900] Intake/Output this shift: No intake/output data recorded.   Recent Labs  01/04/16 0505 01/05/16 0417 01/06/16 0437  HGB 10.7* 11.3* 9.5*    Recent Labs  01/05/16 0417 01/06/16 0437  WBC 16.7* 15.0*  RBC 3.62* 3.05*  HCT 33.5* 28.1*  PLT 308 252   No results for input(s): NA, K, CL, CO2, BUN, CREATININE, GLUCOSE, CALCIUM in the last 72 hours. No results for input(s): LABPT, INR in the last 72 hours.  ABD soft Neurovascular intact Sensation intact distally Dorsiflexion/Plantar flexion intact Incision: dressing C/D/I  Removed dressing Skin and wound no sign of infection. No drainage. Are of involvement proximal medial over ecchymosis. Area where midvastus is.   Assessment/Plan: 4 Days Post-Op Procedure(s) (LRB): RIGHT KNEE PATELLA-FEMORAL ARTHROPLASTY (Right) Up with therapy  Check xray since increase pain since Sat cpm. Area of erythema is over ecchymosis medial thigh where midvastus  Approach was. May just be reactive erythema from hematoma. Elevate PT for ambulation. Will check again at noon.  Ruben Pyka C 01/06/2016, 7:23 AM

## 2016-01-06 NOTE — Progress Notes (Addendum)
[facility] PROGRESS NOTE  Lanyah Spengler LKJ:[id number] DOB: 11-01-1970 DOA: 01/02/2016   PCP: Aura Dials, PA-C     Assessment/Plan: Principal Problem:   Patellofemoral arthritis of right knee Active Problems:   Right knee DJD   Cellulitis   Infection   Sarah Cobb  is a 46 y.o. female, Who underwent right knee patellofemoral arthroplasty 12/30, and developed redness and swelling around right knee extending up to thigh, patient also became tachycardic with elevated lactate 2.9, leukocytosis with WBC 18.5. Hospital medicine consulted for possible cellulitis.Chest x-ray showed no active disease. UA negative for any abnormality Patient empirically started on IV cefazolin  Assessment and plan Right leg cellulitis-preop surgical screen for MRSA was negative. Overnight the redness has progressed towards her groin area. Change antibiotics from IV cefazolin to IV vancomycin plus clinda to cover MRSA and anaerobes , MRI of the right thigh Muscle edema in the right vastus intermedius muscle and to lesser extent vastus medial and lateralis muscles which may reflect an infectious or inflammatory myositis  . Follow results of blood culture. Will also order venous Doppler to rule out DVT as the patient is on hormonal therapy . Place on lovenox for DVT prophylaxis   Status post patellofemoral arthroplasty- followed by orthopedics. Orthopedics contacted to request DVT prophylaxis recommendations  Anemia Slight drop in hg , doubt hematoma/ bleeding  in the thigh  Follow hg closely    DVT prophylaxsis as per orthopedics, SCDs  Code Status:  Full code    Family Communication: Discussed in detail with the patient, all imaging results, lab results explained to the patient   Disposition Plan:  2-3 days      Consultants:  [facility]  Procedures:  None  Antibiotics: Anti-infectives    Start     Dose/Rate Route Frequency Ordered Stop   01/05/16 1600  ceFAZolin (ANCEF) IVPB  1 g/50 mL premix  Status:  Discontinued     1 g 100 mL/hr over 30 Minutes Intravenous Every 8 hours 01/05/16 1542 01/06/16 0954   01/05/16 1200  cephALEXin (KEFLEX) capsule 1,000 mg  Status:  Discontinued     1,000 mg Oral Every 12 hours 01/05/16 1028 01/05/16 1054   01/02/16 2000  ceFAZolin (ANCEF) IVPB 2g/100 mL premix     2 g 200 mL/hr over 30 Minutes Intravenous Every 6 hours 01/02/16 1714 01/03/16 0833   01/02/16 1254  polymyxin B 500,000 Units, bacitracin 50,000 Units in sodium chloride irrigation 0.9 % 500 mL irrigation  Status:  Discontinued       As needed 01/02/16 1254 01/02/16 1442   01/02/16 1015  ceFAZolin (ANCEF) IVPB 2g/100 mL premix     2 g 200 mL/hr over 30 Minutes Intravenous On call to O.R. 01/02/16 1008 01/02/16 1158         HPI/Subjective: Patient is anxious and tearful about the amount of pain that she is experiencing in her right thigh  Objective: Vitals:   01/05/16 2017 01/05/16 2226 01/06/16 0206 01/06/16 0511  BP: 102/62  98/60 98/61  Pulse: (!) 110 95 (!) 110 97  Resp: 18  18 16   Temp: 99 F (37.2 C) 99 F (37.2 C) 98.5 F (36.9 C) 98 F (36.7 C)  TempSrc: Axillary Oral Oral Oral  SpO2: 100%  97% 100%  Weight:      Height:        Intake/Output Summary (Last 24 hours) at 01/06/16 1225 Last data filed at 01/06/16 0936  Gross per 24  hour  Intake          1816.25 ml  Output             1300 ml  Net           516.25 ml    Exam:  Examination:  General exam: Appears calm and comfortable  Respiratory system: Clear to auscultation. Respiratory effort normal. Cardiovascular system: S1 & S2 heard, RRR. No JVD, murmurs, rubs, gallops or clicks. No pedal edema. Gastrointestinal system: Abdomen is nondistended, soft and nontender. No organomegaly or masses felt. Normal bowel sounds heard. Central nervous system: Alert and oriented. No focal neurological deficits. Positive erythema, warmth, and tenderness noted around the right knee joint. Redness  extends up the medial aspect of the right thigh, mild induration, tenderness to palpation Skin: No rashes, lesions or ulcers Psychiatry: Judgement and insight appear normal. Mood & affect appropriate.     Data Reviewed: I have personally reviewed following labs and imaging studies  Micro Results No results found for this or any previous visit (from the past 240 hour(s)).  Radiology Reports Dg Chest 1 View  Result Date: 01/05/2016 CLINICAL DATA:  Patient with right lower extremity tenderness. EXAM: CHEST 1 VIEW COMPARISON:  Chest radiograph 10/02/2015. FINDINGS: The heart size and mediastinal contours are within normal limits. Both lungs are clear. The visualized skeletal structures are unremarkable. IMPRESSION: No active disease. Electronically Signed   By: Lovey Newcomer M.D.   On: 01/05/2016 11:38   Dg Bone Density (dxa)  Result Date: 12/30/2015 EXAM: DUAL X-RAY ABSORPTIOMETRY (DXA) FOR BONE MINERAL DENSITY IMPRESSION: Referring Physician: Domingo Pulse SPENCER PATIENT: Name: Sarah Cobb Patient ID: [id number] Birth Date: 03-Dec-1970 Height: 60.0 in. Sex: Female Measured: 12/30/2015 Weight: 162.0 lbs. Indications: Caucasian, Dexilant, Low Calcium Intake (269.3), Lryica, Premenopausal Fractures: Patella Treatments: Birth Control, Multivitamin ASSESSMENT: The BMD measured at Femur Total Right is 0.685 0.729 g/cm2 with a Z-score of -2.2. The Z-score is below the expected range for age. L-1 was excluded due to degenerative changes. ISCD recommends using Z-scores for assessment of pre-menopausal women, men between 74 and 67 yoa, and children under 15 yoa. The diagnosis of osteoporosis in these patients should not be made on the basis of densitometric criteria alone. Osteoporosis may be diagnosed if there is low BMD with secondary causes, such as glucocorticoid therapy, hyperparathyroidism or hypogonadism. Site      Region      Measured Date Measured Age YA      AM      BMD T-score Z-score DualFemur Total  Right 12/30/2015 46.3 -2.6 -2.2 0.685 g/cm2 AP Spine L2-L4 12/30/2015 46.3 -1.0 -0.9 1.086 g/cm2 RECOMMENDATION: All patients should ensure an adequate intake of dietary calcium (1200 mg/d) and vitamin D (800 IU daily) unless contraindicated, and avoid modifiable risk factors. Additional treatment may be appropriate if there are other significant clinical risk factors. FOLLOW-UP: People with diagnosed cases of osteoporosis or osteopenia should be regularly tested for bone mineral density. For patients eligible for Medicare, routine testing is allowed once every two years. The testing frequency can be increased to one year for patients who have rapidly progressing disease, or for those who are receiving medical therapy to restore bone mass. I have reviewed this report, and agree with the above findings. [facility] Electronically Signed   By: Kerby Moors M.D.   On: 12/30/2015 16:41   Dg Knee Right Port  Result Date: 01/06/2016 CLINICAL DATA:  Postop right knee surgery EXAM: PORTABLE RIGHT KNEE - 1-2  VIEW COMPARISON:  01/02/2016 FINDINGS: The right knee demonstrates a patellofemoral arthroplasty without evidence of hardware failure complication. There is no significant joint effusion. There is no fracture or dislocation. The alignment is anatomic. Post-surgical changes noted in the surrounding soft tissues. IMPRESSION: Right patellofemoral arthroplasty without hardware failure or complication. Electronically Signed   By: Kathreen Devoid   On: 01/06/2016 08:59   Dg Knee Right Port  Result Date: 01/02/2016 CLINICAL DATA:  Right knee replacement EXAM: PORTABLE RIGHT KNEE - 1-2 VIEW COMPARISON:  None. FINDINGS: The right knee demonstrates a patellofemoral arthroplasty without evidence of hardware failure complication. There is no significant joint effusion. There is no fracture or dislocation. The alignment is anatomic. Post-surgical changes noted in the surrounding soft tissues. IMPRESSION: Interval  right patellofemoral arthroplasty. Electronically Signed   By: Kathreen Devoid   On: 01/02/2016 16:14     CBC  Recent Labs Lab 01/03/16 0446 01/04/16 0505 01/05/16 0417 01/06/16 0437  WBC 18.5* 13.8* 16.7* 15.0*  HGB 10.9* 10.7* 11.3* 9.5*  HCT 31.9* 31.6* 33.5* 28.1*  PLT 314 279 308 252  MCV 89.4 91.9 92.5 92.1  MCH 30.5 31.1 31.2 31.1  MCHC 34.2 33.9 33.7 33.8  RDW 12.5 13.2 13.1 13.0    Chemistries   Recent Labs Lab 01/03/16 0446  NA 137  K 4.3  CL 105  CO2 21*  GLUCOSE 227*  BUN 9  CREATININE 0.73  CALCIUM 8.5*   ------------------------------------------------------------------------------------------------------------------ estimated creatinine clearance is 78.7 mL/min (by C-G formula based on SCr of 0.73 mg/dL). ------------------------------------------------------------------------------------------------------------------ No results for input(s): HGBA1C in the last 72 hours. ------------------------------------------------------------------------------------------------------------------ No results for input(s): CHOL, HDL, LDLCALC, TRIG, CHOLHDL, LDLDIRECT in the last 72 hours. ------------------------------------------------------------------------------------------------------------------ No results for input(s): TSH, T4TOTAL, T3FREE, THYROIDAB in the last 72 hours.  Invalid input(s): FREET3 ------------------------------------------------------------------------------------------------------------------ No results for input(s): VITAMINB12, FOLATE, FERRITIN, TIBC, IRON, RETICCTPCT in the last 72 hours.  Coagulation profile No results for input(s): INR, PROTIME in the last 168 hours.  No results for input(s): DDIMER in the last 72 hours.  Cardiac Enzymes No results for input(s): CKMB, TROPONINI, MYOGLOBIN in the last 168 hours.  Invalid input(s):  CK ------------------------------------------------------------------------------------------------------------------ Invalid input(s): POCBNP   CBG: No results for input(s): GLUCAP in the last 168 hours.     Studies: Dg Chest 1 View  Result Date: 01/05/2016 CLINICAL DATA:  Patient with right lower extremity tenderness. EXAM: CHEST 1 VIEW COMPARISON:  Chest radiograph 10/02/2015. FINDINGS: The heart size and mediastinal contours are within normal limits. Both lungs are clear. The visualized skeletal structures are unremarkable. IMPRESSION: No active disease. Electronically Signed   By: Lovey Newcomer M.D.   On: 01/05/2016 11:38   Dg Knee Right Port  Result Date: 01/06/2016 CLINICAL DATA:  Postop right knee surgery EXAM: PORTABLE RIGHT KNEE - 1-2 VIEW COMPARISON:  01/02/2016 FINDINGS: The right knee demonstrates a patellofemoral arthroplasty without evidence of hardware failure complication. There is no significant joint effusion. There is no fracture or dislocation. The alignment is anatomic. Post-surgical changes noted in the surrounding soft tissues. IMPRESSION: Right patellofemoral arthroplasty without hardware failure or complication. Electronically Signed   By: Kathreen Devoid   On: 01/06/2016 08:59      No results found for: HGBA1C Lab Results  Component Value Date   CREATININE 0.73 01/03/2016       Scheduled Meds: . acidophilus  1 capsule Oral Daily  . aspirin EC  325 mg Oral BID PC  . docusate sodium  100 mg Oral BID  .  famotidine  20 mg Oral QHS  . loratadine  10 mg Oral Daily  . LORazepam  1 mg Intravenous Once  . Norethindrone-Ethinyl Estradiol-Fe Biphas  1 tablet Oral q morning - 10a  . pantoprazole  40 mg Oral Daily  . pregabalin  50 mg Oral BID   Continuous Infusions: . lactated ringers Stopped (01/03/16 1238)     LOS: 4 days    Time spent: >30 MINS    [facility]  [facility] Pager [phone]. If 7PM-7AM, please contact night-coverage at  [email or web address], password [facility] 01/06/2016, 12:25 PM  LOS: 4 days

## 2016-01-06 NOTE — Progress Notes (Signed)
Occupational Therapy Treatment Patient Details Name: Sarah Cobb MRN: [id number] DOB: [birth year] Today's Date: 01/06/2016    History of present illness s/p r UKR   OT comments  Pt took a shower in accessible shower stall; wants tub bench for home.  She is bearing min weight on LLE  Follow Up Recommendations  No OT follow up;Supervision/Assistance - 24 hour;Supervision - Intermittent    Equipment Recommendations  3 in 1 bedside commode;Tub/shower bench    Recommendations for Other Services      Precautions / Restrictions Precautions Precautions: Fall;Knee Required Braces or Orthoses: Knee Immobilizer - Right Restrictions Weight Bearing Restrictions: No Other Position/Activity Restrictions: pt placing min weight on LLE       Mobility Bed Mobility         Supine to sit: Supervision Sit to supine: Min assist   General bed mobility comments: extra time.  Min A for LLE getting back to bed  Transfers   Equipment used: Rolling walker (2 wheeled)   Sit to Stand: Supervision              Balance                                   ADL       Grooming: Oral care;Supervision/safety;Standing       Lower Body Bathing: Minimal assistance;Sit to/from stand           Toilet Transfer: Supervision/safety;Ambulation;BSC;Requires wide/bariatric   Toileting- Clothing Manipulation and Hygiene: Supervision/safety;Sit to/from stand         General ADL Comments: pt wanted to take a shower:  talked to Sarah Cobb and Sarah Cobb, newspaper (RNs to make sure it was OK).  Pt has a tub at home and she is placing minimal weight on LLE.  Talked to her about tub bench instead of chair, and she would like to exchange this.        Vision                     Perception     Praxis      Cognition   Behavior During Therapy: WFL for tasks assessed/performed Overall Cognitive Status: Within Functional Limits for tasks assessed                        Extremity/Trunk Assessment               Exercises     Shoulder Instructions       General Comments      Pertinent Vitals/ Pain       Pain Assessment: Faces Faces Pain Scale: Hurts even more Pain Location: R knee Pain Descriptors / Indicators: Sore Pain Intervention(s): Limited activity within patient's tolerance;Monitored during session;Repositioned  Home Living                                          Prior Functioning/Environment              Frequency           Progress Toward Goals  OT Goals(current goals can now be found in the care plan section)  Progress towards OT goals: Progressing toward goals     Plan      Co-evaluation  End of Session     Activity Tolerance Patient limited by pain   Patient Left in bed;with call bell/phone within reach;with bed alarm set   Nurse Communication  (ready for IV to be reconnected)        Time: 9806-9996 OT Time Calculation (min): 34 min  Charges: OT General Charges $OT Visit: 1 Procedure OT Treatments $Self Care/Home Management : 23-37 mins  Creg Gilmer 01/06/2016, 10:38 AM Lesle Chris, OTR/L [phone] 01/06/2016

## 2016-01-07 DIAGNOSIS — S8011XD Contusion of right lower leg, subsequent encounter: Secondary | ICD-10-CM

## 2016-01-07 DIAGNOSIS — M1711 Unilateral primary osteoarthritis, right knee: Secondary | ICD-10-CM

## 2016-01-07 LAB — COMPREHENSIVE METABOLIC PANEL
ALBUMIN: 2.9 g/dL — AB (ref 3.5–5.0)
ALT: 18 U/L (ref 14–54)
AST: 17 U/L (ref 15–41)
Alkaline Phosphatase: 39 U/L (ref 38–126)
Anion gap: 6 (ref 5–15)
BUN: 13 mg/dL (ref 6–20)
CHLORIDE: 105 mmol/L (ref 101–111)
CO2: 25 mmol/L (ref 22–32)
CREATININE: 0.72 mg/dL (ref 0.44–1.00)
Calcium: 8.4 mg/dL — ABNORMAL LOW (ref 8.9–10.3)
GFR calc non Af Amer: >60 mL/min (ref >60)
GLUCOSE: 146 mg/dL — AB (ref 65–99)
Potassium: 3.9 mmol/L (ref 3.5–5.1)
SODIUM: 136 mmol/L (ref 135–145)
Total Bilirubin: 0.3 mg/dL (ref 0.3–1.2)
Total Protein: 5.7 g/dL — ABNORMAL LOW (ref 6.5–8.1)

## 2016-01-07 LAB — CBC
HCT: 26.5 % — ABNORMAL LOW (ref 36.0–46.0)
HEMOGLOBIN: 9 g/dL — AB (ref 12.0–15.0)
MCH: 31.3 pg (ref 26.0–34.0)
MCHC: 34 g/dL (ref 30.0–36.0)
MCV: 92 fL (ref 78.0–100.0)
Platelets: 261 10*3/uL (ref 150–400)
RBC: 2.88 MIL/uL — AB (ref 3.87–5.11)
RDW: 13 % (ref 11.5–15.5)
WBC: 11.6 10*3/uL — AB (ref 4.0–10.5)

## 2016-01-07 LAB — CK: Total CK: 76 U/L (ref 38–234)

## 2016-01-07 NOTE — Progress Notes (Signed)
Physical Therapy Treatment Patient Details Name: Sarah Cobb MRN: [id number] DOB: [birth year] Today's Date: 01/07/2016    History of Present Illness s/p r UKR    PT Comments    POD # 5 Spouse present for "hands on" instruction.  Practiced one step backward and 4 steps forward.  Issued one crutch to use to get upstairs.  HEP handout also given.    Follow Up Recommendations  Outpatient PT;Home health PT     Equipment Recommendations  None recommended by PT    Recommendations for Other Services       Precautions / Restrictions Precautions Precautions: Fall;Knee Restrictions Weight Bearing Restrictions: No Other Position/Activity Restrictions: pt placing min weight on LLE    Mobility  Bed Mobility Overal bed mobility: Needs Assistance Bed Mobility: Supine to Sit;Sit to Supine     Supine to sit: Supervision Sit to supine: Min guard   General bed mobility comments: extra time.  MinGuard A for LLE getting back to bed  Transfers Overall transfer level: Needs assistance Equipment used: Rolling walker (2 wheeled) Transfers: Sit to/from [employer] Transfers   Stand pivot transfers: Supervision       General transfer comment: cues for hand placement and LE management.   assisted in/out bathroom as well  Ambulation/Gait Ambulation/Gait assistance: Min guard;Supervision Ambulation Distance (Feet): 42 Feet Assistive device: Rolling walker (2 wheeled) Gait Pattern/deviations: Step-to pattern     General Gait Details: toleratedincreased distance and increase WBing thru R LE   Stairs Stairs: Yes     Number of Stairs: 5 General stair comments: one step backward and 4 steps forward usingone rail and one crutch with spouse present for "handson" education  Wheelchair Mobility    Modified Rankin (Stroke Patients Only)       Balance                                    Cognition Arousal/Alertness: Awake/alert Behavior During Therapy: WFL  for tasks assessed/performed Overall Cognitive Status: Within Functional Limits for tasks assessed                      Exercises      General Comments        Pertinent Vitals/Pain Pain Assessment: 0-10 Pain Score: 4  Pain Location: R knee Pain Descriptors / Indicators: Aching;Discomfort;Grimacing;Operative site guarding Pain Intervention(s): Monitored during session;Repositioned;Ice applied    Home Living                      Prior Function            PT Goals (current goals can now be found in the care plan section)      Frequency           PT Plan      Co-evaluation             End of Session Equipment Utilized During Treatment: Gait belt Activity Tolerance: Patient tolerated treatment well Patient left: in bed;with call bell/phone within reach;with family/visitor present     Time: 1212-1256 PT Time Calculation (min) (ACUTE ONLY): 44 min  Charges:  $Gait Training: 23-37 mins $Therapeutic Activity: 8-22 mins                    G Codes:      Rica Koyanagi  PTA [facility]  Acute  Rehab Pager  [phone]  

## 2016-01-07 NOTE — Progress Notes (Signed)
   01/07/16 1000  OT Visit Information  Last OT Received On 01/07/16  Assistance Needed +1  History of Present Illness s/p r UKR  Precautions  Precautions Fall;Knee  Pain Assessment  Pain Assessment No/denies pain (working at bed level)  Cognition  Arousal/Alertness Awake/alert  Behavior During Therapy WFL for tasks assessed/performed  Overall Cognitive Status Within Functional Limits for tasks assessed  ADL  General ADL Comments husband bought sock aide.  Used this on non-affected leg at bed level as well as reacher to remove sock.  Pt really wants reacher and husband will get this for her.  Educated on tub bench:  brought by and demonstrated transfer.  Pt verbalizes understanding and did not feel she needed to practice this.  Restrictions  Weight Bearing Restrictions No  OT - End of Session  Activity Tolerance Patient tolerated treatment well  Patient left in bed;with call bell/phone within reach;with bed alarm set;with family/visitor present  OT Assessment/Plan  Follow Up Recommendations No OT follow up;Supervision/Assistance - 24 hour;Supervision - Intermittent  OT Equipment 3 in 1 bedside commode;Tub/shower bench (pt had seat delivered and wants to exchange for bench)  OT Goal Progression  Progress towards OT goals Progressing toward goals  OT Time Calculation  OT Start Time (ACUTE ONLY) 1017  OT Stop Time (ACUTE ONLY) 1028  OT Time Calculation (min) 11 min  OT General Charges  $OT Visit 1 Procedure  OT Treatments  $Self Care/Home Management  8-22 mins  Lesle Chris, OTR/L [phone] 01/07/2016

## 2016-01-07 NOTE — Progress Notes (Signed)
PROGRESS NOTE                                                                                                                                                                                                             Patient Demographics:    Sarah Cobb, is a 46 y.o. female, DOB - 1970/08/02, VUY:[id number]  Admit date - 01/02/2016   Admitting Physician Susa Day, MD  Outpatient Primary MD for the patient is SPENCER,SARA C, PA-C  LOS - 5    No chief complaint on file.      Brief Narrative   46 y.o.female,Who underwent right knee patellofemoral arthroplasty 12/30, and developed redness and swelling around right knee extending up to thigh, patient also became tachycardic with elevated lactate 2.9, leukocytosis with WBC 18.5. Hospital medicine consulted for possible cellulitis.Chest x-ray showed no active disease.UA negative for any abnormality Patient empirically started on IV cefazolin.   Subjective:    rt thigh swelling and erythema / ecchymosis much improved. C/o some soreness. afebrile   Assessment  & Plan :    Principal Problem:   Patellofemoral arthritis of right knee S/p surgery. toelated well. Pain control and DVT prophylaxis per primary team. Follow with Dr Tonita Cong as outpt.  Active Problems: ? Cellulitis of rt thigh Discussed with Dr Tonita Cong. Suggested this appeared more likely ecchymosis from muscle trauma. MRI of the leg shows inflammatory changes. Was ordered for broad spectrum abx but ortho thought this was unlikely infectious. Symptoms and exam havfe improved off abx  And agree this is likely non infectious. She remains afebrile and wbc has improved.  CPK normal. Can be monitored off abx.  Elevated blood glucose Blood glucose 125-160. No hx of DM. A1C ordered by primary team. This can be followed by PCP during outpt follow up. No intervention recommended at this time.  Will sign off . Patient  planned to be discharged home today.      Code Status : full code  Family Communication  : noen at bedside  Disposition Plan  : home    Lab Results  Component Value Date   PLT 261 01/07/2016    Antibiotics  :    Anti-infectives    Start     Dose/Rate Route Frequency Ordered Stop   01/06/16 2200  vancomycin (VANCOCIN) IVPB 1000 mg/200 mL premix  Status:  Discontinued     1,000 mg 200 mL/hr over 60 Minutes Intravenous Every 12 hours 01/06/16 1307 01/06/16 1343   01/06/16 1800  ceFAZolin (ANCEF) IVPB 1 g/50 mL premix  Status:  Discontinued     1 g 100 mL/hr over 30 Minutes Intravenous Every 8 hours 01/06/16 1558 01/07/16 0815   01/06/16 1800  clindamycin (CLEOCIN) IVPB 600 mg  Status:  Discontinued     600 mg 100 mL/hr over 30 Minutes Intravenous Every 8 hours 01/06/16 1656 01/07/16 0815   01/06/16 1400  vancomycin (VANCOCIN) IVPB 1000 mg/200 mL premix  Status:  Discontinued     1,000 mg 200 mL/hr over 60 Minutes Intravenous  Once 01/06/16 1307 01/06/16 1343   01/05/16 1600  ceFAZolin (ANCEF) IVPB 1 g/50 mL premix  Status:  Discontinued     1 g 100 mL/hr over 30 Minutes Intravenous Every 8 hours 01/05/16 1542 01/06/16 0954   01/05/16 1200  cephALEXin (KEFLEX) capsule 1,000 mg  Status:  Discontinued     1,000 mg Oral Every 12 hours 01/05/16 1028 01/05/16 1054   01/02/16 2000  ceFAZolin (ANCEF) IVPB 2g/100 mL premix     2 g 200 mL/hr over 30 Minutes Intravenous Every 6 hours 01/02/16 1714 01/03/16 0833   01/02/16 1254  polymyxin B 500,000 Units, bacitracin 50,000 Units in sodium chloride irrigation 0.9 % 500 mL irrigation  Status:  Discontinued       As needed 01/02/16 1254 01/02/16 1442   01/02/16 1015  ceFAZolin (ANCEF) IVPB 2g/100 mL premix     2 g 200 mL/hr over 30 Minutes Intravenous On call to O.R. 01/02/16 1008 01/02/16 1158        Objective:   Vitals:   01/06/16 1815 01/06/16 1825 01/06/16 2120 01/07/16 0519  BP:   103/62 111/69  Pulse: (!) 122 (!) 117 (!)  113 (!) 101  Resp: 18  18 18   Temp: 98.9 F (37.2 C)  98.4 F (36.9 C) 98.3 F (36.8 C)  TempSrc: Oral  Oral Oral  SpO2: 100%  96% 98%  Weight:      Height:        Wt Readings from Last 3 Encounters:  01/02/16 73.5 kg (162 lb)  12/26/15 73.5 kg (162 lb)  03/21/15 65.3 kg (144 lb)     Intake/Output Summary (Last 24 hours) at 01/07/16 1025 Last data filed at 01/07/16 0737  Gross per 24 hour  Intake             1255 ml  Output                0 ml  Net             1255 ml     Physical Exam  Gen: not in distress HEENT: , moist mucosa, supple neck Chest: clear b/l, no added sounds CVS: N S1&S2, no murmurs,  GI: soft, NT, ND,  Musculoskeletal: warm, dressing over  rt thigh, ecchymosis / erythema over rt medial thigh  much improved ( based on markings from yday and prior images). No warmth or tenderness     Data Review:    CBC  Recent Labs Lab 01/03/16 0446 01/04/16 0505 01/05/16 0417 01/06/16 0437 01/07/16 0447  WBC 18.5* 13.8* 16.7* 15.0* 11.6*  HGB 10.9* 10.7* 11.3* 9.5* 9.0*  HCT 31.9* 31.6* 33.5* 28.1* 26.5*  PLT 314 279 308 252 261  MCV 89.4 91.9 92.5 92.1 92.0  MCH 30.5 31.1 31.2 31.1 31.3  MCHC 34.2  33.9 33.7 33.8 34.0  RDW 12.5 13.2 13.1 13.0 13.0    Chemistries   Recent Labs Lab 01/03/16 0446 01/07/16 0447  NA 137 136  K 4.3 3.9  CL 105 105  CO2 21* 25  GLUCOSE 227* 146*  BUN 9 13  CREATININE 0.73 0.72  CALCIUM 8.5* 8.4*  AST  --  17  ALT  --  18  ALKPHOS  --  39  BILITOT  --  0.3   ------------------------------------------------------------------------------------------------------------------ No results for input(s): CHOL, HDL, LDLCALC, TRIG, CHOLHDL, LDLDIRECT in the last 72 hours.  No results found for: HGBA1C ------------------------------------------------------------------------------------------------------------------ No results for input(s): TSH, T4TOTAL, T3FREE, THYROIDAB in the last 72 hours.  Invalid input(s):  FREET3 ------------------------------------------------------------------------------------------------------------------ No results for input(s): VITAMINB12, FOLATE, FERRITIN, TIBC, IRON, RETICCTPCT in the last 72 hours.  Coagulation profile No results for input(s): INR, PROTIME in the last 168 hours.  No results for input(s): DDIMER in the last 72 hours.  Cardiac Enzymes No results for input(s): CKMB, TROPONINI, MYOGLOBIN in the last 168 hours.  Invalid input(s): CK ------------------------------------------------------------------------------------------------------------------ No results found for: BNP  Inpatient Medications  Scheduled Meds: . acidophilus  1 capsule Oral Daily  . aspirin EC  325 mg Oral BID PC  . docusate sodium  100 mg Oral BID  . famotidine  20 mg Oral QHS  . loratadine  10 mg Oral Daily  . Norethindrone-Ethinyl Estradiol-Fe Biphas  1 tablet Oral q morning - 10a  . pantoprazole  40 mg Oral Daily  . pregabalin  50 mg Oral BID   Continuous Infusions: . lactated ringers Stopped (01/03/16 1238)   PRN Meds:.acetaminophen **OR** acetaminophen, ALPRAZolam, alum & mag hydroxide-simeth, bisacodyl, cyclobenzaprine, diphenhydrAMINE, HYDROmorphone (DILAUDID) injection, HYDROmorphone, magnesium citrate, menthol-cetylpyridinium **OR** phenol, [DISCONTINUED] methocarbamol **OR** methocarbamol (ROBAXIN)  IV, metoCLOPramide **OR** metoCLOPramide (REGLAN) injection, ondansetron **OR** ondansetron (ZOFRAN) IV, oxyCODONE, polyethylene glycol, SUMAtriptan, traMADol  Micro Results Recent Results (from the past 240 hour(s))  Culture, blood (Routine X 2) w Reflex to ID Panel     Status: None (Preliminary result)   Collection Time: 01/05/16 11:37 AM  Result Value Ref Range Status   Specimen Description BLOOD RIGHT ARM  Final   Special Requests BOTTLES DRAWN AEROBIC ONLY 5CC  Final   Culture   Final    NO GROWTH < 24 HOURS Performed at [facility]    Report Status  PENDING  Incomplete  Culture, blood (Routine X 2) w Reflex to ID Panel     Status: None (Preliminary result)   Collection Time: 01/05/16 11:43 AM  Result Value Ref Range Status   Specimen Description BLOOD LEFT ARM  Final   Special Requests BOTTLES DRAWN AEROBIC AND ANAEROBIC 5CC  Final   Culture   Final    NO GROWTH < 24 HOURS Performed at [facility]    Report Status PENDING  Incomplete    Radiology Reports Dg Chest 1 View  Result Date: 01/05/2016 CLINICAL DATA:  Patient with right lower extremity tenderness. EXAM: CHEST 1 VIEW COMPARISON:  Chest radiograph 10/02/2015. FINDINGS: The heart size and mediastinal contours are within normal limits. Both lungs are clear. The visualized skeletal structures are unremarkable. IMPRESSION: No active disease. Electronically Signed   By: Lovey Newcomer M.D.   On: 01/05/2016 11:38   Mr Femur Right W Wo Contrast  Result Date: 01/06/2016 CLINICAL DATA:  Right knee pain status post patellofemoral arthroplasty. EXAM: MRI OF THE RIGHT FEMUR WITHOUT AND WITH CONTRAST TECHNIQUE: Multiplanar, multisequence MR imaging of the  right femur was performed both before and after administration of intravenous contrast. CONTRAST:  46m MULTIHANCE GADOBENATE DIMEGLUMINE 529 MG/ML IV SOLN COMPARISON:  None. FINDINGS: Bones/Joint/Cartilage Right patellofemoral arthroplasty with susceptibility artifact partially obscuring the adjacent soft tissue and osseous structures. No acute fracture or dislocation. No periosteal reaction or bone destruction. No hip fracture or dislocation. Moderate right joint effusion with synovial thickening and enhancement which may be secondary postsurgical versus secondary to infection. Ligaments Medial and lateral collateral ligaments are grossly intact. Muscles and Tendons Muscle edema in the right vastus intermedius muscle and to lesser extent vastus medial and lateralis muscles which may reflect an infectious or inflammatory myositis given recent  surgery. Muscles are otherwise normal. No significant muscle atrophy. Soft tissues No other fluid collection or hematoma. IMPRESSION: 1. Muscle edema in the right vastus intermedius muscle and to lesser extent vastus medial and lateralis muscles which may reflect an infectious or inflammatory myositis given recent surgery. 2. Moderate right joint effusion with synovial thickening and enhancement which may be secondary postsurgical versus secondary to infection. Electronically Signed   By: HKathreen Devoid  On: 01/06/2016 15:31   Dg Bone Density (dxa)  Result Date: 12/30/2015 EXAM: DUAL X-RAY ABSORPTIOMETRY (DXA) FOR BONE MINERAL DENSITY IMPRESSION: Referring Physician: SDomingo PulseSPENCER PATIENT: Name: MBali, LynPatient ID: [id number] Date: 003-Feb-1972Height: 60.0 in. Sex: Female Measured: 12/30/2015 Weight: 162.0 lbs. Indications: Caucasian, Dexilant, Low Calcium Intake (269.3), Lryica, Premenopausal Fractures: Patella Treatments: Birth Control, Multivitamin ASSESSMENT: The BMD measured at Femur Total Right is 0.685 0.729 g/cm2 with a Z-score of -2.2. The Z-score is below the expected range for age. L-1 was excluded due to degenerative changes. ISCD recommends using Z-scores for assessment of pre-menopausal women, men between 271and 528yoa, and children under 236yoa. The diagnosis of osteoporosis in these patients should not be made on the basis of densitometric criteria alone. Osteoporosis may be diagnosed if there is low BMD with secondary causes, such as glucocorticoid therapy, hyperparathyroidism or hypogonadism. Site      Region      Measured Date Measured Age YA      AM      BMD T-score Z-score DualFemur Total Right 12/30/2015 46.3 -2.6 -2.2 0.685 g/cm2 AP Spine L2-L4 12/30/2015 46.3 -1.0 -0.9 1.086 g/cm2 RECOMMENDATION: All patients should ensure an adequate intake of dietary calcium (1200 mg/d) and vitamin D (800 IU daily) unless contraindicated, and avoid modifiable risk factors. Additional  treatment may be appropriate if there are other significant clinical risk factors. FOLLOW-UP: People with diagnosed cases of osteoporosis or osteopenia should be regularly tested for bone mineral density. For patients eligible for Medicare, routine testing is allowed once every two years. The testing frequency can be increased to one year for patients who have rapidly progressing disease, or for those who are receiving medical therapy to restore bone mass. I have reviewed this report, and agree with the above findings. [facility]Cla[facility] HospitalRad[facility] Electronically Signed   By: TKerby MoorsM.D.   On: 12/30/2015 16:41   Dg Knee Right Port  Result Date: 01/06/2016 CLINICAL DATA:  Postop right knee surgery EXAM: PORTABLE RIGHT KNEE - 1-2 VIEW COMPARISON:  01/02/2016 FINDINGS: The right knee demonstrates a patellofemoral arthroplasty without evidence of hardware failure complication. There is no significant joint effusion. There is no fracture or dislocation. The alignment is anatomic. Post-surgical changes noted in the surrounding soft tissues. IMPRESSION: Right patellofemoral arthroplasty without hardware failure or complication. Electronically Signed   By: HElbert Ewings  Patel   On: 01/06/2016 08:59   Dg Knee Right Port  Result Date: 01/02/2016 CLINICAL DATA:  Right knee replacement EXAM: PORTABLE RIGHT KNEE - 1-2 VIEW COMPARISON:  None. FINDINGS: The right knee demonstrates a patellofemoral arthroplasty without evidence of hardware failure complication. There is no significant joint effusion. There is no fracture or dislocation. The alignment is anatomic. Post-surgical changes noted in the surrounding soft tissues. IMPRESSION: Interval right patellofemoral arthroplasty. Electronically Signed   By: Kathreen Devoid   On: 01/02/2016 16:14    Time Spent in minutes  20   Louellen Molder M.D on 01/07/2016 at 10:25 AM  Between 7am to 7pm - Pager - [phone]  After 7pm go to [email or web address] - password [facility]  [facility] -  Office  [phone]

## 2016-01-07 NOTE — Progress Notes (Addendum)
Subjective: 5 Days Post-Op Procedure(s) (LRB): RIGHT KNEE PATELLA-FEMORAL ARTHROPLASTY (Right) Patient reports pain as 3 on 0-10 scale.    Objective: Vital signs in last 24 hours: Temp:  [98.3 F (36.8 C)-99.4 F (37.4 C)] 98.3 F (36.8 C) (01/03 0519) Pulse Rate:  [101-125] 101 (01/03 0519) Resp:  [18] 18 (01/03 0519) BP: (103-117)/(62-69) 111/69 (01/03 0519) SpO2:  [96 %-100 %] 98 % (01/03 0519)  Intake/Output from previous day: 01/02 0701 - 01/03 0700 In: 1610 [P.O.:1200; I.V.:95; IV Piggyback:200] Out: 600 [Urine:600] Intake/Output this shift: No intake/output data recorded.   Recent Labs  01/05/16 0417 01/06/16 0437 01/07/16 0447  HGB 11.3* 9.5* 9.0*    Recent Labs  01/06/16 0437 01/07/16 0447  WBC 15.0* 11.6*  RBC 3.05* 2.88*  HCT 28.1* 26.5*  PLT 252 261    Recent Labs  01/07/16 0447  NA 136  K 3.9  CL 105  CO2 25  BUN 13  CREATININE 0.72  GLUCOSE 146*  CALCIUM 8.4*   No results for input(s): LABPT, INR in the last 72 hours.  Neurologically intact Intact pulses distally Dorsiflexion/Plantar flexion intact Incision: dressing C/D/I All ecchymosis.  No DVT. No sign of infection. MRI postop changes only. Reviewed by [employer].  Assessment/Plan: 5 Days Post-Op Procedure(s) (LRB): RIGHT KNEE PATELLA-FEMORAL ARTHROPLASTY (Right) Advance diet Up with therapy D/C IV fluids Discharge home with home health if final cultures negative. Home MVI with iron.  Home PT Elevation. I feel swelling and ecchymosis represented normal Postoperative bleeding and reactive erythema secondary As opposed to an infection. Lactate elevated due to surgery as was the WBC Postop.  Marybell Robards C 01/07/2016, 8:09 AM

## 2016-01-08 LAB — HEMOGLOBIN A1C
HEMOGLOBIN A1C: 5.6 % (ref 4.8–5.6)
MEAN PLASMA GLUCOSE: 114 mg/dL

## 2016-01-09 NOTE — Discharge Summary (Signed)
Physician Discharge Summary   Patient ID: Sarah Cobb MRN: [id number] DOB/AGE: 47/06/1970 47 y.o.  Admit date: 01/02/2016 Discharge date: 01/07/2016  Primary Diagnosis: Right knee post-traumatic patellofemoral arthrosis  Admission Diagnoses:  Past Medical History:  Diagnosis Date  . Anxiety   . Arrhythmia    Mitral valve regurgitation per pt- OV Dr Chancy Milroy, cardio 6/13- states had eccho and EKG  . Depression   . Family history of adverse reaction to anesthesia    don't know, she was adopted  . Fibromyalgia   . GERD (gastroesophageal reflux disease)   . Headache   . History of hiatal hernia   . History of kidney stones   . Knee pain, right    arthrofibrosis rt knee  . Mitral valve regurgitation   . Pneumonia   . Rosacea    Discharge Diagnoses:   Principal Problem:   Patellofemoral arthritis of right knee Active Problems:   Right knee DJD   Cellulitis   Infection  Estimated body mass index is 31.64 kg/m as calculated from the following:   Height as of this encounter: 5' (1.524 m).   Weight as of this encounter: 73.5 kg (162 lb).  Procedure:  Procedure(s) (LRB): RIGHT KNEE PATELLA-FEMORAL ARTHROPLASTY (Right)   Consults: hospitalists to r/o cellulitis  HPI: see H&P Laboratory Data: Admission on 01/02/2016, Discharged on 01/07/2016  Component Date Value Ref Range Status  . WBC 01/03/2016 18.5* 4.0 - 10.5 K/uL Final  . RBC 01/03/2016 3.57* 3.87 - 5.11 MIL/uL Final  . Hemoglobin 01/03/2016 10.9* 12.0 - 15.0 g/dL Final  . HCT 01/03/2016 31.9* 36.0 - 46.0 % Final  . MCV 01/03/2016 89.4  78.0 - 100.0 fL Final  . MCH 01/03/2016 30.5  26.0 - 34.0 pg Final  . MCHC 01/03/2016 34.2  30.0 - 36.0 g/dL Final  . RDW 01/03/2016 12.5  11.5 - 15.5 % Final  . Platelets 01/03/2016 314  150 - 400 K/uL Final  . Sodium 01/03/2016 137  135 - 145 mmol/L Final  . Potassium 01/03/2016 4.3  3.5 - 5.1 mmol/L Final  . Chloride 01/03/2016 105  101 - 111 mmol/L Final  . CO2 01/03/2016 21*  22 - 32 mmol/L Final  . Glucose, Bld 01/03/2016 227* 65 - 99 mg/dL Final  . BUN 01/03/2016 9  6 - 20 mg/dL Final  . Creatinine, Ser 01/03/2016 0.73  0.44 - 1.00 mg/dL Final  . Calcium 01/03/2016 8.5* 8.9 - 10.3 mg/dL Final  . GFR calc non Af Amer 01/03/2016 >60  >60 mL/min Final  . GFR calc Af Amer 01/03/2016 >60  >60 mL/min Final   Comment: (NOTE) The eGFR has been calculated using the CKD EPI equation. This calculation has not been validated in all clinical situations. eGFR's persistently <60 mL/min signify possible Chronic Kidney Disease.   . Anion gap 01/03/2016 11  5 - 15 Final  . WBC 01/04/2016 13.8* 4.0 - 10.5 K/uL Final  . RBC 01/04/2016 3.44* 3.87 - 5.11 MIL/uL Final  . Hemoglobin 01/04/2016 10.7* 12.0 - 15.0 g/dL Final  . HCT 01/04/2016 31.6* 36.0 - 46.0 % Final  . MCV 01/04/2016 91.9  78.0 - 100.0 fL Final  . MCH 01/04/2016 31.1  26.0 - 34.0 pg Final  . MCHC 01/04/2016 33.9  30.0 - 36.0 g/dL Final  . RDW 01/04/2016 13.2  11.5 - 15.5 % Final  . Platelets 01/04/2016 279  150 - 400 K/uL Final  . WBC 01/05/2016 16.7* 4.0 - 10.5 K/uL Final  . RBC 01/05/2016  3.62* 3.87 - 5.11 MIL/uL Final  . Hemoglobin 01/05/2016 11.3* 12.0 - 15.0 g/dL Final  . HCT 01/05/2016 33.5* 36.0 - 46.0 % Final  . MCV 01/05/2016 92.5  78.0 - 100.0 fL Final  . MCH 01/05/2016 31.2  26.0 - 34.0 pg Final  . MCHC 01/05/2016 33.7  30.0 - 36.0 g/dL Final  . RDW 01/05/2016 13.1  11.5 - 15.5 % Final  . Platelets 01/05/2016 308  150 - 400 K/uL Final  . Lactic Acid, Venous 01/05/2016 2.9* 0.5 - 1.9 mmol/L Final   Comment: CRITICAL RESULT CALLED TO, READ BACK BY AND VERIFIED WITH: J.LOVE RN [place]   . Lactic Acid, Venous 01/05/2016 1.6  0.5 - 1.9 mmol/L Final  . Color, Urine 01/05/2016 YELLOW  YELLOW Final  . APPearance 01/05/2016 CLEAR  CLEAR Final  . Specific Gravity, Urine 01/05/2016 1.018  1.005 - 1.030 Final  . pH 01/05/2016 5.0  5.0 - 8.0 Final  . Glucose, UA 01/05/2016 NEGATIVE  NEGATIVE  mg/dL Final  . Hgb urine dipstick 01/05/2016 MODERATE* NEGATIVE Final  . Bilirubin Urine 01/05/2016 NEGATIVE  NEGATIVE Final  . Ketones, ur 01/05/2016 NEGATIVE  NEGATIVE mg/dL Final  . Protein, ur 01/05/2016 NEGATIVE  NEGATIVE mg/dL Final  . Nitrite 01/05/2016 NEGATIVE  NEGATIVE Final  . Leukocytes, UA 01/05/2016 NEGATIVE  NEGATIVE Final  . RBC / HPF 01/05/2016 0-5  0 - 5 RBC/hpf Final  . WBC, UA 01/05/2016 0-5  0 - 5 WBC/hpf Final  . Bacteria, UA 01/05/2016 NONE SEEN  NONE SEEN Final  . Squamous Epithelial / LPF 01/05/2016 0-5* NONE SEEN Final  . Mucous 01/05/2016 PRESENT   Final  . Specimen Description 01/08/2016 BLOOD RIGHT ARM   Final  . Special Requests 01/08/2016 BOTTLES DRAWN AEROBIC ONLY 5CC   Final  . Culture 01/08/2016    Final                   Value:NO GROWTH 3 DAYS Performed at [facility]   . Report Status 01/08/2016 PENDING   Incomplete  . Specimen Description 01/08/2016 BLOOD LEFT ARM   Final  . Special Requests 01/08/2016 BOTTLES DRAWN AEROBIC AND ANAEROBIC 5CC   Final  . Culture 01/08/2016    Final                   Value:NO GROWTH 3 DAYS Performed at [facility]   . Report Status 01/08/2016 PENDING   Incomplete  . WBC 01/06/2016 15.0* 4.0 - 10.5 K/uL Final  . RBC 01/06/2016 3.05* 3.87 - 5.11 MIL/uL Final  . Hemoglobin 01/06/2016 9.5* 12.0 - 15.0 g/dL Final  . HCT 01/06/2016 28.1* 36.0 - 46.0 % Final  . MCV 01/06/2016 92.1  78.0 - 100.0 fL Final  . MCH 01/06/2016 31.1  26.0 - 34.0 pg Final  . MCHC 01/06/2016 33.8  30.0 - 36.0 g/dL Final  . RDW 01/06/2016 13.0  11.5 - 15.5 % Final  . Platelets 01/06/2016 252  150 - 400 K/uL Final  . WBC 01/07/2016 11.6* 4.0 - 10.5 K/uL Final  . RBC 01/07/2016 2.88* 3.87 - 5.11 MIL/uL Final  . Hemoglobin 01/07/2016 9.0* 12.0 - 15.0 g/dL Final  . HCT 01/07/2016 26.5* 36.0 - 46.0 % Final  . MCV 01/07/2016 92.0  78.0 - 100.0 fL Final  . MCH 01/07/2016 31.3  26.0 - 34.0 pg Final  . MCHC 01/07/2016 34.0  30.0 - 36.0  g/dL Final  . RDW 01/07/2016 13.0  11.5 -  15.5 % Final  . Platelets 01/07/2016 261  150 - 400 K/uL Final  . Sodium 01/07/2016 136  135 - 145 mmol/L Final  . Potassium 01/07/2016 3.9  3.5 - 5.1 mmol/L Final  . Chloride 01/07/2016 105  101 - 111 mmol/L Final  . CO2 01/07/2016 25  22 - 32 mmol/L Final  . Glucose, Bld 01/07/2016 146* 65 - 99 mg/dL Final  . BUN 01/07/2016 13  6 - 20 mg/dL Final  . Creatinine, Ser 01/07/2016 0.72  0.44 - 1.00 mg/dL Final  . Calcium 01/07/2016 8.4* 8.9 - 10.3 mg/dL Final  . Total Protein 01/07/2016 5.7* 6.5 - 8.1 g/dL Final  . Albumin 01/07/2016 2.9* 3.5 - 5.0 g/dL Final  . AST 01/07/2016 17  15 - 41 U/L Final  . ALT 01/07/2016 18  14 - 54 U/L Final  . Alkaline Phosphatase 01/07/2016 39  38 - 126 U/L Final  . Total Bilirubin 01/07/2016 0.3  0.3 - 1.2 mg/dL Final  . GFR calc non Af Amer 01/07/2016 >60  >60 mL/min Final  . GFR calc Af Amer 01/07/2016 >60  >60 mL/min Final   Comment: (NOTE) The eGFR has been calculated using the CKD EPI equation. This calculation has not been validated in all clinical situations. eGFR's persistently <60 mL/min signify possible Chronic Kidney Disease.   . Anion gap 01/07/2016 6  5 - 15 Final  . Hgb A1c MFr Bld 01/08/2016 5.6  4.8 - 5.6 % Final   Comment: (NOTE)         Pre-diabetes: 5.7 - 6.4         Diabetes: >6.4         Glycemic control for adults with diabetes: <7.0   . Mean Plasma Glucose 01/08/2016 114  mg/dL Final   Comment: (NOTE) Performed At: [facility] [place], [place] [id number] Lindon Romp MD ML:[id number]   . Total CK 01/07/2016 76  38 - 234 U/L Final  Hospital Outpatient Visit on 12/26/2015  Component Date Value Ref Range Status  . aPTT 12/26/2015 27  24 - 36 seconds Final  . Sodium 12/26/2015 141  135 - 145 mmol/L Final  . Potassium 12/26/2015 3.9  3.5 - 5.1 mmol/L Final  . Chloride 12/26/2015 105  101 - 111 mmol/L Final  . CO2 12/26/2015 27  22 - 32 mmol/L Final  .  Glucose, Bld 12/26/2015 123* 65 - 99 mg/dL Final  . BUN 12/26/2015 15  6 - 20 mg/dL Final  . Creatinine, Ser 12/26/2015 0.86  0.44 - 1.00 mg/dL Final  . Calcium 12/26/2015 9.5  8.9 - 10.3 mg/dL Final  . GFR calc non Af Amer 12/26/2015 >60  >60 mL/min Final  . GFR calc Af Amer 12/26/2015 >60  >60 mL/min Final   Comment: (NOTE) The eGFR has been calculated using the CKD EPI equation. This calculation has not been validated in all clinical situations. eGFR's persistently <60 mL/min signify possible Chronic Kidney Disease.   . Anion gap 12/26/2015 9  5 - 15 Final  . WBC 12/26/2015 8.5  4.0 - 10.5 K/uL Final  . RBC 12/26/2015 4.11  3.87 - 5.11 MIL/uL Final  . Hemoglobin 12/26/2015 12.6  12.0 - 15.0 g/dL Final  . HCT 12/26/2015 37.8  36.0 - 46.0 % Final  . MCV 12/26/2015 92.0  78.0 - 100.0 fL Final  . MCH 12/26/2015 30.7  26.0 - 34.0 pg Final  . MCHC 12/26/2015 33.3  30.0 - 36.0 g/dL Final  .  RDW 12/26/2015 12.9  11.5 - 15.5 % Final  . Platelets 12/26/2015 266  150 - 400 K/uL Final  . Prothrombin Time 12/26/2015 12.2  11.4 - 15.2 seconds Final  . INR 12/26/2015 0.91   Final  . ABO/RH(D) 01/02/2016 B POS   Final  . Antibody Screen 01/02/2016 NEG   Final  . Sample Expiration 01/02/2016 01/05/2016   Final  . Extend sample reason 01/02/2016 NO TRANSFUSIONS OR PREGNANCY IN THE PAST 3 MONTHS   Final  . Color, Urine 12/26/2015 YELLOW  YELLOW Final  . APPearance 12/26/2015 CLEAR  CLEAR Final  . Specific Gravity, Urine 12/26/2015 1.013  1.005 - 1.030 Final  . pH 12/26/2015 5.0  5.0 - 8.0 Final  . Glucose, UA 12/26/2015 NEGATIVE  NEGATIVE mg/dL Final  . Hgb urine dipstick 12/26/2015 SMALL* NEGATIVE Final  . Bilirubin Urine 12/26/2015 NEGATIVE  NEGATIVE Final  . Ketones, ur 12/26/2015 NEGATIVE  NEGATIVE mg/dL Final  . Protein, ur 12/26/2015 NEGATIVE  NEGATIVE mg/dL Final  . Nitrite 12/26/2015 NEGATIVE  NEGATIVE Final  . Leukocytes, UA 12/26/2015 NEGATIVE  NEGATIVE Final  . RBC / HPF 12/26/2015  0-5  0 - 5 RBC/hpf Final  . WBC, UA 12/26/2015 0-5  0 - 5 WBC/hpf Final  . Bacteria, UA 12/26/2015 RARE* NONE SEEN Final  . Squamous Epithelial / LPF 12/26/2015 0-5* NONE SEEN Final  . Mucous 12/26/2015 PRESENT   Final  . MRSA, PCR 12/26/2015 NEGATIVE  NEGATIVE Final  . Staphylococcus aureus 12/26/2015 NEGATIVE  NEGATIVE Final   Comment:        The Xpert SA Assay (FDA approved for NASAL specimens in patients over 72 years of age), is one component of a comprehensive surveillance program.  Test performance has been validated by [facility] for patients greater than or equal to 74 year old. It is not intended to diagnose infection nor to guide or monitor treatment.   . Preg, Serum 12/26/2015 NEGATIVE  NEGATIVE Final   Comment:        THE SENSITIVITY OF THIS METHODOLOGY IS >10 mIU/mL.   . ABO/RH(D) 12/26/2015 B POS   Final     X-Rays:Dg Chest 1 View  Result Date: 01/05/2016 CLINICAL DATA:  Patient with right lower extremity tenderness. EXAM: CHEST 1 VIEW COMPARISON:  Chest radiograph 10/02/2015. FINDINGS: The heart size and mediastinal contours are within normal limits. Both lungs are clear. The visualized skeletal structures are unremarkable. IMPRESSION: No active disease. Electronically Signed   By: Lovey Newcomer M.D.   On: 01/05/2016 11:38   Mr Femur Right W Wo Contrast  Result Date: 01/06/2016 CLINICAL DATA:  Right knee pain status post patellofemoral arthroplasty. EXAM: MRI OF THE RIGHT FEMUR WITHOUT AND WITH CONTRAST TECHNIQUE: Multiplanar, multisequence MR imaging of the right femur was performed both before and after administration of intravenous contrast. CONTRAST:  51m MULTIHANCE GADOBENATE DIMEGLUMINE 529 MG/ML IV SOLN COMPARISON:  None. FINDINGS: Bones/Joint/Cartilage Right patellofemoral arthroplasty with susceptibility artifact partially obscuring the adjacent soft tissue and osseous structures. No acute fracture or dislocation. No periosteal reaction or bone destruction. No  hip fracture or dislocation. Moderate right joint effusion with synovial thickening and enhancement which may be secondary postsurgical versus secondary to infection. Ligaments Medial and lateral collateral ligaments are grossly intact. Muscles and Tendons Muscle edema in the right vastus intermedius muscle and to lesser extent vastus medial and lateralis muscles which may reflect an infectious or inflammatory myositis given recent surgery. Muscles are otherwise normal. No significant muscle atrophy. Soft tissues  No other fluid collection or hematoma. IMPRESSION: 1. Muscle edema in the right vastus intermedius muscle and to lesser extent vastus medial and lateralis muscles which may reflect an infectious or inflammatory myositis given recent surgery. 2. Moderate right joint effusion with synovial thickening and enhancement which may be secondary postsurgical versus secondary to infection. Electronically Signed   By: Kathreen Devoid   On: 01/06/2016 15:31   Dg Bone Density (dxa)  Result Date: 12/30/2015 EXAM: DUAL X-RAY ABSORPTIOMETRY (DXA) FOR BONE MINERAL DENSITY IMPRESSION: Referring Physician: Domingo Pulse SPENCER PATIENT: Name: Analya, Louissaint Patient ID: [id number] Birth Date: [birth year] Height: 60.0 in. Sex: Female Measured: 12/30/2015 Weight: 162.0 lbs. Indications: Caucasian, Dexilant, Low Calcium Intake (269.3), Lry[place], Premenopausal Fractures: Patella Treatments: Birth Control, Multivitamin ASSESSMENT: The BMD measured at Femur Total Right is 0.685 0.729 g/cm2 with a Z-score of -2.2. The Z-score is below the expected range for age. L-1 was excluded due to degenerative changes. ISCD recommends using Z-scores for assessment of pre-menopausal women, men between 48 and 46 yoa, and children under 63 yoa. The diagnosis of osteoporosis in these patients should not be made on the basis of densitometric criteria alone. Osteoporosis may be diagnosed if there is low BMD with secondary causes, such as glucocorticoid  therapy, hyperparathyroidism or hypogonadism. Site      Region      Measured Date Measured Age YA      AM      BMD T-score Z-score DualFemur Total Right 12/30/2015 46.3 -2.6 -2.2 0.685 g/cm2 AP Spine L2-L4 12/30/2015 46.3 -1.0 -0.9 1.086 g/cm2 RECOMMENDATION: All patients should ensure an adequate intake of dietary calcium (1200 mg/d) and vitamin D (800 IU daily) unless contraindicated, and avoid modifiable risk factors. Additional treatment may be appropriate if there are other significant clinical risk factors. FOLLOW-UP: People with diagnosed cases of osteoporosis or osteopenia should be regularly tested for bone mineral density. For patients eligible for Medicare, routine testing is allowed once every two years. The testing frequency can be increased to one year for patients who have rapidly progressing disease, or for those who are receiving medical therapy to restore bone mass. I have reviewed this report, and agree with the above findings. [facility]iology Electronically Signed   By: Kerby Moors M.D.   On: 12/30/2015 16:41   Dg Knee Right Port  Result Date: 01/06/2016 CLINICAL DATA:  Postop right knee surgery EXAM: PORTABLE RIGHT KNEE - 1-2 VIEW COMPARISON:  01/02/2016 FINDINGS: The right knee demonstrates a patellofemoral arthroplasty without evidence of hardware failure complication. There is no significant joint effusion. There is no fracture or dislocation. The alignment is anatomic. Post-surgical changes noted in the surrounding soft tissues. IMPRESSION: Right patellofemoral arthroplasty without hardware failure or complication. Electronically Signed   By: Kathreen Devoid   On: 01/06/2016 08:59   Dg Knee Right Port  Result Date: 01/02/2016 CLINICAL DATA:  Right knee replacement EXAM: PORTABLE RIGHT KNEE - 1-2 VIEW COMPARISON:  None. FINDINGS: The right knee demonstrates a patellofemoral arthroplasty without evidence of hardware failure complication. There is no significant joint effusion.  There is no fracture or dislocation. The alignment is anatomic. Post-surgical changes noted in the surrounding soft tissues. IMPRESSION: Interval right patellofemoral arthroplasty. Electronically Signed   By: Kathreen Devoid   On: 01/02/2016 16:14    EKG: Orders placed or performed during the hospital encounter of 10/02/15  . EKG 12-Lead  . EKG 12-Lead  . ED EKG within 10 minutes  . ED EKG within 10 minutes  .  EKG     Hospital Course: Kelie Gainey is a 46 y.o. who was admitted to [facility]. They were brought to the operating room on 01/02/2016 and underwent Procedure(s): RIGHT KNEE PATELLA-FEMORAL ARTHROPLASTY.  Patient tolerated the procedure well and was later transferred to the recovery room and then to the orthopaedic floor for postoperative care.  They were given PO and IV analgesics for pain control following their surgery.  They were given 24 hours of postoperative antibiotics of  Anti-infectives    Start     Dose/Rate Route Frequency Ordered Stop   01/06/16 2200  vancomycin (VANCOCIN) IVPB 1000 mg/200 mL premix  Status:  Discontinued     1,000 mg 200 mL/hr over 60 Minutes Intravenous Every 12 hours 01/06/16 1307 01/06/16 1343   01/06/16 1800  ceFAZolin (ANCEF) IVPB 1 g/50 mL premix  Status:  Discontinued     1 g 100 mL/hr over 30 Minutes Intravenous Every 8 hours 01/06/16 1558 01/07/16 0815   01/06/16 1800  clindamycin (CLEOCIN) IVPB 600 mg  Status:  Discontinued     600 mg 100 mL/hr over 30 Minutes Intravenous Every 8 hours 01/06/16 1656 01/07/16 0815   01/06/16 1400  vancomycin (VANCOCIN) IVPB 1000 mg/200 mL premix  Status:  Discontinued     1,000 mg 200 mL/hr over 60 Minutes Intravenous  Once 01/06/16 1307 01/06/16 1343   01/05/16 1600  ceFAZolin (ANCEF) IVPB 1 g/50 mL premix  Status:  Discontinued     1 g 100 mL/hr over 30 Minutes Intravenous Every 8 hours 01/05/16 1542 01/06/16 0954   01/05/16 1200  cephALEXin (KEFLEX) capsule 1,000 mg  Status:  Discontinued      1,000 mg Oral Every 12 hours 01/05/16 1028 01/05/16 1054   01/02/16 2000  ceFAZolin (ANCEF) IVPB 2g/100 mL premix     2 g 200 mL/hr over 30 Minutes Intravenous Every 6 hours 01/02/16 1714 01/03/16 0833   01/02/16 1254  polymyxin B 500,000 Units, bacitracin 50,000 Units in sodium chloride irrigation 0.9 % 500 mL irrigation  Status:  Discontinued       As needed 01/02/16 1254 01/02/16 1442   01/02/16 1015  ceFAZolin (ANCEF) IVPB 2g/100 mL premix     2 g 200 mL/hr over 30 Minutes Intravenous On call to O.R. 01/02/16 1008 01/02/16 1158     and started on DVT prophylaxis in the form of Aspirin, TED hose and SCDs.   PT and OT were ordered for total joint protocol.  Discharge planning consulted to help with postop disposition and equipment needs.  Patient had a fair night on the evening of surgery.  They started to get up OOB with therapy on day one.  Continued to work with therapy into day two but CPM was held due to increased pain. By day three in morning rounds there was concern for infection given increased white count as well as increased pain, swelling, and erythema consistent with normal post-op changes after PFJ mid-vastus exposure. Hospitalist service was consulted. She was started on empiric abx and sepsis protocol was initiated. An MRI was performed of her R thigh which was negative for any fluid collection but showed normal post-operative changes. Antibiotics were discontinued. On POD 5 she was discharged home with HHPT in good condition.   Diet: Regular diet Activity:WBAT Follow-up:in 10-14 days Disposition - Home Discharged Condition: good   Discharge Instructions    Call MD / Call 911    Complete by:  As directed    If you  experience chest pain or shortness of breath, CALL 911 and be transported to the hospital emergency room.  If you develope a fever above 101 F, pus (white drainage) or increased drainage or redness at the wound, or calf pain, call your surgeon's office.    Constipation Prevention    Complete by:  As directed    Drink plenty of fluids.  Prune juice may be helpful.  You may use a stool softener, such as Colace (over the counter) 100 mg twice a day.  Use MiraLax (over the counter) for constipation as needed.   Diet - low sodium heart healthy    Complete by:  As directed    Increase activity slowly as tolerated    Complete by:  As directed      Allergies as of 01/07/2016      Reactions   Diazepam Palpitations   Tachycardia  (intolerance)      Medication List    STOP taking these medications   azithromycin 250 MG tablet Commonly known as:  ZITHROMAX   HYDROcodone-acetaminophen 5-325 MG tablet Commonly known as:  NORCO/VICODIN   oxyCODONE 5 MG immediate release tablet Commonly known as:  Oxy IR/ROXICODONE     TAKE these medications   ALPRAZolam 0.25 MG tablet Commonly known as:  XANAX Take 0.25 mg by mouth 3 (three) times daily as needed for anxiety.   aspirin EC 325 MG tablet Take 1 tablet (325 mg total) by mouth 2 (two) times daily.   cetirizine 10 MG tablet Commonly known as:  ZYRTEC Take 10 mg by mouth as needed for allergies.   cyclobenzaprine 10 MG tablet Commonly known as:  FLEXERIL Take 1 tablet (10 mg total) by mouth 3 (three) times daily as needed for muscle spasms.   DEXILANT 60 MG capsule Generic drug:  dexlansoprazole Take 60 mg by mouth every morning.   docusate sodium 100 MG capsule Commonly known as:  COLACE Take 1 capsule (100 mg total) by mouth 2 (two) times daily as needed for mild constipation.   flintstones complete 60 MG chewable tablet Chew 1 tablet by mouth daily.   HYDROmorphone 2 MG tablet Commonly known as:  DILAUDID Take 1 tablet (2 mg total) by mouth every 3 (three) hours as needed for severe pain.   LO LOESTRIN FE 1 MG-10 MCG / 10 MCG tablet Generic drug:  Norethindrone-Ethinyl Estradiol-Fe Biphas Take 1 tablet by mouth every morning.   LYRICA 50 MG capsule Generic drug:   pregabalin Take 50 mg by mouth 2 (two) times daily.   Lysine 1000 MG Tabs Take 1,000 mg by mouth daily as needed (cold sore/fever blisters).   ondansetron 4 MG tablet Commonly known as:  ZOFRAN Take 1 tablet (4 mg total) by mouth every 8 (eight) hours as needed for nausea or vomiting.   polyethylene glycol packet Commonly known as:  MIRALAX / GLYCOLAX Take 17 g by mouth daily.   ranitidine 150 MG tablet Commonly known as:  ZANTAC Take 150 mg by mouth 2 (two) times daily as needed. For acid reflux/indigestion   SUMAtriptan 50 MG tablet Commonly known as:  IMITREX Take 50 mg by mouth daily as needed for migraine.   traMADol 50 MG tablet Commonly known as:  ULTRAM Take 50 mg by mouth every 12 (twelve) hours as needed.      Follow-up Information    BEANE,JEFFREY C, MD Follow up in 10 day(s).   Specialty:  Orthopedic Surgery Why:  For suture removal 10 days post-op Contact  information: [address] [place] [address] [phone]        [facility] Follow up.   Why:  physical therapy Contact information: [phone]          Signed: Lacie Draft, PA-C Orthopaedic Surgery 01/09/2016, 8:54 AM

## 2016-01-10 LAB — CULTURE, BLOOD (ROUTINE X 2)
CULTURE: NO GROWTH
Culture: NO GROWTH

## 2016-03-12 ENCOUNTER — Encounter (HOSPITAL_COMMUNITY): Payer: Self-pay

## 2016-03-12 ENCOUNTER — Emergency Department (HOSPITAL_COMMUNITY)
Admission: EM | Admit: 2016-03-12 | Discharge: 2016-03-12 | Disposition: A | Discharge status: Home / Self Care | Payer: BC Managed Care – PPO | Attending: Emergency Medicine | Admitting: Emergency Medicine

## 2016-03-12 ENCOUNTER — Emergency Department (HOSPITAL_COMMUNITY): Payer: BC Managed Care – PPO

## 2016-03-12 DIAGNOSIS — N200 Calculus of kidney: Secondary | ICD-10-CM | POA: Diagnosis not present

## 2016-03-12 DIAGNOSIS — R3129 Other microscopic hematuria: Secondary | ICD-10-CM | POA: Diagnosis not present

## 2016-03-12 DIAGNOSIS — R109 Unspecified abdominal pain: Secondary | ICD-10-CM

## 2016-03-12 DIAGNOSIS — Z87891 Personal history of nicotine dependence: Secondary | ICD-10-CM | POA: Diagnosis not present

## 2016-03-12 DIAGNOSIS — Z96651 Presence of right artificial knee joint: Secondary | ICD-10-CM | POA: Diagnosis not present

## 2016-03-12 DIAGNOSIS — Z7982 Long term (current) use of aspirin: Secondary | ICD-10-CM | POA: Diagnosis not present

## 2016-03-12 DIAGNOSIS — Z79899 Other long term (current) drug therapy: Secondary | ICD-10-CM | POA: Insufficient documentation

## 2016-03-12 LAB — PREGNANCY, URINE: Preg Test, Ur: NEGATIVE

## 2016-03-12 LAB — URINALYSIS, ROUTINE W REFLEX MICROSCOPIC
Bilirubin Urine: NEGATIVE
Glucose, UA: NEGATIVE mg/dL
Ketones, ur: NEGATIVE mg/dL
Leukocytes, UA: NEGATIVE
Nitrite: NEGATIVE
Protein, ur: NEGATIVE mg/dL
SPECIFIC GRAVITY, URINE: 1.02 (ref 1.005–1.030)
pH: 5 (ref 5.0–8.0)

## 2016-03-12 LAB — CBC WITH DIFFERENTIAL/PLATELET
Basophils Absolute: 0 10*3/uL (ref 0.0–0.1)
Basophils Relative: 0 %
EOS ABS: 0.2 10*3/uL (ref 0.0–0.7)
EOS PCT: 2 %
HCT: 36.6 % (ref 36.0–46.0)
HEMOGLOBIN: 12.7 g/dL (ref 12.0–15.0)
Lymphocytes Relative: 21 %
Lymphs Abs: 2.7 10*3/uL (ref 0.7–4.0)
MCH: 30.9 pg (ref 26.0–34.0)
MCHC: 34.7 g/dL (ref 30.0–36.0)
MCV: 89.1 fL (ref 78.0–100.0)
MONOS PCT: 3 %
Monocytes Absolute: 0.4 10*3/uL (ref 0.1–1.0)
NEUTROS PCT: 74 %
Neutro Abs: 9.7 10*3/uL — ABNORMAL HIGH (ref 1.7–7.7)
Platelets: 257 10*3/uL (ref 150–400)
RBC: 4.11 MIL/uL (ref 3.87–5.11)
RDW: 12.9 % (ref 11.5–15.5)
WBC: 13 10*3/uL — ABNORMAL HIGH (ref 4.0–10.5)

## 2016-03-12 LAB — COMPREHENSIVE METABOLIC PANEL
ALBUMIN: 3.9 g/dL (ref 3.5–5.0)
ALK PHOS: 68 U/L (ref 38–126)
ALT: 32 U/L (ref 14–54)
AST: 27 U/L (ref 15–41)
Anion gap: 6 (ref 5–15)
BUN: 17 mg/dL (ref 6–20)
CALCIUM: 9.1 mg/dL (ref 8.9–10.3)
CO2: 23 mmol/L (ref 22–32)
CREATININE: 0.89 mg/dL (ref 0.44–1.00)
Chloride: 110 mmol/L (ref 101–111)
GFR calc Af Amer: >60 mL/min (ref >60)
GFR calc non Af Amer: >60 mL/min (ref >60)
GLUCOSE: 124 mg/dL — AB (ref 65–99)
Potassium: 3.5 mmol/L (ref 3.5–5.1)
SODIUM: 139 mmol/L (ref 135–145)
Total Bilirubin: 0.5 mg/dL (ref 0.3–1.2)
Total Protein: 7.2 g/dL (ref 6.5–8.1)

## 2016-03-12 LAB — LIPASE, BLOOD: Lipase: 58 U/L — ABNORMAL HIGH (ref 11–51)

## 2016-03-12 MED ORDER — OXYCODONE-ACETAMINOPHEN 5-325 MG PO TABS: 1.0000 | ORAL_TABLET | ORAL | 0 refills | Status: DC | PRN

## 2016-03-12 MED ORDER — KETOROLAC TROMETHAMINE 30 MG/ML IJ SOLN
30.0000 mg | Freq: Once | INTRAMUSCULAR | Status: AC
  Administered 2016-03-12: 30 mg via INTRAVENOUS
  Filled 2016-03-12: qty 1

## 2016-03-12 MED ORDER — ONDANSETRON HCL 4 MG/2ML IJ SOLN
4.0000 mg | Freq: Once | INTRAMUSCULAR | Status: AC
  Administered 2016-03-12: 4 mg via INTRAVENOUS
  Filled 2016-03-12: qty 2

## 2016-03-12 MED ORDER — MORPHINE SULFATE (PF) 4 MG/ML IV SOLN
4.0000 mg | Freq: Once | INTRAVENOUS | Status: AC
  Administered 2016-03-12: 4 mg via INTRAVENOUS
  Filled 2016-03-12: qty 1

## 2016-03-12 NOTE — ED Notes (Signed)
Pt c/o left flank pain for about an hour. Sudden onset of intense pain. Pt feels as if they cannot urinate.

## 2016-03-12 NOTE — Discharge Instructions (Signed)
Return if pain is not being adequately controlled, or if you start running a fever, or if you are vomiting and unable to hold medication on your stomach.

## 2016-03-12 NOTE — ED Provider Notes (Signed)
[facility] DEPT Provider Note   CSN: [id number] Arrival date & time: 03/12/16  0015   By signing my name below, I, Eunice Blase, attest that this documentation has been prepared under the direction and in the presence of Delora Fuel, MD. Electronically signed, Eunice Blase, ED Scribe. 03/12/16. 12:39 AM.   History   Chief Complaint Chief Complaint  Patient presents with  . Flank Pain   HPI  HPI Comments: Sarah Cobb is a 46 y.o. female with Hx of a right kidney stone who presents to the Emergency Department complaining of sudden onset, gradually decreasing left flank pain ~1 hour prior to evaluation. She states the pain is currently 5/10, and she states the pain was "intense". She notes associate nausea, urinary urgency and diaphoresis. No modifying factors noted. Pt denies abdominal pain, Hx of similar symptoms and vomiting.  Past Medical History:  Diagnosis Date  . Anxiety   . Arrhythmia    Mitral valve regurgitation per pt- OV Dr Chancy Milroy, cardio 6/13- states had eccho and EKG  . Depression   . Family history of adverse reaction to anesthesia    don't know, she was adopted  . Fibromyalgia   . GERD (gastroesophageal reflux disease)   . Headache   . History of hiatal hernia   . History of kidney stones   . Knee pain, right    arthrofibrosis rt knee  . Mitral valve regurgitation   . Pneumonia   . Rosacea     Patient Active Problem List   Diagnosis Date Noted  . Infection   . Cellulitis 01/05/2016  . Patellofemoral arthritis of right knee 01/02/2016  . 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 S/P cholecystectomy 03/07/2015  . RUQ abdominal pain 01/10/2015  . History of anal fissures 11/25/2013  . Diminished ovarian reserve 11/06/2013    Past Surgical History:  Procedure Laterality Date  . CHOLECYSTECTOMY N/A 01/02/2015   Procedure: LAPAROSCOPIC CHOLECYSTECTOMY WITH INTRAOPERATIVE CHOLANGIOGRAM;  Surgeon: Stark Klein, MD;  Location: [facility];  Service: General;   Laterality: N/A;  . COLONOSCOPY  03/15/2011   NORMAL  . CRYOTHERAPY     CERVIX  . FRACTURE SURGERY     2013  . KNEE ARTHROSCOPY  02/03/2012   Procedure: ARTHROSCOPY KNEE;  Surgeon: Johnn Hai, MD;  Location: [facility];  Service: Orthopedics;  Laterality: Right;  Right Knee Arthroscopy with Manipulation Under Anesthesia/Evaluation Under Anesthesia/Debridement  . ORIF PATELLA  07/22/2011   Procedure: OPEN REDUCTION INTERNAL (ORIF) FIXATION PATELLA;  Surgeon: Johnn Hai, MD;  Location: [facility];  Service: Orthopedics;  Laterality: Right;  . PATELLA-FEMORAL ARTHROPLASTY Right 01/02/2016   Procedure: RIGHT KNEE PATELLA-FEMORAL ARTHROPLASTY;  Surgeon: Susa Day, MD;  Location: [facility];  Service: Orthopedics;  Laterality: Right;  Requests 2.5 hrs  . UPPER GI ENDOSCOPY  2016  . WISDOM TOOTH EXTRACTION      OB History    No data available       Home Medications    Prior to Admission medications   Medication Sig Start Date End Date Taking? Authorizing Provider  ALPRAZolam (XANAX) 0.25 MG tablet Take 0.25 mg by mouth 3 (three) times daily as needed for anxiety.  05/30/13   Historical Provider, MD  aspirin EC 325 MG tablet Take 1 tablet (325 mg total) by mouth 2 (two) times daily. 01/02/16   Susa Day, MD  cetirizine (ZYRTEC) 10 MG tablet Take 10 mg by mouth as needed for allergies.    Historical Provider, MD  cyclobenzaprine (FLEXERIL) 10  MG tablet Take 1 tablet (10 mg total) by mouth 3 (three) times daily as needed for muscle spasms. 01/02/16   Cecilie Kicks, PA-C  dexlansoprazole (DEXILANT) 60 MG capsule Take 60 mg by mouth every morning.    Historical Provider, MD  docusate sodium (COLACE) 100 MG capsule Take 1 capsule (100 mg total) by mouth 2 (two) times daily as needed for mild constipation. 01/02/16   Susa Day, MD  flintstones complete (FLINTSTONES) 60 MG chewable tablet Chew 1 tablet by mouth daily.    Historical Provider, MD  LO LOESTRIN FE 1 MG-10 MCG / 10 MCG tablet  Take 1 tablet by mouth every morning.  05/25/13   Historical Provider, MD  LYRICA 50 MG capsule Take 50 mg by mouth 2 (two) times daily. 12/19/15   Historical Provider, MD  Lysine 1000 MG TABS Take 1,000 mg by mouth daily as needed (cold sore/fever blisters).     Historical Provider, MD  ondansetron (ZOFRAN) 4 MG tablet Take 1 tablet (4 mg total) by mouth every 8 (eight) hours as needed for nausea or vomiting. 06/19/14   Lafayette Dragon, MD  polyethylene glycol (MIRALAX / Floria Raveling) packet Take 17 g by mouth daily. 01/02/16   Susa Day, MD  ranitidine (ZANTAC) 150 MG tablet Take 150 mg by mouth 2 (two) times daily as needed. For acid reflux/indigestion 11/25/15   Historical Provider, MD  SUMAtriptan (IMITREX) 50 MG tablet Take 50 mg by mouth daily as needed for migraine. 12/06/15   Historical Provider, MD  traMADol (ULTRAM) 50 MG tablet Take 50 mg by mouth every 12 (twelve) hours as needed.  12/19/15   Historical Provider, MD    Family History Family History  Problem Relation Age of Onset  . Adopted: Yes    Social History Social History  Substance Use Topics  . Smoking status: Former Smoker    Years: 15.00    Types: Cigarettes    Quit date: 07/21/1998  . Smokeless tobacco: Never Used  . Alcohol use 0.0 oz/week     Comment: socially     Allergies   Diazepam   Review of Systems Review of Systems A complete 10 system review of systems was obtained and all systems are negative except as noted in the HPI and PMH.    Physical Exam Updated Vital Signs BP 146/79 (BP Location: Left Arm)   Pulse 88   Temp 98.1 F (36.7 C) (Oral)   Resp 20   SpO2 100%   Physical Exam  Constitutional: She is oriented to person, place, and time. She appears well-developed and well-nourished.  Uncomfortable appearing  HENT:  Head: Normocephalic and atraumatic.  Eyes: EOM are normal. Pupils are equal, round, and reactive to light.  Neck: Normal range of motion. Neck supple. No JVD present.    Cardiovascular: Normal rate, regular rhythm and normal heart sounds.   No murmur heard. Pulmonary/Chest: Effort normal and breath sounds normal. She has no wheezes. She has no rales. She exhibits no tenderness.  Abdominal: Soft. She exhibits no distension and no mass. Bowel sounds are decreased. There is no tenderness.  Musculoskeletal: Normal range of motion. She exhibits no edema.  -straight leg raise  Lymphadenopathy:    She has no cervical adenopathy.  Neurological: She is alert and oriented to person, place, and time. No cranial nerve deficit. She exhibits normal muscle tone. Coordination normal.  Skin: Skin is warm and dry. No rash noted.  Psychiatric: She has a normal mood and affect. Her  behavior is normal. Judgment and thought content normal.  Nursing note and vitals reviewed.    ED Treatments / Results  DIAGNOSTIC STUDIES: Oxygen Saturation is 100% on RA, normal by my interpretation.    COORDINATION OF CARE: 12:36 AM Discussed treatment plan with pt at bedside and pt agreed to plan. Will order fluids, labs, imaging and medications.  Labs (all labs ordered are listed, but only abnormal results are displayed) Labs Reviewed  URINALYSIS, ROUTINE W REFLEX MICROSCOPIC - Abnormal; Notable for the following:       Result Value   APPearance HAZY (*)    Hgb urine dipstick LARGE (*)    Bacteria, UA RARE (*)    Squamous Epithelial / LPF 6-30 (*)    All other components within normal limits  COMPREHENSIVE METABOLIC PANEL - Abnormal; Notable for the following:    Glucose, Bld 124 (*)    All other components within normal limits  LIPASE, BLOOD - Abnormal; Notable for the following:    Lipase 58 (*)    All other components within normal limits  CBC WITH DIFFERENTIAL/PLATELET - Abnormal; Notable for the following:    WBC 13.0 (*)    Neutro Abs 9.7 (*)    All other components within normal limits  PREGNANCY, URINE    Radiology Ct Renal Stone Study  Result Date:  03/12/2016 CLINICAL DATA:  Initial evaluation for acute left flank pain. EXAM: CT ABDOMEN AND PELVIS WITHOUT CONTRAST TECHNIQUE: Multidetector CT imaging of the abdomen and pelvis was performed following the standard protocol without IV contrast. COMPARISON:  Prior CT from 09/09/2015. FINDINGS: Lower chest: Partially visualized lung bases are clear. Hepatobiliary: Visualized liver unremarkable. Gallbladder surgically absent. Mild prominence of the common bile duct like related post cholecystectomy changes. Pancreas: Pancreas normal. Spleen: Spleen within normal limits. Adrenals/Urinary Tract: Adrenal glands are normal. Scattered nonobstructive calculi present within the right kidney, largest of which measures 5 mm in the upper pole. No radiopaque calculi seen along the course of the right renal collecting system. No right-sided hydronephrosis or hydroureter. No nephrolithiasis identified within the left kidney. No radiopaque calculi seen along the course of the left renal collecting system. No left-sided hydronephrosis or hydroureter. Partially distended bladder within normal limits. No layering stones within the bladder lumen. Stomach/Bowel: Visualized stomach within normal limits. No evidence for bowel obstruction. Appendix normal. No acute inflammatory changes seen about the bowels. Vascular/Lymphatic: Intra-abdominal aorta of normal caliber. No significant atherosclerotic disease. No pathologically enlarged intra-abdominal or pelvic lymph nodes. Reproductive: Uterus and ovaries within normal limits. Other: No free air or fluid. Musculoskeletal: No acute osseous abnormality. No worrisome lytic or blastic osseous lesions. IMPRESSION: 1. Nonobstructive right renal nephrolithiasis measuring up to 5 mm. No left-sided renal calculi identified. No ureterolithiasis or evidence for obstructive uropathy. No findings to explain left-sided symptoms. 2. No other acute intra-abdominopelvic process. 3. Status post  cholecystectomy. Electronically Signed   By: Jeannine Boga M.D.   On: 03/12/2016 01:46    Procedures Procedures (including critical care time)  Medications Ordered in ED Medications  ketorolac (TORADOL) 30 MG/ML injection 30 mg (not administered)  ondansetron (ZOFRAN) injection 4 mg (not administered)  morphine 4 MG/ML injection 4 mg (not administered)     Initial Impression / Assessment and Plan / ED Course  I have reviewed the triage vital signs and the nursing notes.  Pertinent labs & imaging results that were available during my care of the patient were reviewed by me and considered in my medical decision making (  see chart for details).  Left flank pain and urinary hesitancy suggestive of urolithiasis. Old records are reviewed, and she did have a CT of abdomen and pelvis in September 2017 which demonstrated a right renal calculus. She is sent for CT renal stone protocol CT scan which shows continued presence of the right renal calculus, and no evidence of hydronephrosis or nephrolithiasis on the left. No urolithiasis is seen. Laboratory workup shows mild elevation of lipase which is felt to be not clinically significant. Urinalysis has too numerous to count RBCs which could be consistent with urolithiasis. I suspect that she had renal colic and had passed the stone prior to arriving in the ED. She had related that pain had improved dramatically shortly before she arrived. She is discharged with prescription for oxycodone-acetaminophen. Return precautions discussed.  Final Clinical Impressions(s) / ED Diagnoses   Final diagnoses:  Left flank pain  Microscopic hematuria  Nephrolithiasis    New Prescriptions New Prescriptions   OXYCODONE-ACETAMINOPHEN (PERCOCET) 5-325 MG TABLET    Take 1 tablet by mouth every 4 (four) hours as needed for moderate pain.   I personally performed the services described in this documentation, which was scribed in my presence. The recorded  information has been reviewed and is accurate.      Delora Fuel, MD [id number]

## 2016-08-08 ENCOUNTER — Emergency Department (HOSPITAL_COMMUNITY)
Admission: EM | Admit: 2016-08-08 | Discharge: 2016-08-08 | Disposition: A | Discharge status: Home / Self Care | Payer: BC Managed Care – PPO | Attending: Emergency Medicine | Admitting: Emergency Medicine

## 2016-08-08 ENCOUNTER — Emergency Department (HOSPITAL_COMMUNITY): Payer: BC Managed Care – PPO

## 2016-08-08 ENCOUNTER — Encounter (HOSPITAL_COMMUNITY): Payer: Self-pay | Admitting: Emergency Medicine

## 2016-08-08 DIAGNOSIS — R1031 Right lower quadrant pain: Secondary | ICD-10-CM

## 2016-08-08 DIAGNOSIS — K5903 Drug induced constipation: Secondary | ICD-10-CM | POA: Diagnosis not present

## 2016-08-08 DIAGNOSIS — T887XXA Unspecified adverse effect of drug or medicament, initial encounter: Secondary | ICD-10-CM | POA: Diagnosis not present

## 2016-08-08 DIAGNOSIS — T404X5A Adverse effect of other synthetic narcotics, initial encounter: Secondary | ICD-10-CM | POA: Insufficient documentation

## 2016-08-08 DIAGNOSIS — Y658 Other specified misadventures during surgical and medical care: Secondary | ICD-10-CM | POA: Diagnosis not present

## 2016-08-08 DIAGNOSIS — N200 Calculus of kidney: Secondary | ICD-10-CM | POA: Insufficient documentation

## 2016-08-08 DIAGNOSIS — R110 Nausea: Secondary | ICD-10-CM

## 2016-08-08 LAB — URINALYSIS, ROUTINE W REFLEX MICROSCOPIC
Bilirubin Urine: NEGATIVE
Glucose, UA: NEGATIVE mg/dL
Ketones, ur: NEGATIVE mg/dL
Leukocytes, UA: NEGATIVE
Nitrite: NEGATIVE
Protein, ur: 100 mg/dL — AB
SPECIFIC GRAVITY, URINE: 1.018 (ref 1.005–1.030)
pH: 5 (ref 5.0–8.0)

## 2016-08-08 LAB — COMPREHENSIVE METABOLIC PANEL
ALBUMIN: 4.2 g/dL (ref 3.5–5.0)
ALK PHOS: 63 U/L (ref 38–126)
ALT: 40 U/L (ref 14–54)
AST: 47 U/L — AB (ref 15–41)
Anion gap: 10 (ref 5–15)
BILIRUBIN TOTAL: 0.6 mg/dL (ref 0.3–1.2)
BUN: 14 mg/dL (ref 6–20)
CALCIUM: 9.7 mg/dL (ref 8.9–10.3)
CO2: 23 mmol/L (ref 22–32)
Chloride: 106 mmol/L (ref 101–111)
Creatinine, Ser: 1 mg/dL (ref 0.44–1.00)
GFR calc Af Amer: >60 mL/min (ref >60)
GFR calc non Af Amer: >60 mL/min (ref >60)
GLUCOSE: 110 mg/dL — AB (ref 65–99)
Potassium: 4.2 mmol/L (ref 3.5–5.1)
Sodium: 139 mmol/L (ref 135–145)
TOTAL PROTEIN: 8 g/dL (ref 6.5–8.1)

## 2016-08-08 LAB — CBC WITH DIFFERENTIAL/PLATELET
BASOS ABS: 0 10*3/uL (ref 0.0–0.1)
BASOS PCT: 0 %
EOS PCT: 1 %
Eosinophils Absolute: 0.1 10*3/uL (ref 0.0–0.7)
HCT: 35.6 % — ABNORMAL LOW (ref 36.0–46.0)
Hemoglobin: 12.2 g/dL (ref 12.0–15.0)
LYMPHS PCT: 17 %
Lymphs Abs: 2 10*3/uL (ref 0.7–4.0)
MCH: 30.7 pg (ref 26.0–34.0)
MCHC: 34.3 g/dL (ref 30.0–36.0)
MCV: 89.7 fL (ref 78.0–100.0)
Monocytes Absolute: 0.4 10*3/uL (ref 0.1–1.0)
Monocytes Relative: 4 %
NEUTROS ABS: 9.4 10*3/uL — AB (ref 1.7–7.7)
Neutrophils Relative %: 78 %
PLATELETS: 256 10*3/uL (ref 150–400)
RBC: 3.97 MIL/uL (ref 3.87–5.11)
RDW: 12.6 % (ref 11.5–15.5)
WBC: 12 10*3/uL — AB (ref 4.0–10.5)

## 2016-08-08 LAB — POC URINE PREG, ED: PREG TEST UR: NEGATIVE

## 2016-08-08 LAB — LIPASE, BLOOD: Lipase: 37 U/L (ref 11–51)

## 2016-08-08 MED ORDER — HYDROMORPHONE HCL 1 MG/ML IJ SOLN
1.0000 mg | Freq: Once | INTRAMUSCULAR | Status: AC
  Administered 2016-08-08: 1 mg via INTRAVENOUS
  Filled 2016-08-08: qty 1

## 2016-08-08 MED ORDER — POLYETHYLENE GLYCOL 3350 17 G PO PACK: 17.0000 g | PACK | Freq: Every day | ORAL | 0 refills | Status: DC

## 2016-08-08 MED ORDER — ONDANSETRON HCL 4 MG/2ML IJ SOLN
4.0000 mg | Freq: Once | INTRAMUSCULAR | Status: AC
  Administered 2016-08-08: 4 mg via INTRAVENOUS
  Filled 2016-08-08: qty 2

## 2016-08-08 MED ORDER — TAMSULOSIN HCL 0.4 MG PO CAPS: 0.4000 mg | ORAL_CAPSULE | Freq: Every day | ORAL | 0 refills | Status: DC

## 2016-08-08 MED ORDER — NAPROXEN 500 MG PO TABS: 500.0000 mg | ORAL_TABLET | Freq: Two times a day (BID) | ORAL | 0 refills | Status: DC

## 2016-08-08 MED ORDER — ONDANSETRON 4 MG PO TBDP: 4.0000 mg | ORAL_TABLET | Freq: Three times a day (TID) | ORAL | 0 refills | Status: DC | PRN

## 2016-08-08 MED ORDER — SODIUM CHLORIDE 0.9 % IV BOLUS (SEPSIS)
1000.0000 mL | Freq: Once | INTRAVENOUS | Status: AC
  Administered 2016-08-08: 1000 mL via INTRAVENOUS

## 2016-08-08 MED ORDER — KETOROLAC TROMETHAMINE 30 MG/ML IJ SOLN
15.0000 mg | Freq: Once | INTRAMUSCULAR | Status: AC
  Administered 2016-08-08: 15 mg via INTRAVENOUS
  Filled 2016-08-08: qty 1

## 2016-08-08 MED ORDER — MORPHINE SULFATE (PF) 4 MG/ML IV SOLN
4.0000 mg | Freq: Once | INTRAVENOUS | Status: AC
  Administered 2016-08-08: 4 mg via INTRAVENOUS
  Filled 2016-08-08: qty 1

## 2016-08-08 NOTE — Discharge Instructions (Signed)
Take naprosyn as directed as needed for pain using your home pain medications for breakthrough pain. Do not drive or operate machinery with pain medication use. Use Zofran as needed for nausea. Use Flomax as directed, as this medication will help you pass the stone. Strain all urine to try to catch the stone when it passes.  You're also constipated which is probably contributing to your pain; see the instructions below regarding this issue. Follow-up with your urologist in the next 3-5 days for recheck of ongoing pain, however for intractable or uncontrollable symptoms at home then return to the [facility] emergency department.    For your constipation: You should start taking miralax as directed, try starting with using it 1-2 times daily until you achieve daily soft stools-- you can use more than 2 doses daily in order to achieve the first bowel movement and then cut back to 1-2 times daily or taper to however often you need to take it in order to continue having the soft daily stools. You also may need an additional over-the-counter stool softener such as colace. Increase the fiber and water intake in your diet. See the information below regarding improving your bowel health.   GETTING TO GOOD BOWEL HEALTH. Irregular bowel habits such as constipation and diarrhea can lead to many problems over time.  Having one soft bowel movement a day is the most important way to prevent further problems.  The anorectal canal is designed to handle stretching and feces to safely manage our ability to get rid of solid waste (feces, poop, stool) out of our body.  BUT, hard constipated stools can act like ripping concrete bricks and diarrhea can be a burning fire to this very sensitive area of our body, causing inflamed hemorrhoids, anal fissures, increasing risk is perirectal abscesses, abdominal pain/bloating, an making irritable bowel worse.     The goal: ONE SOFT BOWEL MOVEMENT A DAY!  To have soft, regular bowel  movements:   Drink at least 8 tall glasses of water a day.    Take plenty of fiber.  Fiber is the undigested part of plant food that passes into the colon, acting s natures broom to encourage bowel motility and movement.  Fiber can absorb and hold large amounts of water. This results in a larger, bulkier stool, which is soft and easier to pass. Work gradually over several weeks up to 6 servings a day of fiber (25g a day even more if needed) in the form of: o Vegetables -- Root (potatoes, carrots, turnips), leafy green (lettuce, salad greens, celery, spinach), or cooked high residue (cabbage, broccoli, etc) o Fruit -- Fresh (unpeeled skin & pulp), Dried (prunes, apricots, cherries, etc ),  or stewed ( applesauce)  o Whole grain breads, pasta, etc (whole wheat)  o Bran cereals   Bulking Agents -- This type of water-retaining fiber generally is easily obtained each day by one of the following:  o Psyllium bran -- The psyllium plant is remarkable because its ground seeds can retain so much water. This product is available as Metamucil, Konsyl, Effersyllium, Per Diem Fiber, or the less expensive generic preparation in drug and health food stores. Although labeled a laxative, it really is not a laxative.  o Methylcellulose -- This is another fiber derived from wood which also retains water. It is available as Citrucel. o Polyethylene Glycol - and artificial fiber commonly called Miralax or Glycolax.  It is helpful for people with gassy or bloated feelings with regular fiber o  Flax Seed - a less gassy fiber than psyllium  No reading or other relaxing activity while on the toilet. If bowel movements take longer than 5 minutes, you are too constipated  AVOID CONSTIPATION.  High fiber and water intake usually takes care of this.  Sometimes a laxative is needed to stimulate more frequent bowel movements, but   Laxatives are not a good long-term solution as it can wear the colon out. o Osmotics (Milk of  Magnesia, Fleets phosphosoda, Magnesium citrate, MiraLax, GoLytely) are safer than  o Stimulants (Senokot, Castor Oil, Dulcolax, Ex Lax)    o Do not take laxatives for more than 7days in a row.   IF SEVERELY CONSTIPATED, try a Bowel Retraining Program: o Do not use laxatives.  o Eat a diet high in roughage, such as bran cereals and leafy vegetables.  o Drink six (6) ounces of prune or apricot juice each morning.  o Eat two (2) large servings of stewed fruit each day.  o Take one (1) heaping tablespoon of a psyllium-based bulking agent twice a day. Use sugar-free sweetener when possible to avoid excessive calories.  o Eat a normal breakfast.  o Set aside 15 minutes after breakfast to sit on the toilet, but do not strain to have a bowel movement.  o If you do not have a bowel movement by the third day, use an enema and repeat the above steps.   Controlling diarrhea o Switch to liquids and simpler foods for a few days to avoid stressing your intestines further. o Avoid dairy products (especially milk & ice cream) for a short time.  The intestines often can lose the ability to digest lactose when stressed. o Avoid foods that cause gassiness or bloating.  Typical foods include beans and other legumes, cabbage, broccoli, and dairy foods.  Every person has some sensitivity to other foods, so listen to our body and avoid those foods that trigger problems for you. o Adding fiber (Citrucel, Metamucil, psyllium, Miralax) gradually can help thicken stools by absorbing excess fluid and retrain the intestines to act more normally.  Slowly increase the dose over a few weeks.  Too much fiber too soon can backfire and cause cramping & bloating. o Probiotics (such as active yogurt, Align, etc) may help repopulate the intestines and colon with normal bacteria and calm down a sensitive digestive tract.  Most studies show it to be of mild help, though, and such products can be costly. o Medicines: - Bismuth  subsalicylate (ex. Kayopectate, Pepto Bismol) every 30 minutes for up to 6 doses can help control diarrhea.  Avoid if pregnant. - Loperamide (Immodium) can slow down diarrhea.  Start with two tablets (59m total) first and then try one tablet every 6 hours.  Avoid if you are having fevers or severe pain.  If you are not better or start feeling worse, stop all medicines and call your doctor for advice o Call your doctor if you are getting worse or not better.  Sometimes further testing (cultures, endoscopy, X-ray studies, bloodwork, etc) may be needed to help diagnose and treat the cause of the diarrhea.  Managing Pain  Pain after surgery or related to activity is often due to strain/injury to muscle, tendon, nerves and/or incisions.  This pain is usually short-term and will improve in a few months.   Many people find it helpful to do the following things TOGETHER to help speed the process of healing and to get back to regular activity more quickly:  1. Avoid heavy physical activity a.  no lifting greater than 20 pounds b. Do not push through the pain.  Listen to your body and avoid positions and maneuvers than reproduce the pain c. Walking is okay as tolerated, but go slowly and stop when getting sore.  d. Remember: If it hurts to do it, then dont do it! 2. Take Anti-inflammatory medication  a. Take with food/snack around the clock for 1-2 weeks i. This helps the muscle and nerve tissues become less irritable and calm down faster b. Choose ONE of the following over-the-counter medications: i. Naproxen 213m tabs (ex. Aleve) 1-2 pills twice a day  ii. Ibuprofen 2020mtabs (ex. Advil, Motrin) 3-4 pills with every meal and just before bedtime iii. Acetaminophen 50045mabs (Tylenol) 1-2 pills with every meal and just before bedtime 3. Use a Heating pad or Ice/Cold Pack a. 4-6 times a day b. May use warm bath/hottub  or showers 4. Try Gentle Massage and/or Stretching  a. at the area of pain  many times a day b. stop if you feel pain - do not overdo it  Try these steps together to help you body heal faster and avoid making things get worse.  Doing just one of these things may not be enough.    If you are not getting better after two weeks or are noticing you are getting worse, contact our office for further advice; we may need to re-evaluate you & see what other things we can do to help.

## 2016-08-08 NOTE — ED Provider Notes (Signed)
[facility] DEPT Provider Note   CSN: [id number] Arrival date & time: 08/08/16  1404     History   Chief Complaint Chief Complaint  Patient presents with  . Abdominal Pain  . Hip Pain    HPI Sarah Cobb is a 46 y.o. female with a PMHx of nephrolithiasis, fibromyalgia, GERD, and other medical problems listed below, with PSHx of cholecystectomy and lithotripsy (most recently on 08/05/16 by Dr. Ky Barban at [facility]), who presents to the ED with complaints of RUQ pain that began about 1 hour prior to arrival and radiates to the right flank. She states this feels similar to when she's had kidney stones in the past. She just had lithotripsy done on 08/05/16 for a 5 mm right-sided kidney stone, states that she was somewhat sore after that but had no significant pain until one hour ago. She describes the pain is 9/10 constant waxing and waning sharp RLQ pain that radiates to the right flank, worse with movement, and with no treatments tried prior to arrival. Associated symptoms include chills and nausea. She states she's been on tramadol for pain over the last few days, and therefore has had some constipation, not having had a BM in 3 days. She also states that since the lithotripsy, she's had some hematuria, but it's been gradually improving.   She denies fevers, chills, CP, SOB, vomiting, diarrhea, obstipation, rectal pain, melena or hematochezia with last BM, dysuria, urinary frequency/urgency, malodorous urine, vaginal bleeding/discharge, myalgias, arthralgias, numbness, tingling, focal weakness, or any other complaints at this time. Denies recent travel, sick contacts, suspicious food intake, EtOH use, or frequent NSAID use.    The history is provided by the patient and medical records. No language interpreter was used.  Abdominal Pain   This is a new problem. The current episode started 1 to 2 hours ago. The problem occurs constantly. Progression since onset: waxing/waning. The pain is  associated with a previous surgery. The pain is located in the RLQ (radiating to R flank). The quality of the pain is sharp. The pain is at a severity of 9/10. The pain is moderate. Associated symptoms include nausea, constipation (no BM in 3 days due to tramadol) and hematuria (improving since lithotripsy). Pertinent negatives include fever, diarrhea, flatus, hematochezia, melena, vomiting, dysuria, frequency, arthralgias and myalgias. The symptoms are aggravated by activity. Nothing relieves the symptoms. Past workup includes surgery. Her past medical history is significant for GERD. Past medical history comments: nephrolithiasis.    Past Medical History:  Diagnosis Date  . Anxiety   . Arrhythmia    Mitral valve regurgitation per pt- OV Dr Chancy Milroy, cardio 6/13- states had eccho and EKG  . Depression   . Family history of adverse reaction to anesthesia    don't know, she was adopted  . Fibromyalgia   . GERD (gastroesophageal reflux disease)   . Headache   . History of hiatal hernia   . History of kidney stones   . Knee pain, right    arthrofibrosis rt knee  . Mitral valve regurgitation   . Pneumonia   . Rosacea     Patient Active Problem List   Diagnosis Date Noted  . Infection   . Cellulitis 01/05/2016  . Patellofemoral arthritis of right knee 01/02/2016  . 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 S/P cholecystectomy 03/07/2015  . RUQ abdominal pain 01/10/2015  . History of anal fissures 11/25/2013  . Diminished ovarian reserve 11/06/2013    Past Surgical History:  Procedure Laterality Date  .  CHOLECYSTECTOMY N/A 01/02/2015   Procedure: LAPAROSCOPIC CHOLECYSTECTOMY WITH INTRAOPERATIVE CHOLANGIOGRAM;  Surgeon: Stark Klein, MD;  Location: [place];  Service: General;  Laterality: N/A;  . COLONOSCOPY  03/15/2011   NORMAL  . CRYOTHERAPY     CERVIX  . FRACTURE SURGERY     2013  . KNEE ARTHROSCOPY  02/03/2012   Procedure: ARTHROSCOPY KNEE;  Surgeon: Johnn Hai, MD;  Location: [facility];   Service: Orthopedics;  Laterality: Right;  Right Knee Arthroscopy with Manipulation Under Anesthesia/Evaluation Under Anesthesia/Debridement  . LITHOTRIPSY    . ORIF PATELLA  07/22/2011   Procedure: OPEN REDUCTION INTERNAL (ORIF) FIXATION PATELLA;  Surgeon: Johnn Hai, MD;  Location: [facility];  Service: Orthopedics;  Laterality: Right;  . PATELLA-FEMORAL ARTHROPLASTY Right 01/02/2016   Procedure: RIGHT KNEE PATELLA-FEMORAL ARTHROPLASTY;  Surgeon: Susa Day, MD;  Location: [facility];  Service: Orthopedics;  Laterality: Right;  Requests 2.5 hrs  . UPPER GI ENDOSCOPY  2016  . WISDOM TOOTH EXTRACTION      OB History    No data available       Home Medications    Prior to Admission medications   Medication Sig Start Date End Date Taking? Authorizing Provider  ALPRAZolam (XANAX) 0.25 MG tablet Take 0.25 mg by mouth 3 (three) times daily as needed for anxiety.  05/30/13   [provider]  aspirin EC 325 MG tablet Take 1 tablet (325 mg total) by mouth 2 (two) times daily. 01/02/16   Susa Day, MD  cetirizine (ZYRTEC) 10 MG tablet Take 10 mg by mouth as needed for allergies.    [provider]  cyclobenzaprine (FLEXERIL) 10 MG tablet Take 1 tablet (10 mg total) by mouth 3 (three) times daily as needed for muscle spasms. 01/02/16   Cecilie Kicks, PA-C  dexlansoprazole (DEXILANT) 60 MG capsule Take 60 mg by mouth every morning.    [provider]  docusate sodium (COLACE) 100 MG capsule Take 1 capsule (100 mg total) by mouth 2 (two) times daily as needed for mild constipation. 01/02/16   Susa Day, MD  flintstones complete (FLINTSTONES) 60 MG chewable tablet Chew 1 tablet by mouth daily.    [provider]  LO LOESTRIN FE 1 MG-10 MCG / 10 MCG tablet Take 1 tablet by mouth every morning.  05/25/13   [provider]  LYRICA 50 MG capsule Take 50 mg by mouth 2 (two) times daily. 12/19/15   [provider]  Lysine 1000 MG TABS Take  1,000 mg by mouth daily as needed (cold sore/fever blisters).     [provider]  ondansetron (ZOFRAN) 4 MG tablet Take 1 tablet (4 mg total) by mouth every 8 (eight) hours as needed for nausea or vomiting. 06/19/14   Lafayette Dragon, MD  oxyCODONE-acetaminophen (PERCOCET) 5-325 MG tablet Take 1 tablet by mouth every 4 (four) hours as needed for moderate pain. 0/5/39   Delora Fuel, MD  polyethylene glycol [facility] / Floria Raveling) packet Take 17 g by mouth daily. 01/02/16   Susa Day, MD  ranitidine (ZANTAC) 150 MG tablet Take 150 mg by mouth 2 (two) times daily as needed. For acid reflux/indigestion 11/25/15   [provider]  SUMAtriptan (IMITREX) 50 MG tablet Take 50 mg by mouth daily as needed for migraine. 12/06/15   [provider]  traMADol (ULTRAM) 50 MG tablet Take 50 mg by mouth every 12 (twelve) hours as needed.  12/19/15   [provider]    Family History  Family History  Problem Relation Age of Onset  . Adopted: Yes    Social History Social History  Substance Use Topics  . Smoking status: Former Smoker    Years: 15.00    Types: Cigarettes    Quit date: 07/21/1998  . Smokeless tobacco: Never Used  . Alcohol use 0.0 oz/week     Comment: socially     Allergies   Diazepam   Review of Systems Review of Systems  Constitutional: Positive for chills. Negative for fever.  Respiratory: Negative for shortness of breath.   Cardiovascular: Negative for chest pain.  Gastrointestinal: Positive for abdominal pain, constipation (no BM in 3 days due to tramadol) and nausea. Negative for anal bleeding, blood in stool, diarrhea, flatus, hematochezia, melena, rectal pain and vomiting.  Genitourinary: Positive for hematuria (improving since lithotripsy). Negative for dysuria, frequency, urgency, vaginal bleeding and vaginal discharge.       No malodorous urine  Musculoskeletal: Negative for arthralgias and myalgias.  Skin: Negative for color change.    Allergic/Immunologic: Negative for immunocompromised state.  Neurological: Negative for weakness and numbness.  Psychiatric/Behavioral: Negative for confusion.   All other systems reviewed and are negative for acute change except as noted in the HPI.    Physical Exam Updated Vital Signs BP (!) 145/94 (BP Location: Left Arm)   Pulse 99   Temp (!) 97.5 F (36.4 C) (Oral)   Resp 20   SpO2 100%   Physical Exam  Constitutional: She is oriented to person, place, and time. Vital signs are normal. She appears well-developed and well-nourished.  Non-toxic appearance. No distress.  Afebrile, nontoxic, NAD but appears uncomfortable at times  HENT:  Head: Normocephalic and atraumatic.  Mouth/Throat: Oropharynx is clear and moist and mucous membranes are normal.  Eyes: Conjunctivae and EOM are normal. Right eye exhibits no discharge. Left eye exhibits no discharge.  Neck: Normal range of motion. Neck supple.  Cardiovascular: Normal rate, regular rhythm, normal heart sounds and intact distal pulses.  Exam reveals no gallop and no friction rub.   No murmur heard. Pulmonary/Chest: Effort normal and breath sounds normal. No respiratory distress. She has no decreased breath sounds. She has no wheezes. She has no rhonchi. She has no rales.  Abdominal: Soft. Normal appearance and bowel sounds are normal. She exhibits no distension. There is tenderness in the right upper quadrant and right lower quadrant. There is no rigidity, no rebound, no guarding, no CVA tenderness, no tenderness at McBurney's point and negative Murphy's sign.    Soft, nondistended, +BS throughout, with mild R lateral abd TTP tracking towards the R flank area, no r/g/r, neg murphy's, neg mcburney's, no CVA TTP   Musculoskeletal: Normal range of motion.  Neurological: She is alert and oriented to person, place, and time. She has normal strength. No sensory deficit.  Skin: Skin is warm, dry and intact. No rash noted.  Psychiatric:  She has a normal mood and affect.  Nursing note and vitals reviewed.    ED Treatments / Results  Labs (all labs ordered are listed, but only abnormal results are displayed) Labs Reviewed  COMPREHENSIVE METABOLIC PANEL - Abnormal; Notable for the following:       Result Value   Glucose, Bld 110 (*)    AST 47 (*)    All other components within normal limits  URINALYSIS, ROUTINE W REFLEX MICROSCOPIC - Abnormal; Notable for the following:    APPearance CLOUDY (*)    Hgb urine dipstick LARGE (*)  Protein, ur 100 (*)    Bacteria, UA FEW (*)    Squamous Epithelial / LPF 6-30 (*)    All other components within normal limits  CBC WITH DIFFERENTIAL/PLATELET - Abnormal; Notable for the following:    WBC 12.0 (*)    HCT 35.6 (*)    Neutro Abs 9.4 (*)    All other components within normal limits  URINE CULTURE  LIPASE, BLOOD  CBC WITH DIFFERENTIAL/PLATELET  POC URINE PREG, ED    EKG  EKG Interpretation None       Radiology Ct Renal Stone Study  Result Date: 08/08/2016 CLINICAL DATA:  Severe right flank and lower quadrant pain beginning this morning. Nephrolithiasis. EXAM: CT ABDOMEN AND PELVIS WITHOUT CONTRAST TECHNIQUE: Multidetector CT imaging of the abdomen and pelvis was performed following the standard protocol without IV contrast. COMPARISON:  03/12/2016 FINDINGS: Lower chest: No acute findings. Hepatobiliary: No masses visualized on this unenhanced exam. Prior cholecystectomy. No evidence of biliary dilatation. The Pancreas: No mass or inflammatory process visualized on this unenhanced exam. Spleen:  Within normal limits in size. Adrenals/Urinary tract: Mild right hydroureteronephrosis is seen due to a 3 mm calculus in the mid pelvic portion of the right ureter. The no evidence of left-sided ureteral calculi or hydronephrosis. Stomach/Bowel: No evidence of obstruction, inflammatory process, or abnormal fluid collections. Normal appendix visualized. Vascular/Lymphatic: No  pathologically enlarged lymph nodes identified. No evidence of abdominal aortic aneurysm. Reproductive: Stable 1.5 cm subserosal fibroid in the uterine fundus. Adnexal regions are unremarkable. Other:  None. Musculoskeletal:  No suspicious bone lesions identified. IMPRESSION: Mild right hydroureteronephrosis due to 3 mm calculus in mid pelvic portion of right ureter. Stable small uterine fibroid. Electronically Signed   By: Earle Gell M.D.   On: 08/08/2016 15:47    Procedures Procedures (including critical care time)  Medications Ordered in ED Medications  ketorolac (TORADOL) 30 MG/ML injection 15 mg (15 mg Intravenous Given 08/08/16 1513)  ondansetron (ZOFRAN) injection 4 mg (4 mg Intravenous Given 08/08/16 1513)  morphine 4 MG/ML injection 4 mg (4 mg Intravenous Given 08/08/16 1513)  sodium chloride 0.9 % bolus 1,000 mL (0 mLs Intravenous Stopped 08/08/16 1724)  HYDROmorphone (DILAUDID) injection 1 mg (1 mg Intravenous Given 08/08/16 1741)     Initial Impression / Assessment and Plan / ED Course  I have reviewed the triage vital signs and the nursing notes.  Pertinent labs & imaging results that were available during my care of the patient were reviewed by me and considered in my medical decision making (see chart for details).     46 y.o. female here with RLQ/R flank pain that began ~1hr prior to arrival, she had lithotripsy done 3 days ago for a 60m R kidney stone and had some mild soreness but the pain suddenly worsened today. States it feels like prior stones. On exam, appears uncomfortable, mild R lateral abd TTP tracking towards the R flank area, no CVA TTP, nonperitoneal, no mcburney's point TTP. Given that her stone was 552mto start with, and had lithotripsy done on it, the fragments of stone could be too small to see on a KUB, so will opt for CT renal study for better evaluation. Will get labs as well. Will give pain/nausea meds and fluids, and reassess shortly  6:42 PM CBC w/diff with  marginally elevated WBC at 12.0 but likely stress demargination response. CMP essentially unremarkable. Lipase WNL. U/A with neg nitrite/leuks, TNTC RBCs, 0-5 WBCs, but 6-30 squamous so fairly contaminated, and few  bacteria; doubt UTI, will send for culture but these findings are likely either from contamination vs from the stone rather than infection. Upreg neg. CT renal showing 37m R ureterolithiasis; also shows some moderate stool burden on the R side, which could be contributing to her symptoms. Pt feeling much better after meds. Will send home with miralax, advised high fiber/water intake, OTC colace if needed, and discussed constipation guidelines; also send home with flomax, zofran, and naprosyn, use of home urine strainer discussed; advised use of tylenol and home pain meds as needed for additional pain relief. [place] got Tramadol filled 08/05/16 for 20 tablets, but also has hydromorphine rx's fairly regularly by another provider, therefore no narcotic rx was given today. Discussed f/up with her urologist in the next 3-5 days for recheck of symptoms and ongoing management. I explained the diagnosis and have given explicit precautions to return to the ER including for any other new or worsening symptoms. The patient understands and accepts the medical plan as it's been dictated and I have answered their questions. Discharge instructions concerning home care and prescriptions have been given. The patient is STABLE and is discharged to home in good condition.    Final Clinical Impressions(s) / ED Diagnoses   Final diagnoses:  Acute right lower quadrant pain  Nephrolithiasis  Nausea  Drug-induced constipation    New Prescriptions New Prescriptions   NAPROXEN (NAPROSYN) 500 MG TABLET    Take 1 tablet (500 mg total) by mouth 2 (two) times daily with a meal.   ONDANSETRON (ZOFRAN ODT) 4 MG DISINTEGRATING TABLET    Take 1 tablet (4 mg total) by mouth every 8 (eight) hours as needed  for nausea or vomiting.   POLYETHYLENE GLYCOL (MIRALAX / GLYCOLAX) PACKET    Take 17 g by mouth daily.   TAMSULOSIN (FLOMAX) 0.4 MG CAPS CAPSULE    Take 1 capsule (0.4 mg total) by mouth daily after supper. TAKE UNTIL STONE PASSES, THEN [address] [place] MD 08/09/16 [phone]

## 2016-08-08 NOTE — ED Triage Notes (Signed)
Pt s/p lithotripsy 08/05/2016. Pt with severe pain to R lower abdomen / pelvis and radiating up to R flank that began this morning.

## 2016-08-10 LAB — URINE CULTURE

## 2016-09-08 ENCOUNTER — Encounter (HOSPITAL_COMMUNITY): Payer: Self-pay | Admitting: *Deleted

## 2016-09-08 DIAGNOSIS — R197 Diarrhea, unspecified: Secondary | ICD-10-CM | POA: Diagnosis not present

## 2016-09-08 DIAGNOSIS — R109 Unspecified abdominal pain: Secondary | ICD-10-CM | POA: Diagnosis not present

## 2016-09-08 DIAGNOSIS — Z79899 Other long term (current) drug therapy: Secondary | ICD-10-CM | POA: Insufficient documentation

## 2016-09-08 DIAGNOSIS — Z87891 Personal history of nicotine dependence: Secondary | ICD-10-CM | POA: Diagnosis not present

## 2016-09-08 LAB — URINALYSIS, ROUTINE W REFLEX MICROSCOPIC
BILIRUBIN URINE: NEGATIVE
GLUCOSE, UA: NEGATIVE mg/dL
KETONES UR: NEGATIVE mg/dL
LEUKOCYTES UA: NEGATIVE
NITRITE: NEGATIVE
PH: 5 (ref 5.0–8.0)
Protein, ur: 30 mg/dL — AB
Specific Gravity, Urine: 1.029 (ref 1.005–1.030)

## 2016-09-08 LAB — POC URINE PREG, ED: Preg Test, Ur: NEGATIVE

## 2016-09-08 NOTE — ED Triage Notes (Signed)
Pt complains of left flank pain radiating to her let abdomen and diarhea since this morning. Pt denies urinary symptoms. Pt denies nausea/emesis.

## 2016-09-09 ENCOUNTER — Emergency Department (HOSPITAL_COMMUNITY)
Admission: EM | Admit: 2016-09-09 | Discharge: 2016-09-09 | Disposition: A | Discharge status: Home / Self Care | Payer: BC Managed Care – PPO | Attending: Emergency Medicine | Admitting: Emergency Medicine

## 2016-09-09 ENCOUNTER — Emergency Department (HOSPITAL_COMMUNITY): Payer: BC Managed Care – PPO

## 2016-09-09 DIAGNOSIS — R197 Diarrhea, unspecified: Secondary | ICD-10-CM

## 2016-09-09 DIAGNOSIS — R109 Unspecified abdominal pain: Secondary | ICD-10-CM

## 2016-09-09 LAB — CBC WITH DIFFERENTIAL/PLATELET
Basophils Absolute: 0 10*3/uL (ref 0.0–0.1)
Basophils Relative: 0 %
Eosinophils Absolute: 0.2 10*3/uL (ref 0.0–0.7)
Eosinophils Relative: 1 %
HCT: 38.4 % (ref 36.0–46.0)
Hemoglobin: 13.3 g/dL (ref 12.0–15.0)
Lymphocytes Relative: 19 %
Lymphs Abs: 2.1 10*3/uL (ref 0.7–4.0)
MCH: 31.1 pg (ref 26.0–34.0)
MCHC: 34.6 g/dL (ref 30.0–36.0)
MCV: 89.7 fL (ref 78.0–100.0)
Monocytes Absolute: 0.7 10*3/uL (ref 0.1–1.0)
Monocytes Relative: 7 %
Neutro Abs: 8.1 10*3/uL — ABNORMAL HIGH (ref 1.7–7.7)
Neutrophils Relative %: 73 %
Platelets: 245 10*3/uL (ref 150–400)
RBC: 4.28 MIL/uL (ref 3.87–5.11)
RDW: 13.1 % (ref 11.5–15.5)
WBC: 11.1 10*3/uL — ABNORMAL HIGH (ref 4.0–10.5)

## 2016-09-09 LAB — COMPREHENSIVE METABOLIC PANEL
ALT: 32 U/L (ref 14–54)
AST: 28 U/L (ref 15–41)
Albumin: 3.9 g/dL (ref 3.5–5.0)
Alkaline Phosphatase: 66 U/L (ref 38–126)
Anion gap: 10 (ref 5–15)
BUN: 11 mg/dL (ref 6–20)
CO2: 23 mmol/L (ref 22–32)
Calcium: 9.3 mg/dL (ref 8.9–10.3)
Chloride: 109 mmol/L (ref 101–111)
Creatinine, Ser: 0.9 mg/dL (ref 0.44–1.00)
GFR calc Af Amer: >60 mL/min (ref >60)
GFR calc non Af Amer: >60 mL/min (ref >60)
Glucose, Bld: 126 mg/dL — ABNORMAL HIGH (ref 65–99)
Potassium: 3.3 mmol/L — ABNORMAL LOW (ref 3.5–5.1)
Sodium: 142 mmol/L (ref 135–145)
Total Bilirubin: 0.4 mg/dL (ref 0.3–1.2)
Total Protein: 7.3 g/dL (ref 6.5–8.1)

## 2016-09-09 LAB — LIPASE, BLOOD: Lipase: 38 U/L (ref 11–51)

## 2016-09-09 MED ORDER — KETOROLAC TROMETHAMINE 30 MG/ML IJ SOLN
30.0000 mg | Freq: Once | INTRAMUSCULAR | Status: DC
  Filled 2016-09-09: qty 1

## 2016-09-09 MED ORDER — TRAMADOL HCL 50 MG PO TABS: 50.0000 mg | ORAL_TABLET | Freq: Four times a day (QID) | ORAL | 0 refills | Status: AC | PRN

## 2016-09-09 NOTE — ED Provider Notes (Signed)
[facility] DEPT Provider Note   CSN: [id number] Arrival date & time: 09/08/16  1710     History   Chief Complaint Chief Complaint  Patient presents with  . Flank Pain  . Abdominal Pain    HPI Sarah Cobb is a 46 y.o. female.  HPI Patient presents to the emergency department with left flank pain that radiates to the left abdomen.  She states she has also had some diarrhea since this morning.  She states the pain started yesterday evening.  Patient states that nothing seems make the condition better.  Palpation makes the pain worse.  She states she did not take any medications prior to arrival. The patient denies chest pain, shortness of breath, headache,blurred vision, neck pain, fever, cough, weakness, numbness, dizziness, anorexia, edema,nausea, vomiting,  rash, back pain, dysuria, hematemesis, bloody stool, near syncope, or syncope. Past Medical History:  Diagnosis Date  . Anxiety   . Arrhythmia    Mitral valve regurgitation per pt- OV Dr Chancy Milroy, cardio 6/13- states had eccho and EKG  . Depression   . Family history of adverse reaction to anesthesia    don't know, she was adopted  . Fibromyalgia   . GERD (gastroesophageal reflux disease)   . Headache   . History of hiatal hernia   . History of kidney stones   . Knee pain, right    arthrofibrosis rt knee  . Mitral valve regurgitation   . Pneumonia   . Rosacea     Patient Active Problem List   Diagnosis Date Noted  . Infection   . Cellulitis 01/05/2016  . Patellofemoral arthritis of right knee 01/02/2016  . 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 S/P cholecystectomy 03/07/2015  . RUQ abdominal pain 01/10/2015  . History of anal fissures 11/25/2013  . Diminished ovarian reserve 11/06/2013    Past Surgical History:  Procedure Laterality Date  . CHOLECYSTECTOMY N/A 01/02/2015   Procedure: LAPAROSCOPIC CHOLECYSTECTOMY WITH INTRAOPERATIVE CHOLANGIOGRAM;  Surgeon: Stark Klein, MD;  Location: [facility];  Service: General;   Laterality: N/A;  . COLONOSCOPY  03/15/2011   NORMAL  . CRYOTHERAPY     CERVIX  . FRACTURE SURGERY     2013  . KNEE ARTHROSCOPY  02/03/2012   Procedure: ARTHROSCOPY KNEE;  Surgeon: Johnn Hai, MD;  Location: [facility];  Service: Orthopedics;  Laterality: Right;  Right Knee Arthroscopy with Manipulation Under Anesthesia/Evaluation Under Anesthesia/Debridement  . LITHOTRIPSY    . ORIF PATELLA  07/22/2011   Procedure: OPEN REDUCTION INTERNAL (ORIF) FIXATION PATELLA;  Surgeon: Johnn Hai, MD;  Location: [facility];  Service: Orthopedics;  Laterality: Right;  . PATELLA-FEMORAL ARTHROPLASTY Right 01/02/2016   Procedure: RIGHT KNEE PATELLA-FEMORAL ARTHROPLASTY;  Surgeon: Susa Day, MD;  Location: [facility];  Service: Orthopedics;  Laterality: Right;  Requests 2.5 hrs  . UPPER GI ENDOSCOPY  2016  . WISDOM TOOTH EXTRACTION      OB History    No data available       Home Medications    Prior to Admission medications   Medication Sig Start Date End Date Taking? Authorizing Provider  ALPRAZolam (XANAX) 0.25 MG tablet Take 0.25 mg by mouth 3 (three) times daily as needed for anxiety.  05/30/13  Yes [provider]  cyclobenzaprine (FLEXERIL) 10 MG tablet Take 1 tablet (10 mg total) by mouth 3 (three) times daily as needed for muscle spasms. 01/02/16  Yes Lacie Draft M, PA-C  dexlansoprazole (DEXILANT) 60 MG capsule Take 60 mg by mouth every morning.  Yes [provider]  LO LOESTRIN FE 1 MG-10 MCG / 10 MCG tablet Take 1 tablet by mouth every morning.  05/25/13  Yes [provider]  LYRICA 50 MG capsule Take 50 mg by mouth daily.  12/19/15  Yes [provider]  OVER THE COUNTER MEDICATION Take 1 capsule by mouth daily.   Yes [provider]  traMADol (ULTRAM) 50 MG tablet Take 50 mg by mouth every 12 (twelve) hours as needed for moderate pain.  12/19/15  Yes [provider]  aspirin EC 325 MG tablet Take 1 tablet (325 mg total) by mouth 2  (two) times daily. Patient not taking: Reported on 08/08/2016 01/02/16   Susa Day, MD  docusate sodium (COLACE) 100 MG capsule Take 1 capsule (100 mg total) by mouth 2 (two) times daily as needed for mild constipation. Patient not taking: Reported on 08/08/2016 01/02/16   Susa Day, MD  naproxen (NAPROSYN) 500 MG tablet Take 1 tablet (500 mg total) by mouth 2 (two) times daily with a meal. Patient not taking: Reported on 09/09/2016 08/08/16   Street, [place], PA-C  ondansetron (ZOFRAN ODT) 4 MG disintegrating tablet Take 1 tablet (4 mg total) by mouth every 8 (eight) hours as needed for nausea or vomiting. Patient not taking: Reported on 09/09/2016 08/08/16   Street, [place], PA-C  ondansetron (ZOFRAN) 4 MG tablet Take 1 tablet (4 mg total) by mouth every 8 (eight) hours as needed for nausea or vomiting. Patient not taking: Reported on 08/08/2016 06/19/14   Lafayette Dragon, MD  oxyCODONE-acetaminophen (PERCOCET) 5-325 MG tablet Take 1 tablet by mouth every 4 (four) hours as needed for moderate pain. Patient not taking: Reported on 08/05/4479 08/08/61   Delora Fuel, MD  polyethylene glycol [facility] / Floria Raveling) packet Take 17 g by mouth daily. Patient not taking: Reported on 08/08/2016 01/02/16   Susa Day, MD  polyethylene glycol [facility] / Floria Raveling) packet Take 17 g by mouth daily. Patient not taking: Reported on 09/09/2016 08/08/16   Street, [place], PA-C  tamsulosin (FLOMAX) 0.4 MG CAPS capsule Take 1 capsule (0.4 mg total) by mouth daily after supper. TAKE UNTIL STONE PASSES, THEN STOP TAKING Patient not taking: Reported on 09/09/2016 08/08/16   Street, [place], PA-C    Family History Family History  Problem Relation Age of Onset  . Adopted: Yes    Social History Social History  Substance Use Topics  . Smoking status: Former Smoker    Years: 15.00    Types: Cigarettes    Quit date: 07/21/1998  . Smokeless tobacco: Never Used  . Alcohol use 0.0 oz/week     Comment: socially      Allergies   Diazepam   Review of Systems Review of Systems All other systems negative except as documented in the HPI. All pertinent positives and negatives as reviewed in the HPI.  Physical Exam Updated Vital Signs BP (!) 147/97 (BP Location: Right Arm)   Pulse 99   Temp 98.3 F (36.8 C) (Oral)   Resp 16   Ht 5' (1.524 m)   Wt 73 kg (161 lb)   SpO2 96%   BMI 31.44 kg/m   Physical Exam  Constitutional: She is oriented to person, place, and time. She appears well-developed and well-nourished. No distress.  HENT:  Head: Normocephalic and atraumatic.  Mouth/Throat: Oropharynx is clear and moist.  Eyes: Pupils are equal, round, and reactive to light.  Neck: Normal range of motion. Neck supple.  Cardiovascular: Normal rate, regular rhythm  and normal heart sounds.  Exam reveals no gallop and no friction rub.   No murmur heard. Pulmonary/Chest: Effort normal and breath sounds normal. No respiratory distress. She has no wheezes.  Abdominal: Soft. Bowel sounds are normal. She exhibits no distension. There is no tenderness. There is no rigidity, no rebound and no guarding.    Neurological: She is alert and oriented to person, place, and time. She exhibits normal muscle tone. Coordination normal.  Skin: Skin is warm and dry. Capillary refill takes less than 2 seconds. No rash noted. No erythema.  Psychiatric: She has a normal mood and affect. Her behavior is normal.  Nursing note and vitals reviewed.    ED Treatments / Results  Labs (all labs ordered are listed, but only abnormal results are displayed) Labs Reviewed  URINALYSIS, ROUTINE W REFLEX MICROSCOPIC - Abnormal; Notable for the following:       Result Value   APPearance HAZY (*)    Hgb urine dipstick LARGE (*)    Protein, ur 30 (*)    Bacteria, UA RARE (*)    Squamous Epithelial / LPF 6-30 (*)    All other components within normal limits  COMPREHENSIVE METABOLIC PANEL - Abnormal; Notable for the following:     Potassium 3.3 (*)    Glucose, Bld 126 (*)    All other components within normal limits  CBC WITH DIFFERENTIAL/PLATELET - Abnormal; Notable for the following:    WBC 11.1 (*)    Neutro Abs 8.1 (*)    All other components within normal limits  LIPASE, BLOOD  POC URINE PREG, ED    EKG  EKG Interpretation None       Radiology Ct Renal Stone Study  Result Date: 09/09/2016 CLINICAL DATA:  46 y/o F; left flank pain radiating to the left groin for 24 hours. Nausea and diarrhea. EXAM: CT ABDOMEN AND PELVIS WITHOUT CONTRAST TECHNIQUE: Multidetector CT imaging of the abdomen and pelvis was performed following the standard protocol without IV contrast. COMPARISON:  08/08/2016 CT abdomen and pelvis. FINDINGS: Lower chest: No acute abnormality. Hepatobiliary: No focal liver abnormality is seen. Status post cholecystectomy. No biliary dilatation. Pancreas: Unremarkable. No pancreatic ductal dilatation or surrounding inflammatory changes. Spleen: Normal in size without focal abnormality. Adrenals/Urinary Tract: Adrenal glands are unremarkable. Right kidney punctate interpolar nonobstructive nephrolithiasis. Kidneys are otherwise normal, without renal calculi, focal lesion, or hydronephrosis. Bladder is unremarkable. Stomach/Bowel: Stomach is within normal limits. Appendix appears normal. No evidence of bowel wall thickening, distention, or inflammatory changes. Vascular/Lymphatic: No significant vascular findings are present. No enlarged abdominal or pelvic lymph nodes. 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 No adnexal lesion identified. Other: No abdominal wall hernia or abnormality. No abdominopelvic ascites. Musculoskeletal: No acute or significant osseous findings. IMPRESSION: 1. No hydronephrosis or obstructive uropathy. 2. Right kidney punctate interpolar nonobstructive nephrolithiasis. 3. Stable small uterine myoma.  Cholecystectomy. Electronically Signed   By: Kristine Garbe M.D.    On: 09/09/2016 01:28    Procedures Procedures (including critical care time)  Medications Ordered in ED Medications  ketorolac (TORADOL) 30 MG/ML injection 30 mg (not administered)     Initial Impression / Assessment and Plan / ED Course  I have reviewed the triage vital signs and the nursing notes.  Pertinent labs & imaging results that were available during my care of the patient were reviewed by me and considered in my medical decision making (see chart for details).     Patient will be treated for a GI  illness.  Due to the fact she does have diarrhea.  I will have her follow-up with her primary doctor, told to return here as needed.  Patient agrees the plan and all questions were answered.  The CT scan and laboratory testing does not show any significant abnormalities.  Patient has been stable here in the emergency department.  She has had no vomiting or diarrhea  Final Clinical Impressions(s) / ED Diagnoses   Final diagnoses:  None    New Prescriptions New Prescriptions   No medications on file     Dalia Heading, PA-C 09/09/16 [id number]    Varney Biles, MD 09/09/16 2220

## 2016-09-09 NOTE — Discharge Instructions (Signed)
Return here as needed.  Follow-up with your primary doctor, increase her fluid intake and rest as much as possible.  The CT scan did not show any significant abnormalities.  Your laboratory testing did not show any major abnormalities at this time.

## 2016-12-23 NOTE — Progress Notes (Signed)
Office Visit Note  Patient: Sarah Cobb             Date of Birth: 09-20-1970           MRN: [id number]             PCP: Selinda Orion Referring: Selinda Orion Visit Date: 12/29/2016 Occupation: @GUAROCC @    Subjective:  Pain in multiple joints   History of Present Illness: Sarah Cobb is a 46 y.o. female with a history of osteoarthritis and patellar fracture of right knee with a partial knee replacement.  Patient reports having a partial knee replacement in December 2017 through [facility].  Patient states she was seeing a pain management specialist but their office moved so she has not been back since October 2017.  She states she has made a new appointment with a pain clinic that is scheduled for 01/11/17.  She states she continues to have low back pain, bilateral shoulder pain, wrist pain, and hand pain.  She denies any joint swelling.  She states her fibromyalgia has been flaring and causing generalized pain that she cannot differentiate from her osteoarthritis pain.  She continues to take Flexeril and Tramadol.  She has insomnia and fatigue.    Activities of Daily Living:  Patient reports morning stiffness for hour.   Patient Denies nocturnal pain.  Difficulty dressing/grooming: Denies Difficulty climbing stairs: Reports Difficulty getting out of chair: Reports Difficulty using hands for taps, buttons, cutlery, and/or writing: Denies   Review of Systems  Constitutional: Negative for fatigue and weakness.  HENT: Positive for mouth dryness. Negative for mouth sores and nose dryness.   Eyes: Negative for redness and dryness.  Respiratory: Negative for cough, hemoptysis, shortness of breath and difficulty breathing.   Cardiovascular: Positive for palpitations. Negative for chest pain, hypertension, irregular heartbeat and swelling in legs/feet.  Gastrointestinal: Positive for constipation and diarrhea. Negative for blood in stool.  Endocrine:  Negative for increased urination.  Genitourinary: Negative for painful urination.  Musculoskeletal: Positive for arthralgias, joint pain, morning stiffness and muscle tenderness. Negative for joint swelling, myalgias, muscle weakness and myalgias.  Skin: Negative for color change, pallor, rash, hair loss, nodules/bumps, redness, skin tightness, ulcers and sensitivity to sunlight.  Allergic/Immunologic: Negative for susceptible to infections.  Neurological: Negative for dizziness, numbness and headaches.  Hematological: Negative for swollen glands.  Psychiatric/Behavioral: Positive for depressed mood and sleep disturbance. The patient is nervous/anxious.     PMFS History:  Patient Active Problem List   Diagnosis Date Noted  . Fibromyalgia 12/29/2016  . Primary osteoarthritis of both hips 12/29/2016  . Primary osteoarthritis of both knees 12/29/2016  . Infection   . Cellulitis 01/05/2016  . Patellofemoral arthritis of right knee 01/02/2016  . 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 S/P cholecystectomy 03/07/2015  . RUQ abdominal pain 01/10/2015  . History of anal fissures 11/25/2013  . Diminished ovarian reserve 11/06/2013    Past Medical History:  Diagnosis Date  . Anxiety   . Arrhythmia    Mitral valve regurgitation per pt- OV Dr Chancy Milroy, cardio 6/13- states had eccho and EKG  . Depression   . Family history of adverse reaction to anesthesia    don't know, she was adopted  . Fibromyalgia   . GERD (gastroesophageal reflux disease)   . Headache   . History of hiatal hernia   . History of kidney stones   . Knee pain, right    arthrofibrosis rt knee  . Mitral valve  regurgitation   . Pneumonia   . Rosacea     Family History  Adopted: Yes   Past Surgical History:  Procedure Laterality Date  . CHOLECYSTECTOMY N/A 01/02/2015   Procedure: LAPAROSCOPIC CHOLECYSTECTOMY WITH INTRAOPERATIVE CHOLANGIOGRAM;  Surgeon: Stark Klein, MD;  Location: [facility];  Service: General;  Laterality: N/A;  .  COLONOSCOPY  03/15/2011   NORMAL  . CRYOTHERAPY     CERVIX  . FRACTURE SURGERY     2013  . JOINT REPLACEMENT Right    partial   . KNEE ARTHROSCOPY  02/03/2012   Procedure: ARTHROSCOPY KNEE;  Surgeon: Johnn Hai, MD;  Location: [facility];  Service: Orthopedics;  Laterality: Right;  Right Knee Arthroscopy with Manipulation Under Anesthesia/Evaluation Under Anesthesia/Debridement  . LITHOTRIPSY    . ORIF PATELLA  07/22/2011   Procedure: OPEN REDUCTION INTERNAL (ORIF) FIXATION PATELLA;  Surgeon: Johnn Hai, MD;  Location: [facility];  Service: Orthopedics;  Laterality: Right;  . PATELLA-FEMORAL ARTHROPLASTY Right 01/02/2016   Procedure: RIGHT KNEE PATELLA-FEMORAL ARTHROPLASTY;  Surgeon: Susa Day, MD;  Location: [facility];  Service: Orthopedics;  Laterality: Right;  Requests 2.5 hrs  . UPPER GI ENDOSCOPY  2016  . WISDOM TOOTH EXTRACTION     Social History   Social History Narrative  . Not on file     Objective: Vital Signs: BP 125/82 (BP Location: Left Arm, Patient Position: Sitting, Cuff Size: Normal)   Pulse 97   Resp 15   Ht 5' (1.524 m)   Wt 163 lb (73.9 kg)   BMI 31.83 kg/m    Physical Exam  Constitutional: She is oriented to person, place, and time. She appears well-developed and well-nourished.  HENT:  Head: Normocephalic and atraumatic.  Eyes: Conjunctivae and EOM are normal.  Neck: Normal range of motion.  Cardiovascular: Normal rate, regular rhythm, normal heart sounds and intact distal pulses.  Pulmonary/Chest: Effort normal and breath sounds normal.  Abdominal: Soft. Bowel sounds are normal.  Lymphadenopathy:    She has no cervical adenopathy.  Neurological: She is alert and oriented to person, place, and time.  Skin: Skin is warm and dry. Capillary refill takes less than 2 seconds.  Psychiatric: She has a normal mood and affect. Her behavior is normal.  Nursing note and vitals reviewed.    Musculoskeletal Exam: C-spine, thoracic, and lumbar spine good ROM.   No midline spinal tenderness. SI joint pain.  Shoulder joints, elbow joints, and wrist joints good ROM.  MCPs, PIPs, and DIPs good ROM with no synovitis.  Hip joints and ankle joints good ROM with no synovitis.  Right knee slightly limited flexion and extension.  No warmth or effusion of bilateral knees.  MTPs, PIPs, and DIPs good ROM with no synovitis.  No trochanteric bursitis.   CDAI Exam: No CDAI exam completed.    Investigation: No additional findings. CBC Latest Ref Rng & Units 09/09/2016 08/08/2016 03/12/2016  WBC 4.0 - 10.5 K/uL 11.1(H) 12.0(H) 13.0(H)  Hemoglobin 12.0 - 15.0 g/dL 13.3 12.2 12.7  Hematocrit 36.0 - 46.0 % 38.4 35.6(L) 36.6  Platelets 150 - 400 K/uL 245 256 257   CMP Latest Ref Rng & Units 09/09/2016 08/08/2016 03/12/2016  Glucose 65 - 99 mg/dL 126(H) 110(H) 124(H)  BUN 6 - 20 mg/dL 11 14 17   Creatinine 0.44 - 1.00 mg/dL 0.90 1.00 0.89  Sodium 135 - 145 mmol/L 142 139 139  Potassium 3.5 - 5.1 mmol/L 3.3(L) 4.2 3.5  Chloride 101 - 111 mmol/L 109 106 110  CO2 22 - 32 mmol/L 23 23 23   Calcium 8.9 - 10.3 mg/dL 9.3 9.7 9.1  Total Protein 6.5 - 8.1 g/dL 7.3 8.0 7.2  Total Bilirubin 0.3 - 1.2 mg/dL 0.4 0.6 0.5  Alkaline Phos 38 - 126 U/L 66 63 68  AST 15 - 41 U/L 28 47(H) 27  ALT 14 - 54 U/L 32 40 32    Imaging: No results found.  Speciality Comments: No specialty comments available.    Procedures:  Trigger Point Inj Date/Time: 12/29/2016 3:39 PM Performed by: Bo Merino, MD Authorized by: Bo Merino, MD   Consent Given by:  Patient Site marked: the procedure site was marked   Timeout: prior to procedure the correct patient, procedure, and site was verified   Indications:  Therapeutic Total # of Trigger Points:  2 Location: neck   Needle Size:  27 G Approach:  Dorsal Patient tolerance:  Patient tolerated the procedure well with no immediate complications   Allergies: Diazepam   Assessment / Plan:     Visit Diagnoses: Primary osteoarthritis  of both hips: She has pain in her right hip with rotation.  Left hip is doing well.    Fibromyalgia: Patient has generalized hyperalgesia on exam.  She continues to have insomnia and fatigue.  She is on Flexeril and Tramadol.  She has an appointment with pain management 01/11/17.  She requested two cortisone trigger point injections of her trapezius muscle.    Primary osteoarthritis of both knees: Patient is followed by [facility].  She had a right partial knee replacement December 2017.    Pain in neck - She has tenderness in her trapezius muscle.  She has been having stiffness in her neck and muscle spasms.  She had a cortisone injection of right and left trapezius muscles.  She tolerated the procedure well.  Plan: Trigger Point Inj  History of patellar fracture - right; partial knee replacement December 2017   Other medical conditions are listed as follows:   History of gastroesophageal reflux (GERD)  History of anxiety  History of depression  History of insomnia  History of rosacea  Former smoker  History of cholecystectomy    Orders: Orders Placed This Encounter  Procedures  . Trigger Point Inj   No orders of the defined types were placed in this encounter.   Follow-Up Instructions: Return if symptoms worsen or fail to improve, for Osteoarthritis, Fibromyalgia.  Bo Merino, MD Note - This record has been created using Editor, commissioning.  Chart creation errors have been sought, but may not always  have been located. Such creation errors do not reflect on  the standard of medical care.

## 2016-12-29 ENCOUNTER — Ambulatory Visit: Payer: BC Managed Care – PPO | Admitting: Rheumatology

## 2016-12-29 ENCOUNTER — Encounter: Payer: Self-pay | Admitting: Rheumatology

## 2016-12-29 VITALS — BP 125/82 | HR 97 | Resp 15 | Ht 60.0 in | Wt 163.0 lb

## 2016-12-29 DIAGNOSIS — Z9049 Acquired absence of other specified parts of digestive tract: Secondary | ICD-10-CM

## 2016-12-29 DIAGNOSIS — Z8659 Personal history of other mental and behavioral disorders: Secondary | ICD-10-CM

## 2016-12-29 DIAGNOSIS — M542 Cervicalgia: Secondary | ICD-10-CM

## 2016-12-29 DIAGNOSIS — M797 Fibromyalgia: Secondary | ICD-10-CM

## 2016-12-29 DIAGNOSIS — M170 Bilateral primary osteoarthritis of knee: Secondary | ICD-10-CM | POA: Insufficient documentation

## 2016-12-29 DIAGNOSIS — Z8781 Personal history of (healed) traumatic fracture: Secondary | ICD-10-CM | POA: Diagnosis not present

## 2016-12-29 DIAGNOSIS — M160 Bilateral primary osteoarthritis of hip: Secondary | ICD-10-CM | POA: Diagnosis not present

## 2016-12-29 DIAGNOSIS — Z872 Personal history of diseases of the skin and subcutaneous tissue: Secondary | ICD-10-CM | POA: Diagnosis not present

## 2016-12-29 DIAGNOSIS — Z8719 Personal history of other diseases of the digestive system: Secondary | ICD-10-CM

## 2016-12-29 DIAGNOSIS — Z87898 Personal history of other specified conditions: Secondary | ICD-10-CM

## 2016-12-29 DIAGNOSIS — Z87891 Personal history of nicotine dependence: Secondary | ICD-10-CM | POA: Diagnosis not present

## 2016-12-29 NOTE — Patient Instructions (Signed)
Neck Exercises Neck exercises can be important for many reasons:  They can help you to improve and maintain flexibility in your neck. This can be especially important as you age.  They can help to make your neck stronger. This can make movement easier.  They can reduce or prevent neck pain.  They may help your upper back.  Ask your health care provider which neck exercises would be best for you. Exercises Neck Press Repeat this exercise 10 times. Do it first thing in the morning and right before bed or as told by your health care provider. 1. Lie on your back on a firm bed or on the floor with a pillow under your head. 2. Use your neck muscles to push your head down on the pillow and straighten your spine. 3. Hold the position as well as you can. Keep your head facing up and your chin tucked. 4. Slowly count to 5 while holding this position. 5. Relax for a few seconds. Then repeat.  Isometric Strengthening Do a full set of these exercises 2 times a day or as told by your health care provider. 1. Sit in a supportive chair and place your hand on your forehead. 2. Push forward with your head and neck while pushing back with your hand. Hold for 10 seconds. 3. Relax. Then repeat the exercise 3 times. 4. Next, do thesequence again, this time putting your hand against the back of your head. Use your head and neck to push backward against the hand pressure. 5. Finally, do the same exercise on either side of your head, pushing sideways against the pressure of your hand.  Prone Head Lifts Repeat this exercise 5 times. Do this 2 times a day or as told by your health care provider. 1. Lie face-down, resting on your elbows so that your chest and upper back are raised. 2. Start with your head facing downward, near your chest. Position your chin either on or near your chest. 3. Slowly lift your head upward. Lift until you are looking straight ahead. Then continue lifting your head as far back as  you can stretch. 4. Hold your head up for 5 seconds. Then slowly lower it to your starting position.  Supine Head Lifts Repeat this exercise 8-10 times. Do this 2 times a day or as told by your health care provider. 1. Lie on your back, bending your knees to point to the ceiling and keeping your feet flat on the floor. 2. Lift your head slowly off the floor, raising your chin toward your chest. 3. Hold for 5 seconds. 4. Relax and repeat.  Scapular Retraction Repeat this exercise 5 times. Do this 2 times a day or as told by your health care provider. 1. Stand with your arms at your sides. Look straight ahead. 2. Slowly pull both shoulders backward and downward until you feel a stretch between your shoulder blades in your upper back. 3. Hold for 10-30 seconds. 4. Relax and repeat.  Contact a health care provider if:  Your neck pain or discomfort gets much worse when you do an exercise.  Your neck pain or discomfort does not improve within 2 hours after you exercise. If you have any of these problems, stop exercising right away. Do not do the exercises again unless your health care provider says that you can. Get help right away if:  You develop sudden, severe neck pain. If this happens, stop exercising right away. Do not do the exercises again unless your   health care provider says that you can. Exercises Neck Stretch  Repeat this exercise 3-5 times. 1. Do this exercise while standing or while sitting in a chair. 2. Place your feet flat on the floor, shoulder-width apart. 3. Slowly turn your head to the right. Turn it all the way to the right so you can look over your right shoulder. Do not tilt or tip your head. 4. Hold this position for 10-30 seconds. 5. Slowly turn your head to the left, to look over your left shoulder. 6. Hold this position for 10-30 seconds.  Neck Retraction Repeat this exercise 8-10 times. Do this 3-4 times a day or as told by your health care  provider. 1. Do this exercise while standing or while sitting in a sturdy chair. 2. Look straight ahead. Do not bend your neck. 3. Use your fingers to push your chin backward. Do not bend your neck for this movement. Continue to face straight ahead. If you are doing the exercise properly, you will feel a slight sensation in your throat and a stretch at the back of your neck. 4. Hold the stretch for 1-2 seconds. Relax and repeat.  This information is not intended to replace advice given to you by your health care provider. Make sure you discuss any questions you have with your health care provider. Document Released: 12/02/2014 Document Revised: 05/29/2015 Document Reviewed: 07/01/2014 Elsevier Interactive Patient Education  2018 [employer] Inc[employer]  

## 2017-03-07 ENCOUNTER — Ambulatory Visit: Payer: BC Managed Care – PPO | Admitting: Rheumatology

## 2017-09-21 ENCOUNTER — Other Ambulatory Visit (HOSPITAL_COMMUNITY): Payer: Self-pay | Admitting: Gastroenterology

## 2017-09-21 DIAGNOSIS — R945 Abnormal results of liver function studies: Principal | ICD-10-CM

## 2017-09-21 DIAGNOSIS — R7989 Other specified abnormal findings of blood chemistry: Secondary | ICD-10-CM

## 2017-09-27 ENCOUNTER — Other Ambulatory Visit: Payer: Self-pay | Admitting: Radiology

## 2017-09-29 ENCOUNTER — Other Ambulatory Visit: Payer: Self-pay | Admitting: Physician Assistant

## 2017-09-29 ENCOUNTER — Ambulatory Visit (HOSPITAL_COMMUNITY)
Admission: RE | Admit: 2017-09-29 | Discharge: 2017-09-29 | Disposition: A | Discharge status: Home / Self Care | Payer: BC Managed Care – PPO | Source: Ambulatory Visit | Attending: Gastroenterology | Admitting: Gastroenterology

## 2017-09-29 ENCOUNTER — Other Ambulatory Visit: Payer: Self-pay

## 2017-09-29 ENCOUNTER — Ambulatory Visit (HOSPITAL_COMMUNITY)
Admission: RE | Admit: 2017-09-29 | Discharge: 2017-09-29 | Disposition: A | Discharge status: Home / Self Care | Payer: BC Managed Care – PPO | Source: Ambulatory Visit | Attending: Physician Assistant | Admitting: Physician Assistant

## 2017-09-29 ENCOUNTER — Encounter (HOSPITAL_COMMUNITY): Payer: Self-pay

## 2017-09-29 DIAGNOSIS — I340 Nonrheumatic mitral (valve) insufficiency: Secondary | ICD-10-CM | POA: Insufficient documentation

## 2017-09-29 DIAGNOSIS — Z9049 Acquired absence of other specified parts of digestive tract: Secondary | ICD-10-CM | POA: Insufficient documentation

## 2017-09-29 DIAGNOSIS — Z96651 Presence of right artificial knee joint: Secondary | ICD-10-CM | POA: Diagnosis not present

## 2017-09-29 DIAGNOSIS — F329 Major depressive disorder, single episode, unspecified: Secondary | ICD-10-CM | POA: Diagnosis not present

## 2017-09-29 DIAGNOSIS — Z7982 Long term (current) use of aspirin: Secondary | ICD-10-CM | POA: Diagnosis not present

## 2017-09-29 DIAGNOSIS — F419 Anxiety disorder, unspecified: Secondary | ICD-10-CM | POA: Diagnosis not present

## 2017-09-29 DIAGNOSIS — Z79899 Other long term (current) drug therapy: Secondary | ICD-10-CM | POA: Diagnosis not present

## 2017-09-29 DIAGNOSIS — Z87442 Personal history of urinary calculi: Secondary | ICD-10-CM | POA: Diagnosis not present

## 2017-09-29 DIAGNOSIS — R945 Abnormal results of liver function studies: Secondary | ICD-10-CM | POA: Insufficient documentation

## 2017-09-29 DIAGNOSIS — K219 Gastro-esophageal reflux disease without esophagitis: Secondary | ICD-10-CM | POA: Diagnosis not present

## 2017-09-29 DIAGNOSIS — M797 Fibromyalgia: Secondary | ICD-10-CM | POA: Insufficient documentation

## 2017-09-29 DIAGNOSIS — K7581 Nonalcoholic steatohepatitis (NASH): Secondary | ICD-10-CM | POA: Diagnosis not present

## 2017-09-29 DIAGNOSIS — Z87891 Personal history of nicotine dependence: Secondary | ICD-10-CM | POA: Insufficient documentation

## 2017-09-29 DIAGNOSIS — R7989 Other specified abnormal findings of blood chemistry: Secondary | ICD-10-CM

## 2017-09-29 LAB — COMPREHENSIVE METABOLIC PANEL
ALK PHOS: 66 U/L (ref 38–126)
ALT: 38 U/L (ref 0–44)
ANION GAP: 11 (ref 5–15)
AST: 31 U/L (ref 15–41)
Albumin: 4.2 g/dL (ref 3.5–5.0)
BUN: 17 mg/dL (ref 6–20)
CALCIUM: 9.2 mg/dL (ref 8.9–10.3)
CO2: 21 mmol/L — AB (ref 22–32)
Chloride: 106 mmol/L (ref 98–111)
Creatinine, Ser: 0.92 mg/dL (ref 0.44–1.00)
GFR calc non Af Amer: >60 mL/min (ref >60)
Glucose, Bld: 91 mg/dL (ref 70–99)
Potassium: 3.8 mmol/L (ref 3.5–5.1)
SODIUM: 138 mmol/L (ref 135–145)
TOTAL PROTEIN: 8.1 g/dL (ref 6.5–8.1)
Total Bilirubin: 0.4 mg/dL (ref 0.3–1.2)

## 2017-09-29 LAB — CBC WITH DIFFERENTIAL/PLATELET
Basophils Absolute: 0 10*3/uL (ref 0.0–0.1)
Basophils Relative: 0 %
EOS ABS: 0.2 10*3/uL (ref 0.0–0.7)
Eosinophils Relative: 2 %
HCT: 39 % (ref 36.0–46.0)
HEMOGLOBIN: 13.3 g/dL (ref 12.0–15.0)
LYMPHS ABS: 3.1 10*3/uL (ref 0.7–4.0)
Lymphocytes Relative: 23 %
MCH: 30.7 pg (ref 26.0–34.0)
MCHC: 34.1 g/dL (ref 30.0–36.0)
MCV: 90.1 fL (ref 78.0–100.0)
MONOS PCT: 4 %
Monocytes Absolute: 0.5 10*3/uL (ref 0.1–1.0)
NEUTROS PCT: 71 %
Neutro Abs: 9.7 10*3/uL — ABNORMAL HIGH (ref 1.7–7.7)
Platelets: 334 10*3/uL (ref 150–400)
RBC: 4.33 MIL/uL (ref 3.87–5.11)
RDW: 13.2 % (ref 11.5–15.5)
WBC: 13.5 10*3/uL — ABNORMAL HIGH (ref 4.0–10.5)

## 2017-09-29 LAB — PROTIME-INR
INR: 0.81
PROTHROMBIN TIME: 11.1 s — AB (ref 11.4–15.2)

## 2017-09-29 MED ORDER — GELATIN ABSORBABLE 12-7 MM EX MISC
CUTANEOUS | Status: AC
  Filled 2017-09-29: qty 1

## 2017-09-29 MED ORDER — NAPROXEN 250 MG PO TABS
500.0000 mg | ORAL_TABLET | Freq: Once | ORAL | Status: AC
  Administered 2017-09-29: 500 mg via ORAL
  Filled 2017-09-29: qty 2

## 2017-09-29 MED ORDER — FENTANYL CITRATE (PF) 100 MCG/2ML IJ SOLN
INTRAMUSCULAR | Status: AC
  Filled 2017-09-29: qty 2

## 2017-09-29 MED ORDER — MIDAZOLAM HCL 2 MG/2ML IJ SOLN
INTRAMUSCULAR | Status: AC
  Filled 2017-09-29: qty 2

## 2017-09-29 MED ORDER — FENTANYL CITRATE (PF) 100 MCG/2ML IJ SOLN
INTRAMUSCULAR | Status: AC | PRN
  Administered 2017-09-29 (×2): 50 ug via INTRAVENOUS

## 2017-09-29 MED ORDER — NAPROXEN 250 MG PO TABS: 500.0000 mg | ORAL_TABLET | Freq: Two times a day (BID) | ORAL | Status: DC

## 2017-09-29 MED ORDER — MIDAZOLAM HCL 2 MG/2ML IJ SOLN
INTRAMUSCULAR | Status: AC | PRN
  Administered 2017-09-29 (×3): 1 mg via INTRAVENOUS

## 2017-09-29 MED ORDER — LIDOCAINE HCL 1 % IJ SOLN
INTRAMUSCULAR | Status: AC
  Filled 2017-09-29: qty 20

## 2017-09-29 MED ORDER — SODIUM CHLORIDE 0.9 % IV SOLN
INTRAVENOUS | Status: DC
  Administered 2017-09-29: 11:00:00 via INTRAVENOUS

## 2017-09-29 NOTE — H&P (Signed)
 Chief Complaint: Patient was seen in consultation today for image guided random liver biopsy.   Referring Physician(s): Mann,Jyothi/Dr. Maynard  Supervising Physician: McCullough, Heath  Patient Status: [facility] - Out-pt  History of Present Illness: Sarah Cobb is a 47 y.o. female with a past medical history significant for mitral valve regurgitation, GERD, fibromyalgia, nephrolithiasis, hematuria and most recently elevated liver function tests. Patient reports that she has ongoing RUQ pain for years that waxes and wanes and does not appear related to food intake, it is only minimally helped by medications. She underwent cholecystectomy 12/30/14. She states that her PCP was following her liver enzymes and they "went up and down all the time" so ultimately the decision was made to pursue further testing. She denies ever having imaging of her liver before. She reports minimal ETOH use and no tylenol use - she use PRN ibuprofen for pain control.  Patient states she is very nervous today, she was hoping she would be completely asleep during the procedure but is ok to proceed with moderate sedation as explained during exam. She reports daily nausea without vomiting as well as RUQ abdominal pain. She denies chest pain, dyspnea, changes in bowel habits, fever or chills. Last PO intake was 6-630 am this morning, she does not take blood thinning medication, last medication she took was Dexilant at 330 am today.  Past Medical History:  Diagnosis Date  . Anxiety   . Arrhythmia    Mitral valve regurgitation per pt- OV Dr Kahn, cardio 6/13- states had eccho and EKG  . Depression   . Family history of adverse reaction to anesthesia    don't know, she was adopted  . Fibromyalgia   . GERD (gastroesophageal reflux disease)   . Headache   . History of hiatal hernia   . History of kidney stones   . Knee pain, right    arthrofibrosis rt knee  . Mitral valve regurgitation   . Pneumonia   . Rosacea      Past Surgical History:  Procedure Laterality Date  . CHOLECYSTECTOMY N/A 01/02/2015   Procedure: LAPAROSCOPIC CHOLECYSTECTOMY WITH INTRAOPERATIVE CHOLANGIOGRAM;  Surgeon: Faera Byerly, MD;  Location: [facility] OR;  Service: General;  Laterality: N/A;  . COLONOSCOPY  03/15/2011   NORMAL  . CRYOTHERAPY     CERVIX  . FRACTURE SURGERY     2013  . JOINT REPLACEMENT Right    partial   . KNEE ARTHROSCOPY  02/03/2012   Procedure: ARTHROSCOPY KNEE;  Surgeon: Jeffrey C Beane, MD;  Location: [facility];  Service: Orthopedics;  Laterality: Right;  Right Knee Arthroscopy with Manipulation Under Anesthesia/Evaluation Under Anesthesia/Debridement  . LITHOTRIPSY    . ORIF PATELLA  07/22/2011   Procedure: OPEN REDUCTION INTERNAL (ORIF) FIXATION PATELLA;  Surgeon: Jeffrey C Beane, MD;  Location: [facility];  Service: Orthopedics;  Laterality: Right;  . PATELLA-FEMORAL ARTHROPLASTY Right 01/02/2016   Procedure: RIGHT KNEE PATELLA-FEMORAL ARTHROPLASTY;  Surgeon: Jeffrey Beane, MD;  Location: [facility];  Service: Orthopedics;  Laterality: Right;  Requests 2.5 hrs  . UPPER GI ENDOSCOPY  2016  . WISDOM TOOTH EXTRACTION      Allergies: Diazepam  Medications: Prior to Admission medications   Medication Sig Start Date End Date Taking? Authorizing Provider  ALPRAZolam (XANAX) 0.25 MG tablet Take 0.25 mg by mouth 3 (three) times daily as needed for anxiety.  05/30/13  Yes [provider]  cholestyramine (QUESTRAN) 4 GM/DOSE powder Take 4 g by mouth 3 (three) times daily with meals.     Yes [provider]  cyclobenzaprine (FLEXERIL) 10 MG tablet Take 1 tablet (10 mg total) by mouth 3 (three) times daily as needed for muscle spasms. 01/02/16  Yes Lacie Draft M, PA-C  dexlansoprazole (DEXILANT) 60 MG capsule Take 60 mg by mouth every morning.   Yes [provider]  LO LOESTRIN FE 1 MG-10 MCG / 10 MCG tablet Take 1 tablet by mouth every morning.  05/25/13  Yes [provider]  aspirin EC 325  MG tablet Take 1 tablet (325 mg total) by mouth 2 (two) times daily. Patient not taking: Reported on 08/08/2016 01/02/16   Susa Day, MD  docusate sodium (COLACE) 100 MG capsule Take 1 capsule (100 mg total) by mouth 2 (two) times daily as needed for mild constipation. Patient not taking: Reported on 08/08/2016 01/02/16   Susa Day, MD  LYRICA 50 MG capsule Take 50 mg by mouth daily.  12/19/15   [provider]  naproxen (NAPROSYN) 500 MG tablet Take 1 tablet (500 mg total) by mouth 2 (two) times daily with a meal. Patient not taking: Reported on 09/09/2016 08/08/16   Street, [place], PA-C  ondansetron (ZOFRAN ODT) 4 MG disintegrating tablet Take 1 tablet (4 mg total) by mouth every 8 (eight) hours as needed for nausea or vomiting. Patient not taking: Reported on 09/09/2016 08/08/16   Street, [place], PA-C  ondansetron (ZOFRAN) 4 MG tablet Take 1 tablet (4 mg total) by mouth every 8 (eight) hours as needed for nausea or vomiting. Patient not taking: Reported on 08/08/2016 06/19/14   Lafayette Dragon, MD  OVER THE COUNTER MEDICATION Take 1 capsule by mouth daily.    [provider]  OVER THE COUNTER MEDICATION at bedtime.    [provider]  polyethylene glycol (MIRALAX / GLYCOLAX) packet Take 17 g by mouth daily. Patient not taking: Reported on 08/08/2016 01/02/16   Susa Day, MD  traMADol (ULTRAM) 50 MG tablet Take 1 tablet (50 mg total) by mouth every 6 (six) hours as needed for severe pain. 09/09/16   Dalia Heading, PA-C     Family History  Adopted: Yes    Social History   Socioeconomic History  . Marital status: Married    Spouse name: Not on file  . Number of children: 0  . Years of education: Not on file  . Highest education level: Not on file  Occupational History  . Occupation: Financial trader with [employer]  Social Needs  . Financial resource strain: Not on file  . Food insecurity:    Worry: Not on file    Inability: Not on file  .  Transportation needs:    Medical: Not on file    Non-medical: Not on file  Tobacco Use  . Smoking status: Former Smoker    Years: 15.00    Types: Cigarettes    Last attempt to quit: 07/21/1998    Years since quitting: 19.2  . Smokeless tobacco: Never Used  Substance and Sexual Activity  . Alcohol use: Yes    Alcohol/week: 0.0 standard drinks    Comment: socially  . Drug use: No  . Sexual activity: Not Currently  Lifestyle  . Physical activity:    Days per week: Not on file    Minutes per session: Not on file  . Stress: Not on file  Relationships  . Social connections:    Talks on phone: Not on file    Gets together: Not on file    Attends religious service: Not  on file    Active member of club or organization: Not on file    Attends meetings of clubs or organizations: Not on file    Relationship status: Not on file  Other Topics Concern  . Not on file  Social History Narrative  . Not on file     Review of Systems: A 12 point ROS discussed and pertinent positives are indicated in the HPI above.  All other systems are negative.  Review of Systems  Constitutional: Negative for chills and fever.  Respiratory: Negative for chest tightness and shortness of breath.   Cardiovascular: Negative for chest pain.  Gastrointestinal: Positive for abdominal pain (RUQ) and nausea. Negative for constipation, diarrhea and vomiting.  Skin: Negative for rash.  Neurological: Negative for dizziness and syncope.  Psychiatric/Behavioral: Negative for confusion.    Vital Signs: BP (!) 126/97 (BP Location: Right Arm)   Pulse (!) 103   Temp 98.7 F (37.1 C) (Oral)   Resp 18   SpO2 100%   Physical Exam  Constitutional: She is oriented to person, place, and time. No distress.  HENT:  Head: Normocephalic.  Eyes: No scleral icterus.  Cardiovascular: Normal rate, regular rhythm and normal heart sounds.  Pulmonary/Chest: Effort normal and breath sounds normal.  Abdominal: Soft. She  exhibits no distension and no mass. There is tenderness (RUQ).  Liver edge is non-palpable during exam  Neurological: She is alert and oriented to person, place, and time.  Skin: Skin is warm and dry. She is not diaphoretic.  Psychiatric: She has a normal mood and affect. Her behavior is normal. Judgment and thought content normal.  Vitals reviewed.    MD Evaluation Airway: WNL Heart: WNL Abdomen: WNL Chest/ Lungs: WNL ASA  Classification: 2 Mallampati/Airway Score: One   Imaging: No results found.  Labs:  CBC: Recent Labs    09/29/17 1109  WBC 13.5*  HGB 13.3  HCT 39.0  PLT 334    COAGS: Recent Labs    09/29/17 1109  INR 0.81    BMP: No results for input(s): NA, K, CL, CO2, GLUCOSE, BUN, CALCIUM, CREATININE, GFRNONAA, GFRAA in the last 8760 hours.  Invalid input(s): CMP  LIVER FUNCTION TESTS: No results for input(s): BILITOT, AST, ALT, ALKPHOS, PROT, ALBUMIN in the last 8760 hours.  TUMOR MARKERS: No results for input(s): AFPTM, CEA, CA199, CHROMGRNA in the last 8760 hours.  Assessment and Plan:  Patient for random US guided liver biopsy today due to elevated LFTs which have been monitored by PCP for at least a year per patient. She denies Tylenol use, does endorse minimal ETOH intake, no history of known liver issues previously, she has never had imaging of her liver that she is aware of. Per care everywhere most recent LFTs from 08/23/17 showing AST 28, ALT 33, alk phosphate 72.   Labs today show elevated WBC 13.5 with previous 11.1 one year ago, INR 0.88, AST 31, ALT 38, alk. Phose 66 (per care everywhere previously AST 28, ALT 33, alk. Phos. 72 on 08/23/17 and AST 40, ALT 64, alk. Phos 76 on 07/21/17) Patient is afebrile, she does not report taking any steroid medications. Last PO intake 6-630 this morning, she does not take blood thinning medications.  Risks and benefits discussed with the patient including, but not limited to bleeding, infection, damage to  adjacent structures or low yield requiring additional tests.  All of the patient's questions were answered, patient is agreeable to proceed.  Consent signed and in chart.  Thank   you for this interesting consult.  I greatly enjoyed meeting Venisa Picone and look forward to participating in their care.  A copy of this report was sent to the requesting provider on this date.  Electronically Signed:  A , PA-C 09/29/2017, 11:19 AM   I spent a total of  30 Minutes in face to face in clinical consultation, greater than 50% of which was counseling/coordinating care for random liver biopsy.   

## 2017-09-29 NOTE — Discharge Instructions (Addendum)
Moderate Conscious Sedation, Adult, Care After These instructions provide you with information about caring for yourself after your procedure. Your health care provider may also give you more specific instructions. Your treatment has been planned according to current medical practices, but problems sometimes occur. Call your health care provider if you have any problems or questions after your procedure. What can I expect after the procedure? After your procedure, it is common:  To feel sleepy for several hours.  To feel clumsy and have poor balance for several hours.  To have poor judgment for several hours.  To vomit if you eat too soon.  Follow these instructions at home: For at least 24 hours after the procedure:   Do not: ? Participate in activities where you could fall or become injured. ? Drive. ? Use heavy machinery. ? Drink alcohol. ? Take sleeping pills or medicines that cause drowsiness. ? Make important decisions or sign legal documents. ? Take care of children on your own.  Rest. Eating and drinking  Follow the diet recommended by your health care provider.  If you vomit: ? Drink water, juice, or soup when you can drink without vomiting. ? Make sure you have little or no nausea before eating solid foods. General instructions  Have a responsible adult stay with you until you are awake and alert.  Take over-the-counter and prescription medicines only as told by your health care provider.  If you smoke, do not smoke without supervision.  Keep all follow-up visits as told by your health care provider. This is important. Contact a health care provider if:  You keep feeling nauseous or you keep vomiting.  You feel light-headed.  You develop a rash.  You have a fever. Get help right away if:  You have trouble breathing. This information is not intended to replace advice given to you by your health care provider. Make sure you discuss any questions you have  with your health care provider. Document Released: 10/11/2012 Document Revised: 05/26/2015 Document Reviewed: 04/12/2015 Elsevier Interactive Patient Education  2018 [employer].   Liver Biopsy, Care After These instructions give you information on caring for yourself after your procedure. Your doctor may also give you more specific instructions. Call your doctor if you have any problems or questions after your procedure. Follow these instructions at home:  Rest at home for 1-2 days or as told by your doctor.  Have someone stay with you for at least 24 hours.  Do not do these things in the first 24 hours: ? Drive. ? Use machinery. ? Take care of other people. ? Sign legal documents. ? Take a bath or shower.  There are many different ways to close and cover a cut (incision). For example, a cut can be closed with stitches, skin glue, or adhesive strips. Follow your doctor's instructions on: ? Taking care of your cut. ? Changing and removing your bandage (dressing). ? Removing whatever was used to close your cut.  Do not drink alcohol in the first week.  Do not lift more than 5 pounds or play contact sports for the first 2 weeks.  Get your test results. Contact a doctor if:  A cut bleeds and leaves more than just a small spot of blood.  A cut is red, puffs up (swells), or hurts more than before.  Fluid or something else comes from a cut.  A cut smells bad.  You have a fever or chills. Get help right away if:  You have swelling,  bloating, or pain in your belly (abdomen).  You get dizzy or faint.  You have a rash.  You feel sick to your stomach (nauseous) or throw up (vomit).  You have trouble breathing, feel short of breath, or feel faint.  Your chest hurts.  You have problems talking or seeing.  You have trouble balancing or moving your arms or legs. This information is not intended to replace advice given to you by your health care provider. Make sure you  discuss any questions you have with your health care provider. Document Released: 09/30/2007 Document Revised: 05/29/2015 Document Reviewed: 02/16/2013 Elsevier Interactive Patient Education  [employer].

## 2017-09-29 NOTE — Procedures (Signed)
Interventional Radiology Procedure Note  Procedure: US guided random liver biopsy  Complications: None  Estimated Blood Loss: None  Recommendations: - DC home after 3 hrs   Signed,  Criselda Peaches, MD

## 2017-12-05 ENCOUNTER — Other Ambulatory Visit: Payer: Self-pay | Admitting: Physician Assistant

## 2017-12-05 DIAGNOSIS — M1990 Unspecified osteoarthritis, unspecified site: Secondary | ICD-10-CM

## 2018-01-10 ENCOUNTER — Emergency Department (HOSPITAL_COMMUNITY): Payer: BC Managed Care – PPO

## 2018-01-10 ENCOUNTER — Encounter (HOSPITAL_COMMUNITY): Payer: Self-pay | Admitting: Emergency Medicine

## 2018-01-10 ENCOUNTER — Emergency Department (HOSPITAL_COMMUNITY)
Admission: EM | Admit: 2018-01-10 | Discharge: 2018-01-11 | Disposition: A | Discharge status: Home / Self Care | Payer: BC Managed Care – PPO | Attending: Emergency Medicine | Admitting: Emergency Medicine

## 2018-01-10 DIAGNOSIS — Z79899 Other long term (current) drug therapy: Secondary | ICD-10-CM | POA: Insufficient documentation

## 2018-01-10 DIAGNOSIS — Z87891 Personal history of nicotine dependence: Secondary | ICD-10-CM | POA: Insufficient documentation

## 2018-01-10 DIAGNOSIS — R079 Chest pain, unspecified: Secondary | ICD-10-CM | POA: Diagnosis not present

## 2018-01-10 LAB — BASIC METABOLIC PANEL
ANION GAP: 10 (ref 5–15)
BUN: 11 mg/dL (ref 6–20)
CHLORIDE: 105 mmol/L (ref 98–111)
CO2: 23 mmol/L (ref 22–32)
Calcium: 9.1 mg/dL (ref 8.9–10.3)
Creatinine, Ser: 0.9 mg/dL (ref 0.44–1.00)
GFR calc non Af Amer: >60 mL/min (ref >60)
Glucose, Bld: 134 mg/dL — ABNORMAL HIGH (ref 70–99)
POTASSIUM: 3.7 mmol/L (ref 3.5–5.1)
SODIUM: 138 mmol/L (ref 135–145)

## 2018-01-10 LAB — CBC
HEMATOCRIT: 38.2 % (ref 36.0–46.0)
HEMOGLOBIN: 12.6 g/dL (ref 12.0–15.0)
MCH: 29.9 pg (ref 26.0–34.0)
MCHC: 33 g/dL (ref 30.0–36.0)
MCV: 90.5 fL (ref 80.0–100.0)
NRBC: 0 % (ref 0.0–0.2)
Platelets: 326 10*3/uL (ref 150–400)
RBC: 4.22 MIL/uL (ref 3.87–5.11)
RDW: 12.7 % (ref 11.5–15.5)
WBC: 14.5 10*3/uL — AB (ref 4.0–10.5)

## 2018-01-10 LAB — I-STAT BETA HCG BLOOD, ED (MC, WL, AP ONLY)

## 2018-01-10 LAB — I-STAT TROPONIN, ED: Troponin i, poc: 0 ng/mL (ref 0.00–0.08)

## 2018-01-10 NOTE — ED Triage Notes (Signed)
Pt states left sided chest pain X1 week that has gotten progressively worse tonight.  Pt states she has moments of SOB, Nausea and "sweating".  Hx of cardiac "years ago".

## 2018-01-11 LAB — I-STAT TROPONIN, ED: Troponin i, poc: 0 ng/mL (ref 0.00–0.08)

## 2018-01-11 MED ORDER — OXYCODONE-ACETAMINOPHEN 5-325 MG PO TABS
1.0000 | ORAL_TABLET | Freq: Once | ORAL | Status: AC
  Administered 2018-01-11: 1 via ORAL
  Filled 2018-01-11: qty 1

## 2018-01-11 NOTE — Discharge Instructions (Signed)
As we discussed, work-up today was normal including EKG, labs, and chest x-ray. Can follow-up with a cardiologist--if you do not hear from them within the next 24 hours from PCP referral, recommend to call and follow-up on this. Return to the ED for new or worsening symptoms.

## 2018-01-11 NOTE — ED Notes (Signed)
Pt discharged from ED; instructions provided; Pt encouraged to return to ED if symptoms worsen and to f/u with PCP; Pt verbalized understanding of all instructions

## 2018-01-11 NOTE — ED Provider Notes (Signed)
[facility] EMERGENCY DEPARTMENT Provider Note   CSN: [id number] Arrival date & time: 01/10/18  2031     History   Chief Complaint Chief Complaint  Patient presents with  . Chest Pain    HPI Sarah Cobb is a 48 y.o. female.  The history is provided by the patient and medical records.  Chest Pain     48 year old female with history of anxiety, depression, fibromyalgia, GERD, history of mitral valve regurgitation, presenting to the ED with chest pain.  Reports this is been intermittent over the past week.  Pain is beneath her left breast with some radiation towards the left back, occurring randomly but not necessarily associated with exertion.  States when pain comes it is a sharp "twinge", almost like a squeezing sensation.  States this lasts for a few seconds and then resolve spontaneously.  She has not noticed anything that brings the pain on, nothing seems to make it better or worse.  She has reported some "hot flashes" but this is been going on for a while and feels it is related to premenopausal issues.  States hot flashes have not been associated with pain directly.  No diaphoresis. She denies palpitations, dizziness, weakness, or feelings of syncope.  No pleuritic nature of pain.  No nausea/vomiting.  Patient reports evaluated by 2 cardiologists years ago, one said she had MR, other disagreed.  She is a former smoker.  No known cardiac history.  Unsure about family history as she was adopted.  No recent travel, prolonged immobilization, surgery, exogenous estrogens.  No hx of DVT or PE.  Has not tried anything for pain.    Past Medical History:  Diagnosis Date  . Anxiety   . Arrhythmia    Mitral valve regurgitation per pt- OV Dr Chancy Milroy, cardio 6/13- states had eccho and EKG  . Depression   . Family history of adverse reaction to anesthesia    don't know, she was adopted  . Fibromyalgia   . GERD (gastroesophageal reflux disease)   . Headache   . History of  hiatal hernia   . History of kidney stones   . Knee pain, right    arthrofibrosis rt knee  . Mitral valve regurgitation   . Pneumonia   . Rosacea     Patient Active Problem List   Diagnosis Date Noted  . Fibromyalgia 12/29/2016  . Primary osteoarthritis of both hips 12/29/2016  . Primary osteoarthritis of both knees 12/29/2016  . Infection   . Cellulitis 01/05/2016  . Patellofemoral arthritis of right knee 01/02/2016  . 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 S/P cholecystectomy 03/07/2015  . RUQ abdominal pain 01/10/2015  . History of anal fissures 11/25/2013  . Diminished ovarian reserve 11/06/2013    Past Surgical History:  Procedure Laterality Date  . CHOLECYSTECTOMY N/A 01/02/2015   Procedure: LAPAROSCOPIC CHOLECYSTECTOMY WITH INTRAOPERATIVE CHOLANGIOGRAM;  Surgeon: Stark Klein, MD;  Location: [facility];  Service: General;  Laterality: N/A;  . COLONOSCOPY  03/15/2011   NORMAL  . CRYOTHERAPY     CERVIX  . FRACTURE SURGERY     2013  . JOINT REPLACEMENT Right    partial   . KNEE ARTHROSCOPY  02/03/2012   Procedure: ARTHROSCOPY KNEE;  Surgeon: Johnn Hai, MD;  Location: [facility];  Service: Orthopedics;  Laterality: Right;  Right Knee Arthroscopy with Manipulation Under Anesthesia/Evaluation Under Anesthesia/Debridement  . LITHOTRIPSY    . ORIF PATELLA  07/22/2011   Procedure: OPEN REDUCTION INTERNAL (ORIF) FIXATION PATELLA;  Surgeon: Johnn Hai, MD;  Location: [facility];  Service: Orthopedics;  Laterality: Right;  . PATELLA-FEMORAL ARTHROPLASTY Right 01/02/2016   Procedure: RIGHT KNEE PATELLA-FEMORAL ARTHROPLASTY;  Surgeon: Susa Day, MD;  Location: [facility];  Service: Orthopedics;  Laterality: Right;  Requests 2.5 hrs  . UPPER GI ENDOSCOPY  2016  . WISDOM TOOTH EXTRACTION       OB History   No obstetric history on file.      Home Medications    Prior to Admission medications   Medication Sig Start Date End Date Taking? Authorizing Provider  ALPRAZolam (XANAX) 0.25 MG  tablet Take 0.25 mg by mouth 3 (three) times daily as needed for anxiety.  05/30/13   [provider]  aspirin EC 325 MG tablet Take 1 tablet (325 mg total) by mouth 2 (two) times daily. Patient not taking: Reported on 08/08/2016 01/02/16   Susa Day, MD  cholestyramine Lucrezia Starch) 4 GM/DOSE powder Take 4 g by mouth 3 (three) times daily with meals.    [provider]  cyclobenzaprine (FLEXERIL) 10 MG tablet Take 1 tablet (10 mg total) by mouth 3 (three) times daily as needed for muscle spasms. 01/02/16   Cecilie Kicks, PA-C  dexlansoprazole (DEXILANT) 60 MG capsule Take 60 mg by mouth every morning.    [provider]  docusate sodium (COLACE) 100 MG capsule Take 1 capsule (100 mg total) by mouth 2 (two) times daily as needed for mild constipation. Patient not taking: Reported on 08/08/2016 01/02/16   Susa Day, MD  LO LOESTRIN FE 1 MG-10 MCG / 10 MCG tablet Take 1 tablet by mouth every morning.  05/25/13   [provider]  LYRICA 50 MG capsule Take 50 mg by mouth daily.  12/19/15   [provider]  naproxen (NAPROSYN) 500 MG tablet Take 1 tablet (500 mg total) by mouth 2 (two) times daily with a meal. Patient not taking: Reported on 09/09/2016 08/08/16   Street, Nina, PA-C  ondansetron (ZOFRAN ODT) 4 MG disintegrating tablet Take 1 tablet (4 mg total) by mouth every 8 (eight) hours as needed for nausea or vomiting. Patient not taking: Reported on 09/09/2016 08/08/16   Street, Anza, PA-C  ondansetron (ZOFRAN) 4 MG tablet Take 1 tablet (4 mg total) by mouth every 8 (eight) hours as needed for nausea or vomiting. Patient not taking: Reported on 08/08/2016 06/19/14   Lafayette Dragon, MD  OVER THE COUNTER MEDICATION Take 1 capsule by mouth daily.    [provider]  OVER THE COUNTER MEDICATION at bedtime.    [provider]  polyethylene glycol (MIRALAX / GLYCOLAX) packet Take 17 g by mouth daily. Patient not taking: Reported on  08/08/2016 01/02/16   Susa Day, MD  traMADol (ULTRAM) 50 MG tablet Take 1 tablet (50 mg total) by mouth every 6 (six) hours as needed for severe pain. 09/09/16   Dalia Heading, PA-C    Family History Family History  Adopted: Yes    Social History Social History   Tobacco Use  . Smoking status: Former Smoker    Years: 15.00    Types: Cigarettes    Last attempt to quit: 07/21/1998    Years since quitting: 19.4  . Smokeless tobacco: Never Used  Substance Use Topics  . Alcohol use: Yes    Alcohol/week: 0.0 standard drinks    Comment: socially  . Drug use: No     Allergies   Diazepam   Review of Systems Review of Systems  Cardiovascular: Positive for chest pain.  All other systems reviewed and are negative.    Physical Exam Updated Vital Signs BP (!) 128/92 (BP Location: Left Arm)   Pulse 84   Temp 98.7 F (37.1 C) (Oral)   Resp 19   SpO2 96%   Physical Exam Vitals signs and nursing note reviewed.  Constitutional:      Appearance: She is well-developed.  HENT:     Head: Normocephalic and atraumatic.  Eyes:     Conjunctiva/sclera: Conjunctivae normal.     Pupils: Pupils are equal, round, and reactive to light.  Neck:     Musculoskeletal: Normal range of motion.  Cardiovascular:     Rate and Rhythm: Normal rate and regular rhythm.     Heart sounds: Normal heart sounds.  Pulmonary:     Effort: Pulmonary effort is normal.     Breath sounds: Normal breath sounds. No decreased breath sounds, wheezing or rhonchi.  Abdominal:     General: Bowel sounds are normal.     Palpations: Abdomen is soft.     Tenderness: There is no abdominal tenderness. There is no guarding or rebound.  Musculoskeletal: Normal range of motion.     Comments: No LE edema, calf pain or swelling, no overlying skin changes  Skin:    General: Skin is warm and dry.  Neurological:     Mental Status: She is alert and oriented to person, place, and time.      ED Treatments /  Results  Labs (all labs ordered are listed, but only abnormal results are displayed) Labs Reviewed  BASIC METABOLIC PANEL - Abnormal; Notable for the following components:      Result Value   Glucose, Bld 134 (*)    All other components within normal limits  CBC - Abnormal; Notable for the following components:   WBC 14.5 (*)    All other components within normal limits  I-STAT TROPONIN, ED  I-STAT BETA HCG BLOOD, ED ([facility], [facility], [facility] ONLY)  I-STAT TROPONIN, ED    EKG None   ED ECG REPORT   Date: 01/11/2018  Rate: 93  Rhythm: NSR  QRS Axis: normal  Intervals: normal  ST/T Wave abnormalities: nonspecific T wave changes  Conduction Disutrbances:none  Narrative Interpretation:   Old EKG Reviewed: unchanged  I have personally reviewed the EKG tracing and agree with the computerized printout as noted.  Radiology Dg Chest 2 View  Result Date: 01/10/2018 CLINICAL DATA:  Left-sided chest pain and dyspnea for 1 week. EXAM: CHEST - 2 VIEW COMPARISON:  01/05/2016 FINDINGS: The heart size and mediastinal contours are within normal limits. Both lungs are clear. The visualized skeletal structures are unremarkable. IMPRESSION: No active cardiopulmonary disease. Electronically Signed   By: Ashley Royalty M.D.   On: 01/10/2018 21:19    Procedures Procedures (including critical care time)     Medications Ordered in ED Medications  oxyCODONE-acetaminophen (PERCOCET/ROXICET) 5-325 MG per tablet 1 tablet (1 tablet Oral Given 01/11/18 0306)     Initial Impression / Assessment and Plan / ED Course  I have reviewed the triage vital signs and the nursing notes.  Pertinent labs & imaging results that were available during my care of the patient were reviewed by me and considered in my medical decision making (see chart for details).  48 year old female here with left-sided chest pain, intermittent over the past week.  No alleviating or exacerbating factors, no exertional component.  Has had some  "hot flashes" but attributes this to  menopausal issues.  She is afebrile and nontoxic.  EKG is nonischemic.  Initial labs are reassuring, troponin negative.  Chest x-ray is clear.  Patient does not have any risk factors for DVT or PE and no history of same.  She is PERC negative.  Delta troponin was obtained which remains negative.  At this point I have low suspicion for ACS, PE, dissection, or other acute cardiac event.  Reports she had requested referral to cardiology already from her primary care doctor, however since she was still having pain they had recommended ED evaluation.  She was given information for the cardiology office, will discuss ED visit with her PCP.  She will return here for any new or worsening symptoms.  Final Clinical Impressions(s) / ED Diagnoses   Final diagnoses:  Chest pain in adult    ED Discharge Orders    None       Larene Pickett, PA-C 01/11/18 0401    Duffy Bruce, MD 01/11/18 [phone]

## 2018-01-26 ENCOUNTER — Ambulatory Visit
Admission: RE | Admit: 2018-01-26 | Discharge: 2018-01-26 | Disposition: A | Discharge status: Home / Self Care | Payer: BC Managed Care – PPO | Source: Ambulatory Visit | Attending: Physician Assistant | Admitting: Physician Assistant

## 2018-01-26 DIAGNOSIS — M1990 Unspecified osteoarthritis, unspecified site: Secondary | ICD-10-CM

## 2018-02-03 IMAGING — CT CT RENAL STONE PROTOCOL
2 of 3 series · 17 of 46 positions shown, 19 images · non-contrast
Comparison: 08/08/2016 CT abdomen and pelvis.

CLINICAL DATA: 46 y/o F; left flank pain radiating to the left
groin for 24 hours. Nausea and diarrhea.

EXAM:
CT ABDOMEN AND PELVIS WITHOUT CONTRAST
TECHNIQUE: Multidetector CT imaging of the abdomen and pelvis was performed
following the standard protocol without IV contrast.

[Series 3: coronal · coronal · 0.82mm/px · 3 of 125 slices shown]
[im 42/125  soft-tissue]
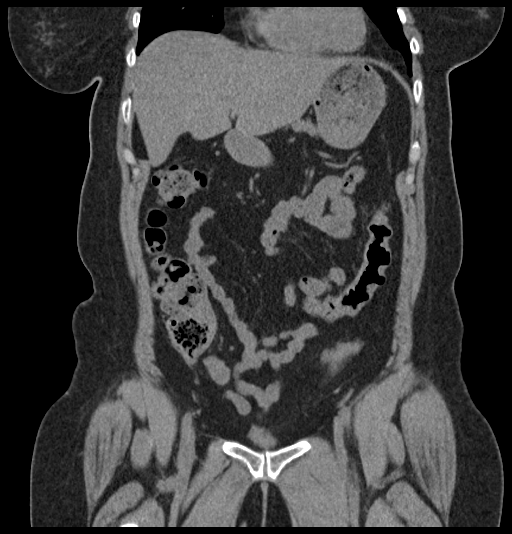
[im 56/125  soft-tissue]
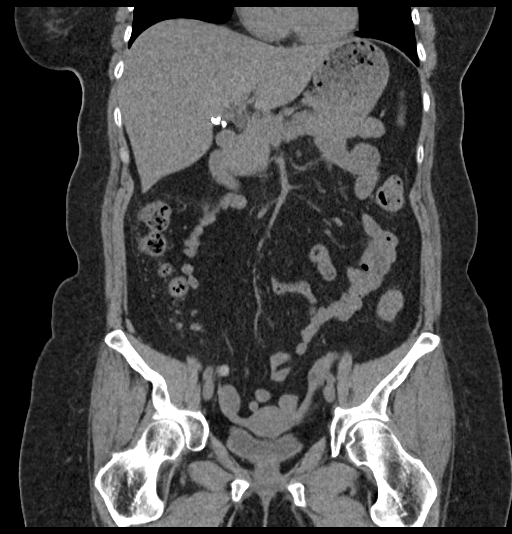
[im 69/125  soft-tissue]
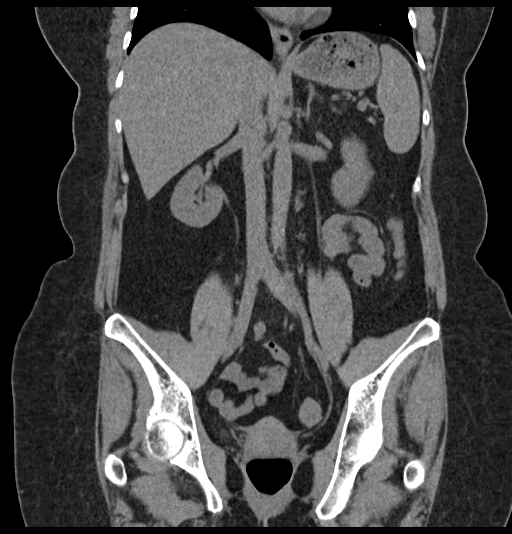

[Series 6: lung · axial · 0.70mm/px · z∈[-62,-8]mm · 14 of 32 slices shown, 16 images]
[im 3/32  soft-tissue]
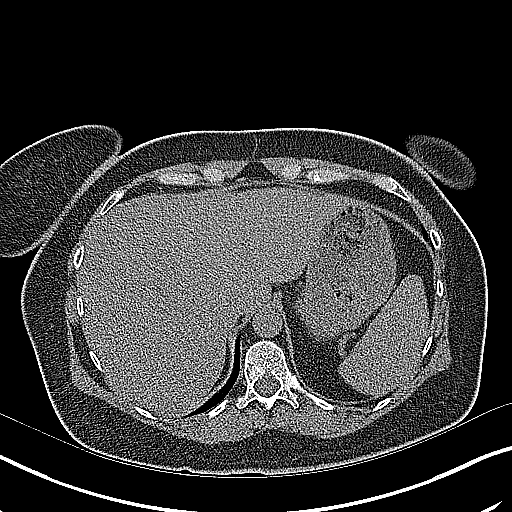
[im 3/32  bone]
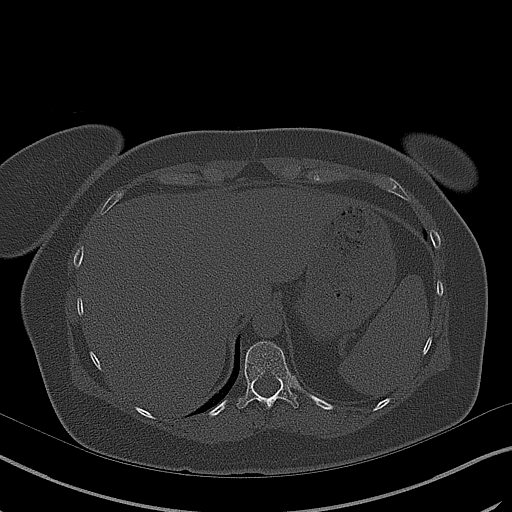
[im 5/32  soft-tissue]
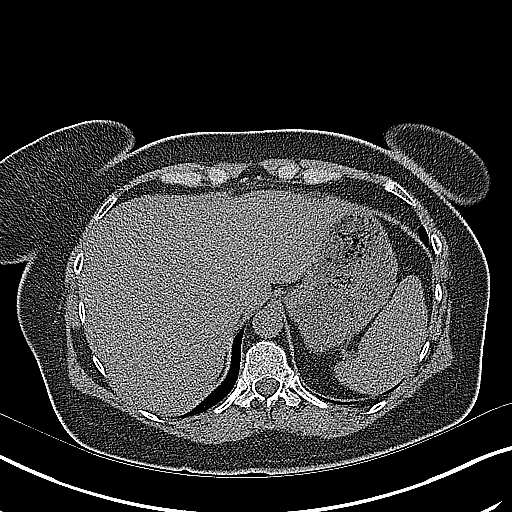
[im 7/32  soft-tissue]
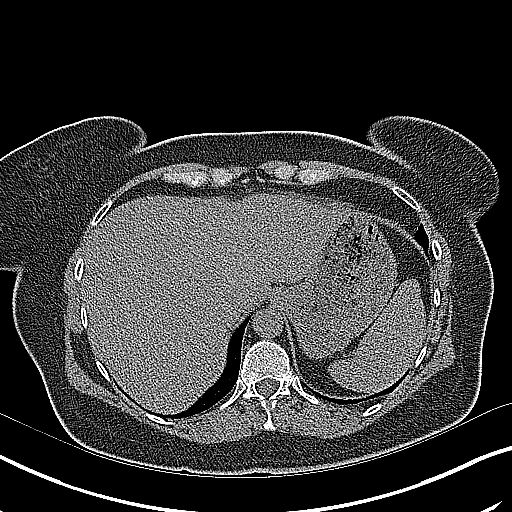
[im 9/32  soft-tissue]
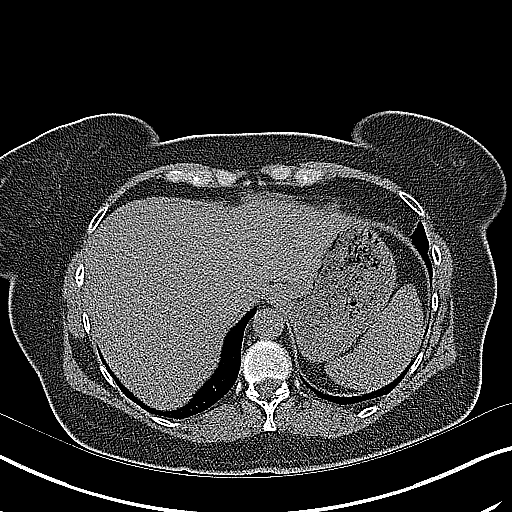
[im 11/32  soft-tissue]
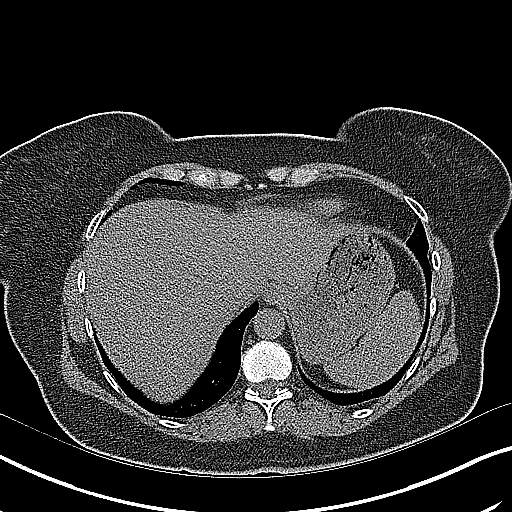
[im 13/32  soft-tissue]
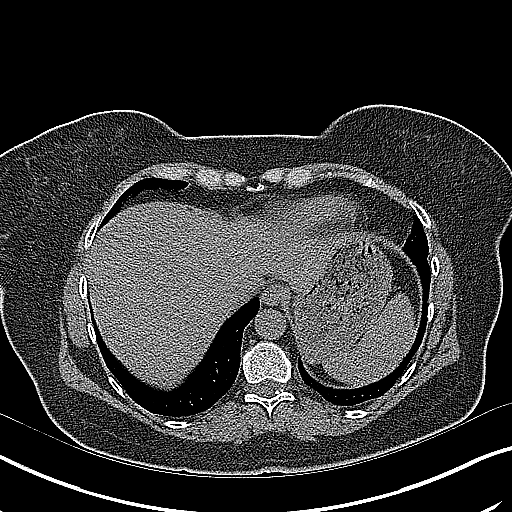
[im 15/32  soft-tissue]
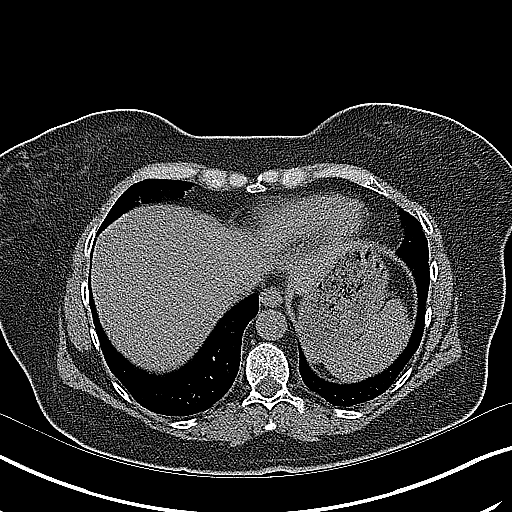
[im 18/32  soft-tissue]
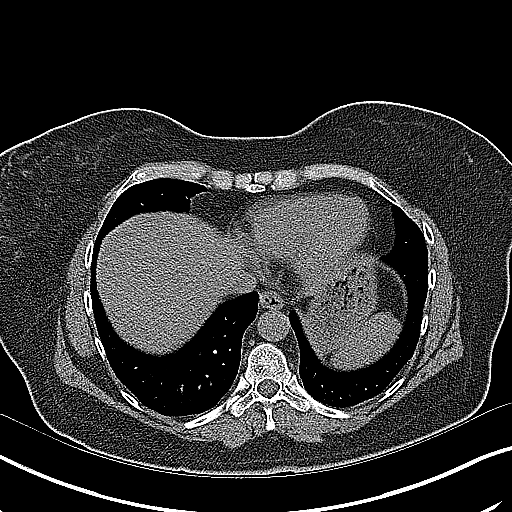
[im 20/32  soft-tissue]
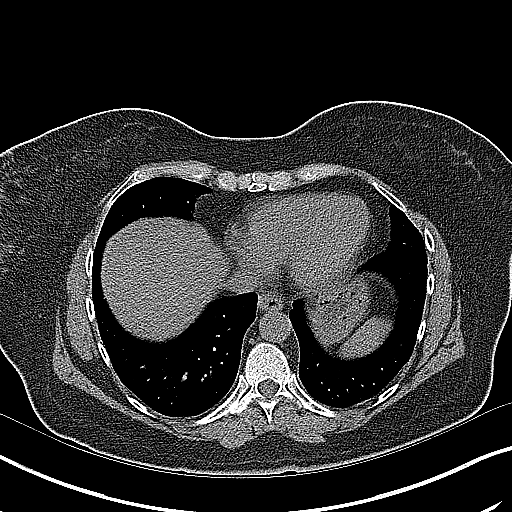
[im 20/32  bone]
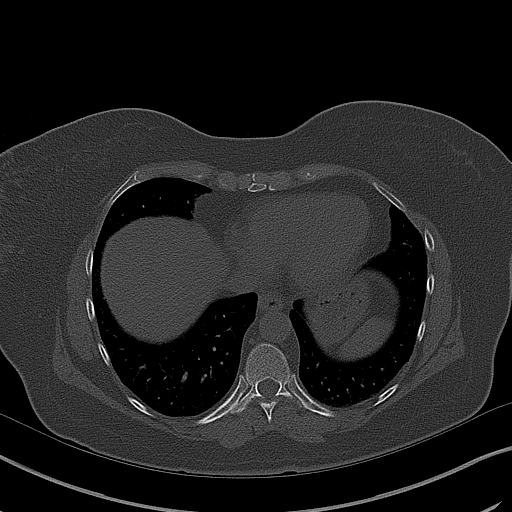
[im 22/32  soft-tissue]
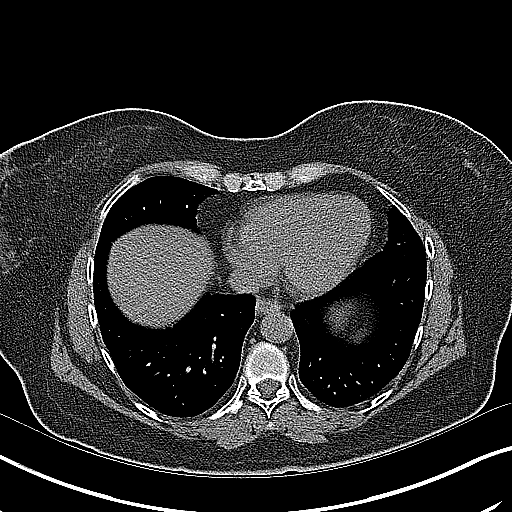
[im 24/32  soft-tissue]
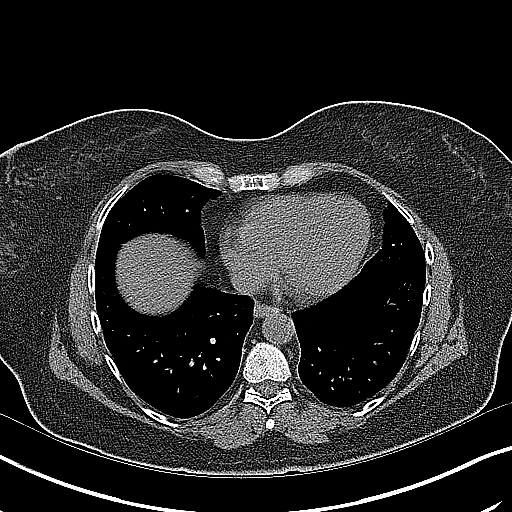
[im 26/32  soft-tissue]
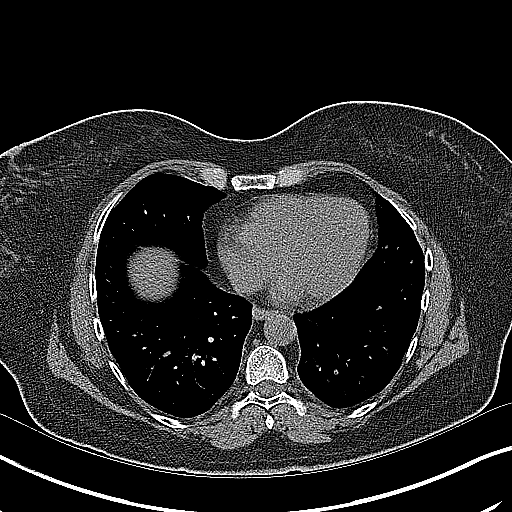
[im 28/32  soft-tissue]
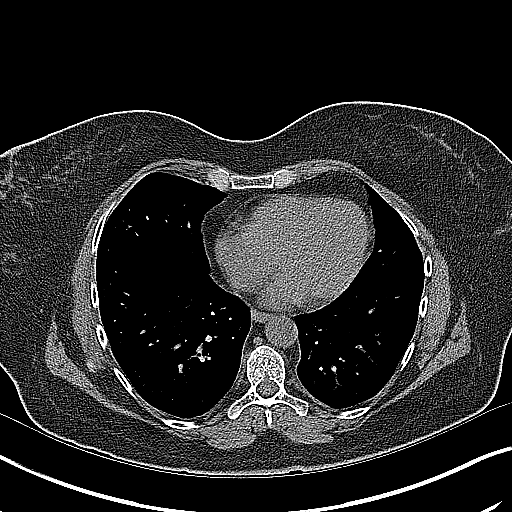
[im 30/32  soft-tissue]
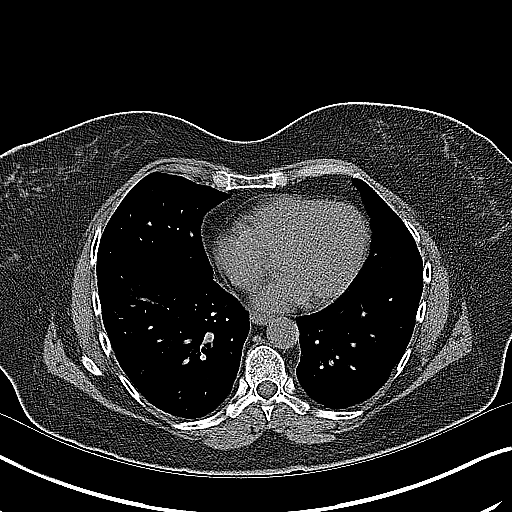

[17 of 46 positions shown; findings below may reference images not displayed]

FINDINGS: Lower chest: No acute abnormality.

Hepatobiliary: No focal liver abnormality is seen. Status post
cholecystectomy. No biliary dilatation.

Pancreas: Unremarkable. No pancreatic ductal dilatation or
surrounding inflammatory changes.

Spleen: Normal in size without focal abnormality.

Adrenals/Urinary Tract: Adrenal glands are unremarkable. Right
kidney punctate interpolar nonobstructive nephrolithiasis. Kidneys
are otherwise normal, without renal calculi, focal lesion, or
hydronephrosis. Bladder is unremarkable.

Stomach/Bowel: Stomach is within normal limits. Appendix appears
normal. No evidence of bowel wall thickening, distention, or
inflammatory changes.

Vascular/Lymphatic: No significant vascular findings are present. No
enlarged abdominal or pelvic lymph nodes.

Reproductive: Stable 14 mm anterior uterine myoma. No adnexal lesion
identified.

Other: No abdominal wall hernia or abnormality. No abdominopelvic
ascites.

Musculoskeletal: No acute or significant osseous findings.
IMPRESSION: 1. No hydronephrosis or obstructive uropathy.
2. Right kidney punctate interpolar nonobstructive nephrolithiasis.
3. Stable small uterine myoma.  Cholecystectomy.

By: Anje Gaha M.D.

## 2018-02-23 ENCOUNTER — Ambulatory Visit: Payer: Self-pay | Admitting: Psychiatry

## 2018-03-01 NOTE — Progress Notes (Signed)
Referring-Sara Spencer  PA-C Reason for referral-CP  HPI: 48 year old female for evaluation of chest pain at request of Roe Coombs, PA-C.  Monitor February 2011 no significant arrhythmia. Nuclear study March 2011 with normal perfusion.  Echocardiogram June 2013 showed normal LV function. CTA September 2017 showed no aortic dissection, no aneurysm and no calcific atherosclerotic disease.  No pulmonary embolus.  Seen in the emergency room January 10, 2018 with chest pain.  Troponins normal.  Hemoglobin 12.6.  White blood cell count 14.5.  Patient states that when she was seen in the emergency room she had pain in her left breast area radiating to her left back.  There is also some radiation to her left shoulder.  Not pleuritic, positional or related to food.  Lasted 2 hours and resolved.  There was some dyspnea and mild nausea but no diaphoresis.  She has otherwise no exertional chest pain.  She has dyspnea with more vigorous activities but not routine activities.  No orthopnea, PND or pedal edema.  Occasional brief palpitations not sustained.  No syncope.  Cardiology asked to evaluate.  Current Outpatient Medications  Medication Sig Dispense Refill  . ALPRAZolam (XANAX) 0.25 MG tablet Take 0.25 mg by mouth 3 (three) times daily as needed for anxiety.     Marland Kitchen amitriptyline (ELAVIL) 10 MG tablet Take 10 mg by mouth at bedtime.    . cyclobenzaprine (FLEXERIL) 10 MG tablet Take 1 tablet (10 mg total) by mouth 3 (three) times daily as needed for muscle spasms. 30 tablet 2  . dexlansoprazole (DEXILANT) 60 MG capsule Take 60 mg by mouth every morning.    . LO LOESTRIN FE 1 MG-10 MCG / 10 MCG tablet Take 1 tablet by mouth every morning.     Marland Kitchen OVER THE COUNTER MEDICATION Take 1 capsule by mouth daily.    Marland Kitchen OVER THE COUNTER MEDICATION at bedtime.    . traMADol (ULTRAM) 50 MG tablet Take 1 tablet (50 mg total) by mouth every 6 (six) hours as needed for severe pain. 15 tablet 0   No current  facility-administered medications for this visit.     Allergies  Allergen Reactions  . Diazepam Palpitations    Tachycardia  (intolerance)     Past Medical History:  Diagnosis Date  . Anxiety   . Arrhythmia    Mitral valve regurgitation per pt- OV Dr Chancy Milroy, cardio 6/13- states had eccho and EKG  . Depression   . Family history of adverse reaction to anesthesia    don't know, she was adopted  . Fibromyalgia   . GERD (gastroesophageal reflux disease)   . History of hiatal hernia   . History of kidney stones   . Mitral valve regurgitation   . Pneumonia   . Rosacea     Past Surgical History:  Procedure Laterality Date  . CHOLECYSTECTOMY N/A 01/02/2015   Procedure: LAPAROSCOPIC CHOLECYSTECTOMY WITH INTRAOPERATIVE CHOLANGIOGRAM;  Surgeon: Stark Klein, MD;  Location: [facility];  Service: General;  Laterality: N/A;  . COLONOSCOPY  03/15/2011   NORMAL  . CRYOTHERAPY     CERVIX  . FRACTURE SURGERY     2013  . JOINT REPLACEMENT Right    partial   . KNEE ARTHROSCOPY  02/03/2012   Procedure: ARTHROSCOPY KNEE;  Surgeon: Johnn Hai, MD;  [facility];  Service: Orthopedics;  Laterality: Right;  Right Knee Arthroscopy with Manipulation Under Anesthesia/Evaluation Under Anesthesia/Debridement  . LITHOTRIPSY    . ORIF PATELLA  07/22/2011   Procedure:  OPEN REDUCTION INTERNAL (ORIF) FIXATION PATELLA;  Surgeon: Johnn Hai, MD;  [facility];  Service: Orthopedics;  Laterality: Right;  . PATELLA-FEMORAL ARTHROPLASTY Right 01/02/2016   Procedure: RIGHT KNEE PATELLA-FEMORAL ARTHROPLASTY;  Surgeon: Susa Day, MD;  [facility];  Service: Orthopedics;  Laterality: Right;  Requests 2.5 hrs  . UPPER GI ENDOSCOPY  2016  . WISDOM TOOTH EXTRACTION      Social History   Socioeconomic History  . Marital status: Married    Spouse name: Not on file  . Number of children: 0  . Years of education: Not on file  . Highest education level: Not on file  Occupational History  .  Occupation: Financial trader with [employer]  Social Needs  . Financial resource strain: Not on file  . Food insecurity:    Worry: Not on file    Inability: Not on file  . Transportation needs:    Medical: Not on file    Non-medical: Not on file  Tobacco Use  . Smoking status: Former Smoker    Years: 15.00    Types: Cigarettes    Last attempt to quit: 07/21/1998    Years since quitting: 19.6  . Smokeless tobacco: Never Used  Substance and Sexual Activity  . Alcohol use: Yes    Alcohol/week: 0.0 standard drinks    Comment: socially  . Drug use: No  . Sexual activity: Not Currently  Lifestyle  . Physical activity:    Days per week: Not on file    Minutes per session: Not on file  . Stress: Not on file  Relationships  . Social connections:    Talks on phone: Not on file    Gets together: Not on file    Attends religious service: Not on file    Active member of club or organization: Not on file    Attends meetings of clubs or organizations: Not on file    Relationship status: Not on file  . Intimate partner violence:    Fear of current or ex partner: Not on file    Emotionally abused: Not on file    Physically abused: Not on file    Forced sexual activity: Not on file  Other Topics Concern  . Not on file  Social History Narrative  . Not on file    Family History  Adopted: Yes  Problem Relation Age of Onset  . Melanoma Mother   . CAD Father     ROS: no fevers or chills, productive cough, hemoptysis, dysphasia, odynophagia, melena, hematochezia, dysuria, hematuria, rash, seizure activity, orthopnea, PND, pedal edema, claudication. Remaining systems are negative.  Physical Exam:   Blood pressure 114/70, pulse 95, height 4' 11.5" (1.511 m), weight 166 lb (75.3 kg).  General:  Well developed/well nourished in NAD Skin warm/dry Patient not depressed No peripheral clubbing Back-normal HEENT-normal/normal eyelids Neck supple/normal carotid upstroke bilaterally; no  bruits; no JVD; no thyromegaly chest - CTA/ normal expansion CV - RRR/normal S1 and S2; no murmurs, rubs or gallops;  PMI nondisplaced Abdomen -NT/ND, no HSM, no mass, + bowel sounds, no bruit 2+ femoral pulses, no bruits Ext-no edema, chords, 2+ DP Neuro-grossly nonfocal  ECG -January 10, 2018-sinus rhythm with nonspecific ST changes.  Personally reviewed  A/P  1 chest pain-symptoms are somewhat atypical.  Troponins were normal in the emergency room.  Electrocardiogram with nonspecific changes.  I will arrange a stress echocardiogram for risk stratification.  She also has some dyspnea on exertion and echocardiogram will  help quantify LV function.  2 palpitations-brief palpitations that are not particularly bothersome at this point.  Can consider repeat monitor in the future if they worsen.  3 Fibromyalgia-per primary care.  Kirk Ruths, MD

## 2018-03-06 ENCOUNTER — Ambulatory Visit: Payer: BC Managed Care – PPO | Admitting: Cardiology

## 2018-03-06 ENCOUNTER — Encounter: Payer: Self-pay | Admitting: Cardiology

## 2018-03-06 VITALS — BP 114/70 | HR 95 | Ht 59.5 in | Wt 166.0 lb

## 2018-03-06 DIAGNOSIS — R0609 Other forms of dyspnea: Secondary | ICD-10-CM

## 2018-03-06 DIAGNOSIS — R0600 Dyspnea, unspecified: Secondary | ICD-10-CM

## 2018-03-06 DIAGNOSIS — R072 Precordial pain: Secondary | ICD-10-CM

## 2018-03-06 NOTE — Patient Instructions (Signed)
Medication Instructions:  NO CHANGE If you need a refill on your cardiac medications before your next appointment, please call your pharmacy.   Lab work: If you have labs (blood work) drawn today and your tests are completely normal, you will receive your results only by: Marland Kitchen MyChart Message (if you have MyChart) OR . A paper copy in the mail If you have any lab test that is abnormal or we need to change your treatment, we will call you to review the results.  Testing/Procedures: Your physician has requested that you have a stress echocardiogram. For further information please visit [email or web address]. Please follow instruction sheet as given.[place]    Follow-Up: At [facility], you and your health needs are our priority.  As part of our continuing mission to provide you with exceptional heart care, we have created designated Provider Care Teams.  These Care Teams include your primary Cardiologist (physician) and Advanced Practice Providers (APPs -  Physician Assistants and Nurse Practitioners) who all work together to provide you with the care you need, when you need it. Your physician recommends that you schedule a follow-up appointment in: AS NEEDED PENDING TEST RESULTS

## 2018-03-08 ENCOUNTER — Encounter: Payer: Self-pay | Admitting: Psychiatry

## 2018-03-08 ENCOUNTER — Ambulatory Visit (INDEPENDENT_AMBULATORY_CARE_PROVIDER_SITE_OTHER): Payer: BC Managed Care – PPO | Admitting: Psychiatry

## 2018-03-08 VITALS — BP 138/92 | HR 101 | Ht 59.5 in | Wt 160.0 lb

## 2018-03-08 DIAGNOSIS — F341 Dysthymic disorder: Secondary | ICD-10-CM

## 2018-03-08 DIAGNOSIS — F411 Generalized anxiety disorder: Secondary | ICD-10-CM

## 2018-03-08 MED ORDER — DULOXETINE HCL 20 MG PO CPEP: 20.0000 mg | ORAL_CAPSULE | Freq: Every day | ORAL | 1 refills | Status: DC

## 2018-03-08 NOTE — Progress Notes (Signed)
[facility] MD/PA/NP Initial Note  03/09/2018 8:54 AM Sarah Cobb  MRN:  [id number]  Chief Complaint:  Anxiety, Depression- "I have anxiety and depression."   HPI: Patient is a 48 year old female who presents for initial evaluation for management of anxiety and depression.  She reports longstanding anxiety and worry. She reports "I have always over thought things" and her mother called her a "worry wort." She reports that she continues to have worry, rumination, and catastrophic thinking. Reports having some muscle tension and GI s/s with worry. She reports that she has had some episodes of acute anxiety where she feels that she has to leave and occasionally will have some mild SOB and increased heart rate. She reports that it often occurs without apparent trigger. Reports that these are infrequent and that it does not interfere with function.  She reports that she does like large crowds but it does not interfere with her doing things she enjoys. Reports that she feels "awkward" at social events. She reports that she has had periods of checking behaviors and at times it will cause her to be late. Reports possible intrusive thoughts. Denies fears of contamination/excessive hand washing. Reports irritation with her husband when he does not do things a certain way that she likes. She reports that anxiety is "never not there" and that level of severity fluctuates.   She reports that she first experienced depression in middle school and reports that depression has not fully remitted and it will periodically worsen. Reports persistent sad mood. She reports frequent irritability and that things irritate her easily. Reports low energy and that she often feels exhausted at the end of the day. She reports that motivation is adequate for work responsibilities. She reports motivation is low towards house work and "that is not how I was raised." Sleep is "not great" and reports interrupted at times due to dog  barking and other factors. Reports that she had difficulty falling asleep in the past "but not lately." Estimates sleeping 5-6 hours on average. Reports appetite is ok and denies any recent fluctuations in weight. She reports she can concentrate at work but notices she has not wanted to read as much due to difficulty concentrating. She enjoys going to plays. She reports that she likes the idea of traveling but not the logistics of it. Reports vague passive death wishes. Denies current or past SI. Denies SIB.  Denies periods of decreased need for sleep, excessive energy, increased goal-directed activity, impulsive or risky behavior. Reports some occasional over-spending but denies buying anything she did not like or use.   Denies AH, VH, or paranoia.   Born in [place] and was adopted. Father worked for [employer] and they moved every 2-3 years with his work "up and down the [employer]." Has an older brother and a younger brother and neither are biologically related. Older brother is 68 years older and he was sent to boarding school when she was 74 yo. Both parents deceased. Father died when she was 12 and mother died when pt was 51 yo. Father had heart disease and died during surgery. Mother had melanoma and lost her eye and then had recurrence of cancer, went into remission, and then later died of cancer. Older brother has had MI and cardiac surgery. Both biological parents are deceased. Attempted to locate her biological mother and "by the time I found her I was deceased." Married x 8 years and that things are "good." Reports that husband is also experiencing some depression after  the death of his mother 2 years ago.  She was married previously and divorced. She does not have any children and neither does her husband. Lives with husband, one dog, and 4 cats. Reports husband is primary support. Has a bachelor's degree. Works as Education officer, environmental at [employer] and was previously a Engineer, manufacturing systems. She  works with [employer]. Denies any h/o abuse or mistreatment. Reports that an ex-boyfriend was an alcoholic and this was stressful for her. Reports the loss of one of her cats was extremely distressing since her cats wound dehisced after surgery. Had miscarriage. [place] her [place], however this has been difficult logistically.   Reports seeing a couples counselor a few months ago. Reports that it has been a few years since she has seen an individual therapist. Reports seeing various therapists in the past. Reports that she saw a psychiatrist in [place]. Denies past psychiatric hospitalization.   Past Psychiatric Medication Trials: Reports that she has some med sensitivity.  Diazepam- Palpitations Xanax- uses only prn when she is unable to sleep. Reports that she has not taken Xanax during the day Amitriptyline- Taken 6-8 months and thinks it helps for insomnia. Unsure if it has helped for pain and has not noticed improvement in mood.  Prozac- Had adverse effects and took briefly Paxil- Adverse effects Effexor XR- Took for several years and had "brain zaps." Had severe discontinuation with d/c and missed doses. Does not recall response.  Pristiq- Worked well for depression for 6 weeks and then developed severe itching and had to stop.  Trazodone- Prescribed but did not take.   Visit Diagnosis:    ICD-10-CM   1. GAD (generalized anxiety disorder) F41.1 DULoxetine (CYMBALTA) 20 MG capsule  2. 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 Anxiety   . Arrhythmia    Mitral valve regurgitation per pt- OV Dr Chancy Milroy, cardio 6/13- states had eccho and EKG  . Depression   . Family history of adverse reaction to anesthesia    don't know, she was adopted  . Fibromyalgia   . GERD (gastroesophageal reflux disease)   . History of hiatal hernia   .  History of kidney stones   . Mitral valve regurgitation   . Pneumonia   . Rosacea     Past Surgical History:  Procedure Laterality Date  . CHOLECYSTECTOMY N/A 01/02/2015   Procedure: LAPAROSCOPIC CHOLECYSTECTOMY WITH INTRAOPERATIVE CHOLANGIOGRAM;  Surgeon: Stark Klein, MD;  Location: [facility];  Service: General;  Laterality: N/A;  . COLONOSCOPY  03/15/2011   NORMAL  . CRYOTHERAPY     CERVIX  . FRACTURE SURGERY     2013  . JOINT REPLACEMENT Right    partial   . KNEE ARTHROSCOPY  02/03/2012   Procedure: ARTHROSCOPY KNEE;  Surgeon: Johnn Hai, MD;  Location: [facility];  Service: Orthopedics;  Laterality: Right;  Right Knee Arthroscopy with Manipulation Under Anesthesia/Evaluation Under Anesthesia/Debridement  . LITHOTRIPSY    . ORIF PATELLA  07/22/2011   Procedure: OPEN REDUCTION INTERNAL (ORIF) FIXATION PATELLA;  Surgeon: Johnn Hai, MD;  Location: [facility];  Service: Orthopedics;  Laterality: Right;  . PATELLA-FEMORAL ARTHROPLASTY Right 01/02/2016   Procedure: RIGHT KNEE PATELLA-FEMORAL ARTHROPLASTY;  Surgeon: Susa Day, MD;  Location: [facility];  Service: Orthopedics;  Laterality: Right;  Requests 2.5 hrs  . UPPER GI ENDOSCOPY  2016  .  WISDOM TOOTH EXTRACTION       Family History:  Family History  Adopted: Yes  Family history unknown: Yes    Social History:  Social History   Socioeconomic History  . Marital status: Married    Spouse name: Not on file  . Number of children: 0  . Years of education: Not on file  . Highest education level: Not on file  Occupational History  . Occupation: Financial trader with [employer]  Social Needs  . Financial resource strain: Not on file  . Food insecurity:    Worry: Not on file    Inability: Not on file  . Transportation needs:    Medical: Not on file    Non-medical: Not on file  Tobacco Use  . Smoking status: Former Smoker    Years: 15.00    Types: Cigarettes    Last attempt to quit: 07/21/1998    Years since quitting:  19.6  . Smokeless tobacco: Never Used  Substance and Sexual Activity  . Alcohol use: Yes    Alcohol/week: 0.0 standard drinks    Comment: socially  . Drug use: No  . Sexual activity: Not Currently  Lifestyle  . Physical activity:    Days per week: Not on file    Minutes per session: Not on file  . Stress: Not on file  Relationships  . Social connections:    Talks on phone: Not on file    Gets together: Not on file    Attends religious service: Not on file    Active member of club or organization: Not on file    Attends meetings of clubs or organizations: Not on file    Relationship status: Not on file  Other Topics Concern  . Not on file  Social History Narrative  . Not on file    Allergies:  Allergies  Allergen Reactions  . Diazepam Palpitations    Tachycardia  (intolerance)  . Desvenlafaxine     Metabolic Disorder Labs: Lab Results  Component Value Date   HGBA1C 5.6 01/07/2016   MPG 114 01/07/2016   No results found for: PROLACTIN No results found for: CHOL, TRIG, HDL, CHOLHDL, VLDL, LDLCALC No results found for: TSH  Therapeutic Level Labs: No results found for: LITHIUM No results found for: VALPROATE No components found for:  CBMZ  Current Medications: Current Outpatient Medications  Medication Sig Dispense Refill  . ALPRAZolam (XANAX) 0.25 MG tablet Take 0.25 mg by mouth 3 (three) times daily as needed for anxiety.     Marland Kitchen amitriptyline (ELAVIL) 10 MG tablet Take 10 mg by mouth at bedtime.    . APPLE CIDER VINEGAR PO Take by mouth.    . cyclobenzaprine (FLEXERIL) 10 MG tablet Take 1 tablet (10 mg total) by mouth 3 (three) times daily as needed for muscle spasms. 30 tablet 2  . dexlansoprazole (DEXILANT) 60 MG capsule Take 60 mg by mouth every morning.    . LO LOESTRIN FE 1 MG-10 MCG / 10 MCG tablet Take 1 tablet by mouth every morning.     . Multiple Vitamin (MULTIVITAMIN) tablet Take 1 tablet by mouth daily.    Marland Kitchen OVER THE COUNTER MEDICATION Take 1  capsule by mouth daily.    . Probiotic Product (PROBIOTIC PO) Take by mouth.    . traMADol (ULTRAM) 50 MG tablet Take 1 tablet (50 mg total) by mouth every 6 (six) hours as needed for severe pain. 15 tablet 0  . DULoxetine (CYMBALTA) 20 MG capsule  Take 1 capsule (20 mg total) by mouth daily for 30 days. 30 capsule 1  . OVER THE COUNTER MEDICATION at bedtime.     No current facility-administered medications for this visit.     Medication Side Effects: none  Orders placed this visit:  No orders of the defined types were placed in this encounter.   Psychiatric Specialty Exam:  Review of Systems  Constitutional: Positive for malaise/fatigue.  Cardiovascular: Positive for palpitations.  Musculoskeletal: Positive for back pain and joint pain.  Skin: Positive for itching.    Blood pressure (!) 138/92, pulse (!) 101, height 4' 11.5" (1.511 m), weight 160 lb (72.6 kg).Body mass index is 31.78 kg/m.  General Appearance: Casual  Eye Contact:  Good  Speech:  Clear and Coherent  Volume:  Normal  Mood:  Depressed  Affect:  Depressed  Thought Process:  Coherent  Orientation:  Full (Time, Place, and Person)  Thought Content: Logical   Suicidal Thoughts:  No  Homicidal Thoughts:  No  Memory:  WNL  Judgement:  Good  Insight:  Good  Psychomotor Activity:  Normal  Concentration:  Concentration: Good  Recall:  Good  Fund of Knowledge: Good  Language: Good  Assets:  Communication Skills Desire for Improvement  ADL's:  Intact  Cognition: WNL  Prognosis:  Good   Receiving Psychotherapy: No   Treatment Plan/Recommendations: Patient seen for 60 minutes and greater than 50% of visit spent counseling patient regarding depression and anxiety, to include treatment guidelines and proposed mechanism of action of different classes of medications used to treat anxiety and depression.  Reviewed potential benefits, risks, and side effects of several treatment options to include increasing Elavil or  adding Cymbalta for anxiety, depression, and possibly also helping with pain due to fibromyalgia.  Patient agrees to trial of duloxetine 20 mg daily for anxiety and depression.  Will start with low dose since patient describes history of medication sensitivity.  Discussed that further titration of duloxetine may be needed for adequate control of signs and symptoms. Patient advised to contact office with any questions, adverse effects, or acute worsening in signs and symptoms. Patient to follow-up in 4 weeks or sooner if clinically indicated.    Thayer Headings, PMHNP

## 2018-03-09 ENCOUNTER — Telehealth (HOSPITAL_COMMUNITY): Payer: Self-pay | Admitting: *Deleted

## 2018-03-09 NOTE — Telephone Encounter (Signed)
Left message on voicemail per DPR in reference to upcoming appointment scheduled on 03/15/18 at 3:00 with detailed instructions given per Stress Test Requisition Sheet for the test. LM to arrive 30 minutes early, and that it is imperative to arrive on time for appointment to keep from having the test rescheduled. If you need to cancel or reschedule your appointment, please call the office within 24 hours of your appointment. Failure to do so may result in a cancellation of your appointment, and a $50 no show fee. Phone number given for call back for any questions. [name]

## 2018-03-15 ENCOUNTER — Ambulatory Visit (HOSPITAL_COMMUNITY): Payer: BC Managed Care – PPO | Attending: Cardiovascular Disease

## 2018-03-15 ENCOUNTER — Ambulatory Visit (HOSPITAL_COMMUNITY): Payer: BC Managed Care – PPO

## 2018-03-15 ENCOUNTER — Other Ambulatory Visit: Payer: Self-pay

## 2018-03-15 DIAGNOSIS — R072 Precordial pain: Secondary | ICD-10-CM | POA: Diagnosis not present

## 2018-03-27 ENCOUNTER — Telehealth: Payer: Self-pay | Admitting: Psychiatry

## 2018-03-27 ENCOUNTER — Other Ambulatory Visit: Payer: Self-pay

## 2018-03-27 DIAGNOSIS — F411 Generalized anxiety disorder: Secondary | ICD-10-CM

## 2018-03-27 DIAGNOSIS — F341 Dysthymic disorder: Secondary | ICD-10-CM

## 2018-03-27 MED ORDER — DULOXETINE HCL 20 MG PO CPEP: ORAL_CAPSULE | ORAL | 0 refills | Status: DC

## 2018-03-27 NOTE — Telephone Encounter (Signed)
Patient would like to get refill on cymbalta before 03/31 in case not able to get medication to be sent to [facility] on [address].

## 2018-03-27 NOTE — Telephone Encounter (Signed)
Will submit to be put on file in case pt not able to be seen.

## 2018-04-05 ENCOUNTER — Ambulatory Visit: Payer: BC Managed Care – PPO | Admitting: Psychiatry

## 2018-04-19 ENCOUNTER — Ambulatory Visit: Payer: BC Managed Care – PPO | Admitting: Psychiatry

## 2018-05-04 ENCOUNTER — Other Ambulatory Visit: Payer: Self-pay

## 2018-05-04 ENCOUNTER — Encounter: Payer: Self-pay | Admitting: Psychiatry

## 2018-05-04 ENCOUNTER — Ambulatory Visit (INDEPENDENT_AMBULATORY_CARE_PROVIDER_SITE_OTHER): Payer: BC Managed Care – PPO | Admitting: Psychiatry

## 2018-05-04 DIAGNOSIS — F411 Generalized anxiety disorder: Secondary | ICD-10-CM | POA: Diagnosis not present

## 2018-05-04 DIAGNOSIS — F341 Dysthymic disorder: Secondary | ICD-10-CM | POA: Diagnosis not present

## 2018-05-04 MED ORDER — DULOXETINE HCL 30 MG PO CPEP: 30.0000 mg | ORAL_CAPSULE | Freq: Every day | ORAL | 1 refills | Status: DC

## 2018-05-04 NOTE — Progress Notes (Signed)
Sarah Cobb [id number] [birth year] 48 y.o.  Virtual Visit via Telephone Note  I connected with@ on 05/04/18 at  2:30 PM EDT by telephone and verified that I am speaking with the correct person using two identifiers.   I discussed the limitations, risks, security and privacy concerns of performing an evaluation and management service by telephone and the availability of in person appointments. I also discussed with the patient that there may be a patient responsible charge related to this service. The patient expressed understanding and agreed to proceed.   I discussed the assessment and treatment plan with the patient. The patient was provided an opportunity to ask questions and all were answered. The patient agreed with the plan and demonstrated an understanding of the instructions.   The patient was advised to call back or seek an in-person evaluation if the symptoms worsen or if the condition fails to improve as anticipated.  I provided 30 minutes of non-face-to-face time during this encounter.  The patient was located at home.  The provider was located at home.   Thayer Headings, PMHNP   Subjective:   Patient ID:  Sarah Cobb is a 48 y.o. (DOB 06-23-1970) female.  Chief Complaint:  Chief Complaint  Patient presents with  . Depression  . Anxiety    HPI Sarah Cobb presents for follow-up of anxiety and depression. She reports that she is tolerating Cymbalta. She feels that her mood is somewhat less depressed since starting Cymbalta. Reports that anxiety is less frequent with Cymbalta. Has noticed maybe a slight improvement in chronic pain.   She reports that she has had some anxiety, particularly related to the pandemic and being required to continue to go to work. She had a friend commit suicide about 2 weeks ago. She reports that she has been grieving the loss of her friend. Reports that she has some persistent mild depression. She reports that she  has occ difficulty with middle of the night awakenings. Denies nightmares. Sleep interrupted due to night sweats. Appetite has been "fine." She reports that she is always tired but is able "to push on through it." Concentration is adequate. Denies SI.   Past Psychiatric Medication Trials: Reports that she has some med sensitivity.  Diazepam- Palpitations Xanax- uses only prn when she is unable to sleep. Reports that she has not taken Xanax during the day Amitriptyline- Taken 6-8 months and thinks it helps for insomnia. Unsure if it has helped for pain and has not noticed improvement in mood.  Prozac- Had adverse effects and took briefly Paxil- Adverse effects Effexor XR- Took for several years and had "brain zaps." Had severe discontinuation with d/c and missed doses. Does not recall response.  Pristiq- Worked well for depression for 6 weeks and then developed severe itching and had to stop.  Trazodone- Prescribed but did not take.  Review of Systems:  Review of Systems  Gastrointestinal: Negative.   Musculoskeletal: Positive for arthralgias, back pain and myalgias. Negative for gait problem.  Neurological: Negative for tremors.  Psychiatric/Behavioral:       Please refer to HPI    Medications: I have reviewed the patient's current medications.  Current Outpatient Medications  Medication Sig Dispense Refill  . ALPRAZolam (XANAX) 0.25 MG tablet Take 0.25 mg by mouth 3 (three) times daily as needed for anxiety.     Marland Kitchen amitriptyline (ELAVIL) 10 MG tablet Take 10 mg by mouth at bedtime.    . APPLE CIDER VINEGAR PO Take by mouth.    Marland Kitchen  cyclobenzaprine (FLEXERIL) 10 MG tablet Take 1 tablet (10 mg total) by mouth 3 (three) times daily as needed for muscle spasms. 30 tablet 2  . dexlansoprazole (DEXILANT) 60 MG capsule Take 60 mg by mouth every morning.    . LO LOESTRIN FE 1 MG-10 MCG / 10 MCG tablet Take 1 tablet by mouth every morning.     . Multiple Vitamin (MULTIVITAMIN) tablet Take 1  tablet by mouth daily.    Marland Kitchen OVER THE COUNTER MEDICATION Take 1 capsule by mouth daily.    . Probiotic Product (PROBIOTIC PO) Take by mouth.    . traMADol (ULTRAM) 50 MG tablet Take 1 tablet (50 mg total) by mouth every 6 (six) hours as needed for severe pain. 15 tablet 0  . DULoxetine (CYMBALTA) 30 MG capsule Take 1 capsule (30 mg total) by mouth daily for 30 days. 30 capsule 1  . OVER THE COUNTER MEDICATION at bedtime.     No current facility-administered medications for this visit.     Medication Side Effects: None  Allergies:  Allergies  Allergen Reactions  . Diazepam Palpitations    Tachycardia  (intolerance)  . Desvenlafaxine     Past Medical History:  Diagnosis Date  . Anxiety   . Arrhythmia    Mitral valve regurgitation per pt- OV Dr Chancy Milroy, cardio 6/13- states had eccho and EKG  . Depression   . Family history of adverse reaction to anesthesia    don't know, she was adopted  . Fibromyalgia   . GERD (gastroesophageal reflux disease)   . History of hiatal hernia   . History of kidney stones   . Mitral valve regurgitation   . Pneumonia   . Rosacea     Family History  Adopted: Yes  Family history unknown: Yes    Social History   Socioeconomic History  . Marital status: Married    Spouse name: Not on file  . Number of children: 0  . Years of education: Not on file  . Highest education level: Not on file  Occupational History  . Occupation: Financial trader with [employer]  Social Needs  . Financial resource strain: Not on file  . Food insecurity:    Worry: Not on file    Inability: Not on file  . Transportation needs:    Medical: Not on file    Non-medical: Not on file  Tobacco Use  . Smoking status: Former Smoker    Years: 15.00    Types: Cigarettes    Last attempt to quit: 07/21/1998    Years since quitting: 19.8  . Smokeless tobacco: Never Used  Substance and Sexual Activity  . Alcohol use: Yes    Alcohol/week: 0.0 standard drinks    Comment:  socially  . Drug use: No  . Sexual activity: Not Currently  Lifestyle  . Physical activity:    Days per week: Not on file    Minutes per session: Not on file  . Stress: Not on file  Relationships  . Social connections:    Talks on phone: Not on file    Gets together: Not on file    Attends religious service: Not on file    Active member of club or organization: Not on file    Attends meetings of clubs or organizations: Not on file    Relationship status: Not on file  . Intimate partner violence:    Fear of current or ex partner: Not on file    Emotionally abused: Not  on file    Physically abused: Not on file    Forced sexual activity: Not on file  Other Topics Concern  . Not on file  Social History Narrative  . Not on file    Past Medical History, Surgical history, Social history, and Family history were reviewed and updated as appropriate.   Please see review of systems for further details on the patient's review from today.   Objective:   Physical Exam:  There were no vitals taken for this visit.  Physical Exam Neurological:     Mental Status: She is alert and oriented to person, place, and time.     Cranial Nerves: No dysarthria.  Psychiatric:        Attention and Perception: Attention normal.        Mood and Affect: Mood is depressed.        Speech: Speech normal.        Behavior: Behavior is cooperative.        Thought Content: Thought content normal. Thought content is not paranoid or delusional. Thought content does not include homicidal or suicidal ideation. Thought content does not include homicidal or suicidal plan.        Cognition and Memory: Cognition and memory normal.        Judgment: Judgment normal.     Lab Review:     Component Value Date/Time   NA 138 01/10/2018 2049   K 3.7 01/10/2018 2049   CL 105 01/10/2018 2049   CO2 23 01/10/2018 2049   GLUCOSE 134 (H) 01/10/2018 2049   BUN 11 01/10/2018 2049   CREATININE 0.90 01/10/2018 2049    CALCIUM 9.1 01/10/2018 2049   PROT 8.1 09/29/2017 1109   ALBUMIN 4.2 09/29/2017 1109   AST 31 09/29/2017 1109   ALT 38 09/29/2017 1109   ALKPHOS 66 09/29/2017 1109   BILITOT 0.4 09/29/2017 1109   GFRNONAA >60 01/10/2018 2049   GFRAA >60 01/10/2018 2049       Component Value Date/Time   WBC 14.5 (H) 01/10/2018 2049   RBC 4.22 01/10/2018 2049   HGB 12.6 01/10/2018 2049   HCT 38.2 01/10/2018 2049   PLT 326 01/10/2018 2049   MCV 90.5 01/10/2018 2049   MCV 93.9 11/25/2013 1113   MCH 29.9 01/10/2018 2049   MCHC 33.0 01/10/2018 2049   RDW 12.7 01/10/2018 2049   LYMPHSABS 3.1 09/29/2017 1109   MONOABS 0.5 09/29/2017 1109   EOSABS 0.2 09/29/2017 1109   BASOSABS 0.0 09/29/2017 1109    No results found for: POCLITH, LITHIUM   No results found for: PHENYTOIN, PHENOBARB, VALPROATE, CBMZ   .res Assessment: Plan:   Discussed potential benefits, risks, and side effects of increasing Cymbalta to further improve anxiety and depressive signs and symptoms.  Discussed that increase in Cymbalta may also be helpful for her chronic pain.  Patient agrees to increase in Cymbalta. Will increase Cymbalta to 30 mg daily for anxiety and depression. Continue Xanax 0.25 mg 3 times daily as needed anxiety. Continue Elavil 10 mg p.o. nightly as prescribed by medical provider. Patient to follow-up in 4 weeks or sooner if clinically indicated. Patient advised to contact office with any questions, adverse effects, or acute worsening in signs and symptoms.  GAD (generalized anxiety disorder) - Plan: DULoxetine (CYMBALTA) 30 MG capsule  Dysthymic disorder - Plan: DULoxetine (CYMBALTA) 30 MG capsule  Please see After Visit Summary for patient specific instructions.  Future Appointments  Date Time Provider [place]  05/08/2018  4:00 PM Shanon Ace, LCSW [facility] None    No orders of the defined types were placed in this encounter.     -------------------------------

## 2018-05-08 ENCOUNTER — Ambulatory Visit (INDEPENDENT_AMBULATORY_CARE_PROVIDER_SITE_OTHER): Payer: BC Managed Care – PPO | Admitting: Psychiatry

## 2018-05-08 ENCOUNTER — Other Ambulatory Visit: Payer: Self-pay

## 2018-05-08 DIAGNOSIS — F411 Generalized anxiety disorder: Secondary | ICD-10-CM | POA: Diagnosis not present
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
## 2018-05-09 ENCOUNTER — Other Ambulatory Visit: Payer: Self-pay | Admitting: Obstetrics and Gynecology

## 2018-05-09 DIAGNOSIS — R922 Inconclusive mammogram: Secondary | ICD-10-CM

## 2018-05-09 DIAGNOSIS — R9230 Dense breasts, unspecified: Secondary | ICD-10-CM

## 2018-05-19 ENCOUNTER — Ambulatory Visit: Payer: BC Managed Care – PPO

## 2018-05-23 ENCOUNTER — Other Ambulatory Visit: Payer: Self-pay

## 2018-05-23 ENCOUNTER — Ambulatory Visit (INDEPENDENT_AMBULATORY_CARE_PROVIDER_SITE_OTHER): Payer: BC Managed Care – PPO | Admitting: Psychiatry

## 2018-05-23 DIAGNOSIS — F411 Generalized anxiety disorder: Secondary | ICD-10-CM

## 2018-05-23 NOTE — Progress Notes (Addendum)
[facility] Counselor/Therapist Progress Note  Patient ID: Sarah Cobb, MRN: [id number],    Date: 05/23/2018  Time Spent: 60 minutes (4:00pm to 5:00pm)  Treatment Type: Individual Therapy   Virtual Visit Note Connected with patient by a video enabled telemedicine/telehealth application or telephone, with their informed consent, and verified patient privacy and that I am speaking with the correct person using two identifiers. I discussed the limitations, risks, security and privacy concerns of performing psychotherapy and management service by telephone and the availability of in person appointments. I also discussed with the patient that there may be a patient responsible charge related to this service. The patient expressed understanding and agreed to proceed. I discussed the treatment planning with the patient. The patient was provided an opportunity to ask questions and all were answered. The patient agreed with the plan and demonstrated an understanding of the instructions. The patient was advised to call  our office if  symptoms worsen or feel they are in a crisis state and need immediate contact.   Therapist Location: home Patient Location: home   Reported Symptoms: anxiety, some unresolved grief re: adoption issues  Mental Status Exam:  Appearance:   N/A   (teletherapy)     Behavior:  Sharing  Motor:  N/A     (teletherapy)  Speech/Language:   Clear and Coherent  Affect:  N/A     (teletherapy)  Mood:  anxious  Thought process:  goal directed  Thought content:    WNL  Sensory/Perceptual disturbances:    WNL  Orientation:  oriented to person, place, time/date, situation, day of week, month of year and year  Attention:  Good  Concentration:  Good  Memory:  WNL  Fund of knowledge:   Good  Insight:    Good  Judgment:   Good  Impulse Control:  Good   Risk Assessment: Danger to Self:  No Self-injurious Behavior: No Danger to Others: No Duty to  Warn:no Physical Aggression / Violence:No  Access to Firearms a concern: No  Gang Involvement:No   Subjective:  Patient in today reporting symptoms noted above.  She began talking about adoption issues which "has been a long standing issue for her and one about which she has never really dealt with".  Has been involved in "researching ancestry" and knows who her real parents were and also a brother (who is in [place]), a sister. Lots of questions for her such as why she was adopted out and other siblings were not.  Her adoptive parents were "pretty good to me as much as they could be".  "Things weren't talked about growing up and I had to learn from my friends."  Processed the feelings of "being different from other kids", was never very outgoing, hid as a kid. " I think think being adopted as always affected my self-confidence, ability to trust others, self-esteem. Felt I needed to stay hidden because I was different." " Feeling introspective and sad to some degree."  Talked about her feelings and what would be most helpful to her at this point in trying to achieve some resolution. Plan to follow up on this more next session and clarify goals.  Interventions: Cognitive Behavioral Therapy and Solution-Oriented/Positive Psychology  Diagnosis:   ICD-10-CM   1. GAD (generalized anxiety disorder) F41.1     Plan: Patient to follow through on good overall self-care discussed today.  Next appt in 2 weeks to further clarify more specific goals.  Did not complete that today  as patient needed the talk time to process her current feelings more.  To stay on meds as prescribed by med provider.  Shanon Ace, LCSW

## 2018-05-26 ENCOUNTER — Other Ambulatory Visit: Payer: Self-pay

## 2018-05-26 ENCOUNTER — Ambulatory Visit
Admission: RE | Admit: 2018-05-26 | Discharge: 2018-05-26 | Disposition: A | Discharge status: Home / Self Care | Payer: No Typology Code available for payment source | Source: Ambulatory Visit | Attending: Obstetrics and Gynecology | Admitting: Obstetrics and Gynecology

## 2018-05-26 DIAGNOSIS — R922 Inconclusive mammogram: Secondary | ICD-10-CM

## 2018-05-26 MED ORDER — GADOBUTROL 1 MMOL/ML IV SOLN
8.0000 mL | Freq: Once | INTRAVENOUS | Status: AC | PRN
  Administered 2018-05-26: 8 mL via INTRAVENOUS

## 2018-05-28 ENCOUNTER — Other Ambulatory Visit: Payer: Self-pay | Admitting: Psychiatry

## 2018-05-28 DIAGNOSIS — F341 Dysthymic disorder: Secondary | ICD-10-CM

## 2018-05-28 DIAGNOSIS — F411 Generalized anxiety disorder: Secondary | ICD-10-CM

## 2018-06-06 ENCOUNTER — Other Ambulatory Visit: Payer: Self-pay

## 2018-06-06 ENCOUNTER — Ambulatory Visit (INDEPENDENT_AMBULATORY_CARE_PROVIDER_SITE_OTHER): Payer: BC Managed Care – PPO | Admitting: Psychiatry

## 2018-06-06 DIAGNOSIS — F411 Generalized anxiety disorder: Secondary | ICD-10-CM
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
## 2018-06-09 ENCOUNTER — Ambulatory Visit: Payer: BC Managed Care – PPO | Admitting: Psychiatry

## 2018-06-23 ENCOUNTER — Encounter: Payer: Self-pay | Admitting: Psychiatry

## 2018-06-23 ENCOUNTER — Other Ambulatory Visit: Payer: Self-pay

## 2018-06-23 ENCOUNTER — Ambulatory Visit (INDEPENDENT_AMBULATORY_CARE_PROVIDER_SITE_OTHER): Payer: BC Managed Care – PPO | Admitting: Psychiatry

## 2018-06-23 DIAGNOSIS — F411 Generalized anxiety disorder: Secondary | ICD-10-CM

## 2018-06-23 DIAGNOSIS — F341 Dysthymic disorder: Secondary | ICD-10-CM

## 2018-06-23 NOTE — Progress Notes (Signed)
[name] [id number] [birth year] 48 y.o.  Virtual Visit via Telephone Note  I connected with@ on 06/23/18 at  9:30 AM EDT by telephone and verified that I am speaking with the correct person using two identifiers.   I discussed the limitations, risks, security and privacy concerns of performing an evaluation and management service by telephone and the availability of in person appointments. I also discussed with the patient that there may be a patient responsible charge related to this service. The patient expressed understanding and agreed to proceed.   I discussed the assessment and treatment plan with the patient. The patient was provided an opportunity to ask questions and all were answered. The patient agreed with the plan and demonstrated an understanding of the instructions.   The patient was advised to call back or seek an in-person evaluation if the symptoms worsen or if the condition fails to improve as anticipated.  I provided 15 minutes of non-face-to-face time during this encounter.  The patient was located at home.  The provider was located at [place].   Thayer Headings, PMHNP   Subjective:   Patient ID:  Sarah Cobb is a 48 y.o. (DOB Jun 01, 1970) female.  Chief Complaint:  Chief Complaint  Patient presents with  . Anxiety  . Depression    HPI Sarah Cobb presents for follow-up of depression and anxiety. She reports that she stopped Cymbalta due to severe hot flashes. She reports that she has had some continued to have some anxiety and depression in response to the pandemic. Rates depression a 4 out of 10 with 0= worst depression imaginable. Reports some physical s/s associated with anxiety. Denies any recent panic attacks. Reports that sleep "varies" with ok nights and other nights has middle of the night awakenings. Appetite has been fine. She reports that she is able to accomplish what is required at work but otherwise  energy and motivation are somewhat low. Reports concentration is fair. Reports some difficulty with focusing to read a book. Denies SI.   She reports that she has been working remotely.   Reports that she typically only takes Xanax prn at night when she is unable to sleep.   Past Psychiatric Medication Trials: Reports that she has some med sensitivity.  Diazepam- Palpitations Xanax- uses only prn when she is unable to sleep. Reports that she has not taken Xanax during the day Amitriptyline- Taken 6-8 months and thinks it helps for insomnia. Unsure if it has helped for pain and has not noticed improvement in mood.  Prozac- Had adverse effects and took briefly Paxil- Adverse effects Effexor XR- Took for several years and had "brain zaps." Had severe discontinuation with d/c and missed doses. Does not recall response.  Pristiq- Worked well for depression for 6 weeks and then developed severe itching and had to stop.  Cymbalta- had severe hot flashes.  Trazodone- Prescribed but did not take.   Review of Systems:  Review of Systems  Constitutional: Positive for fatigue.  Musculoskeletal: Negative for gait problem.  Neurological: Negative for tremors.  Psychiatric/Behavioral:       Please refer to HPI    Medications: I have reviewed the patient's current medications.  Current Outpatient Medications  Medication Sig Dispense Refill  . ALPRAZolam (XANAX) 0.25 MG tablet Take 0.25 mg by mouth 3 (three) times daily as needed for anxiety.     Marland Kitchen amitriptyline (ELAVIL) 10 MG tablet Take 10 mg by mouth at bedtime.    . cyclobenzaprine (FLEXERIL) 10  MG tablet Take 1 tablet (10 mg total) by mouth 3 (three) times daily as needed for muscle spasms. 30 tablet 2  . dexlansoprazole (DEXILANT) 60 MG capsule Take 60 mg by mouth every morning.    . LO LOESTRIN FE 1 MG-10 MCG / 10 MCG tablet Take 1 tablet by mouth every morning.     . Multiple Vitamin (MULTIVITAMIN) tablet Take 1 tablet by mouth daily.     Marland Kitchen OVER THE COUNTER MEDICATION Take 1 capsule by mouth daily.    Marland Kitchen OVER THE COUNTER MEDICATION at bedtime.    . Probiotic Product (PROBIOTIC PO) Take by mouth.    . traMADol (ULTRAM) 50 MG tablet Take 1 tablet (50 mg total) by mouth every 6 (six) hours as needed for severe pain. 15 tablet 0   No current facility-administered medications for this visit.     Medication Side Effects: None  Allergies:  Allergies  Allergen Reactions  . Diazepam Palpitations    Tachycardia  (intolerance)  . Desvenlafaxine     Past Medical History:  Diagnosis Date  . Anxiety   . Arrhythmia    Mitral valve regurgitation per pt- OV Dr Chancy Milroy, cardio 6/13- states had eccho and EKG  . Depression   . Family history of adverse reaction to anesthesia    don't know, she was adopted  . Fibromyalgia   . GERD (gastroesophageal reflux disease)   . History of hiatal hernia   . History of kidney stones   . Mitral valve regurgitation   . Pneumonia   . Rosacea     Family History  Adopted: Yes  Family history unknown: Yes    Social History   Socioeconomic History  . Marital status: Married    Spouse name: Not on file  . Number of children: 0  . Years of education: Not on file  . Highest education level: Not on file  Occupational History  . Occupation: Financial trader with [employer]  Social Needs  . Financial resource strain: Not on file  . Food insecurity    Worry: Not on file    Inability: Not on file  . Transportation needs    Medical: Not on file    Non-medical: Not on file  Tobacco Use  . Smoking status: Former Smoker    Years: 15.00    Types: Cigarettes    Quit date: 07/21/1998    Years since quitting: 19.9  . Smokeless tobacco: Never Used  Substance and Sexual Activity  . Alcohol use: Yes    Alcohol/week: 0.0 standard drinks    Comment: socially  . Drug use: No  . Sexual activity: Not Currently  Lifestyle  . Physical activity    Days per week: Not on file    Minutes per  session: Not on file  . Stress: Not on file  Relationships  . Social Herbalist on phone: Not on file    Gets together: Not on file    Attends religious service: Not on file    Active member of club or organization: Not on file    Attends meetings of clubs or organizations: Not on file    Relationship status: Not on file  . Intimate partner violence    Fear of current or ex partner: Not on file    Emotionally abused: Not on file    Physically abused: Not on file    Forced sexual activity: Not on file  Other Topics Concern  . Not on  file  Social History Narrative  . Not on file    Past Medical History, Surgical history, Social history, and Family history were reviewed and updated as appropriate.   Please see review of systems for further details on the patient's review from today.   Objective:   Physical Exam:  There were no vitals taken for this visit.  Physical Exam Constitutional:      General: She is not in acute distress.    Appearance: She is well-developed.  Musculoskeletal:        General: No deformity.  Neurological:     Mental Status: She is alert and oriented to person, place, and time.     Coordination: Coordination normal.  Psychiatric:        Attention and Perception: Attention and perception normal. She does not perceive auditory or visual hallucinations.        Mood and Affect: Mood is anxious and depressed. Affect is not labile, blunt, angry or inappropriate.        Speech: Speech normal.        Behavior: Behavior normal.        Thought Content: Thought content normal. Thought content does not include homicidal or suicidal ideation. Thought content does not include homicidal or suicidal plan.        Cognition and Memory: Cognition and memory normal.        Judgment: Judgment normal.     Comments: Insight intact. No delusions.      Lab Review:     Component Value Date/Time   NA 138 01/10/2018 2049   K 3.7 01/10/2018 2049   CL 105  01/10/2018 2049   CO2 23 01/10/2018 2049   GLUCOSE 134 (H) 01/10/2018 2049   BUN 11 01/10/2018 2049   CREATININE 0.90 01/10/2018 2049   CALCIUM 9.1 01/10/2018 2049   PROT 8.1 09/29/2017 1109   ALBUMIN 4.2 09/29/2017 1109   AST 31 09/29/2017 1109   ALT 38 09/29/2017 1109   ALKPHOS 66 09/29/2017 1109   BILITOT 0.4 09/29/2017 1109   GFRNONAA >60 01/10/2018 2049   GFRAA >60 01/10/2018 2049       Component Value Date/Time   WBC 14.5 (H) 01/10/2018 2049   RBC 4.22 01/10/2018 2049   HGB 12.6 01/10/2018 2049   HCT 38.2 01/10/2018 2049   PLT 326 01/10/2018 2049   MCV 90.5 01/10/2018 2049   MCV 93.9 11/25/2013 1113   MCH 29.9 01/10/2018 2049   MCHC 33.0 01/10/2018 2049   RDW 12.7 01/10/2018 2049   LYMPHSABS 3.1 09/29/2017 1109   MONOABS 0.5 09/29/2017 1109   EOSABS 0.2 09/29/2017 1109   BASOSABS 0.0 09/29/2017 1109    No results found for: POCLITH, LITHIUM   No results found for: PHENYTOIN, PHENOBARB, VALPROATE, CBMZ   .res Assessment: Plan:   Patient reports that she would like to continue current medications without any changes at this time since she reports that current mood and anxiety signs and symptoms may be primarily in response to the pandemic.  She also reports some concern about possible adverse effects with medication changes and would prefer not to introduce a new medication at this time.  Discussed that increasing amitriptyline may be a possible treatment option in the future since she is currently tolerating low-dose amitriptyline and it has been helpful for her insomnia and therefore higher doses may also be beneficial for her mood and anxiety.  Patient reports that she does not need any medication refills at this time since  medical provider has been prescribing Elavil and Xanax as needed.  Recommend continuing psychotherapy with Rinaldo Cloud, LCSW.  Patient to follow-up with this provider in 3 months or sooner if clinically indicated.  Patient advised to contact  office with any questions, adverse effects, or acute worsening in signs and symptoms.  Sarah Cobb was seen today for anxiety and depression.  Diagnoses and all orders for this visit:  GAD (generalized anxiety disorder)  Dysthymic disorder    Please see After Visit Summary for patient specific instructions.  Future Appointments  Date Time Provider [place]  07/03/2018  8:00 AM Shanon Ace, LCSW [facility] None    No orders of the defined types were placed in this encounter.     -------------------------------

## 2018-07-03 ENCOUNTER — Other Ambulatory Visit: Payer: Self-pay

## 2018-07-03 ENCOUNTER — Ambulatory Visit (INDEPENDENT_AMBULATORY_CARE_PROVIDER_SITE_OTHER): Payer: BC Managed Care – PPO | Admitting: Psychiatry

## 2018-07-03 DIAGNOSIS — F411 Generalized anxiety disorder: Secondary | ICD-10-CM

## 2018-07-03 NOTE — Progress Notes (Signed)
[facility] Counselor/Therapist Progress Note  Patient ID: Sarah Cobb, MRN: [id number],    Date: 07/03/2018  Time Spent: 60 minutes      8:00am to 9:00am  Treatment Type: Individual Therapy   Virtual Visit Note Connected with patient by a video enabled telemedicine/telehealth application or telephone, with their informed consent, and verified patient privacy and that I am speaking with the correct person using two identifiers. I discussed the limitations, risks, security and privacy concerns of performing psychotherapy and management service by telephone and the availability of in person appointments. I also discussed with the patient that there may be a patient responsible charge related to this service. The patient expressed understanding and agreed to proceed. I discussed the treatment planning with the patient. The patient was provided an opportunity to ask questions and all were answered. The patient agreed with the plan and demonstrated an understanding of the instructions. The patient was advised to call  our office if  symptoms worsen or feel they are in a crisis state and need immediate contact.   Therapist Location: [facility] Patient Location: private office   Reported Symptoms: anxiety, some unresolved grief re: adoption issues  Mental Status Exam:  Appearance:   N/A   (teletherapy)     Behavior:  Sharing  Motor:  N/A     (teletherapy)  Speech/Language:   Clear and Coherent  Affect:  N/A     (teletherapy)  Mood:  anxious  Thought process:  goal directed  Thought content:    WNL  Sensory/Perceptual disturbances:    WNL  Orientation:  oriented to person, place, time/date, situation, day of week, month of year and year  Attention:  Good  Concentration:  Good  Memory:  WNL  Fund of knowledge:   Good  Insight:    Good  Judgment:   Good  Impulse Control:  Good   Risk Assessment: Danger to Self:  No Self-injurious Behavior: No Danger to  Others: No Duty to Warn:no Physical Aggression / Violence:No  Access to Firearms a concern: No  Gang Involvement:No   Subjective:   Patient reporting symptoms listed above today.  "Had a bad night with their 25 yrs old fragile dog that she is very close to.  Dog is definitely declining and she is concerned about him. Discussed her concerns and feelings for her dog who is clearly "a member of the family."  Also expressing continued fears and concerns about the pandemic and current racial tensions across the country, although managing some better. Feels that these events are affecting her mood and feeling tired, depressed, anxious, frustrated.  Shared her anxieties and frustrations, as well as her concerns in detail as well as looked at some coping strategies to help with anxiety, depressed mood, and frustration.  Worked hard also today on her feelings of "I dont think I'll ever get over some of the feeling different from others and some of the questions.  People who would know some of the answers to questions, are no longer living. I'd like to know why my birth mom gave me up."  Picked up on conversation from last session and processed in more detail.  Shared more about her thoughts again that being adopted may have affected my self-confidence, ability to trust others, self-esteem. Felt I needed to stay hidden because I was different.  Often feels introspective and sad due to this and ends up self-negating. Acknowledges she needs to "let go" in order to move forward.  Patient assumes mom didn't care enough and money may have been an issue also.  Hasn't been able to talk to anyone who knows. Processed her conflicts about not being able to get some answers at this point, and is working towards being able to "let go".  Discussed what "letting go" would mean for her and how she can work towards that as she wants to move forward in her life without negative thoughts from the past.  Negative self-talk is a  significant block right now so we talked through strategies to lessen that behavior.  Patient receptive reports feeling some better after venting today and discussing strategies she can use.  Plans to work further on this.  Goals already begun and progress noted per Flowsheets section of chart.  Interventions: Cognitive Behavioral Therapy and Solution-Oriented/Positive Psychology  Diagnosis:   ICD-10-CM   1. GAD (generalized anxiety disorder)  F41.1     Plan: Patient to follow through on good overall self-care discussed today.   Did not complete that today as patient needed the talk time to process her current feelings more.  To stay on meds as prescribed by med provider.  Shanon Ace, LCSW

## 2018-07-20 ENCOUNTER — Ambulatory Visit (INDEPENDENT_AMBULATORY_CARE_PROVIDER_SITE_OTHER): Payer: BC Managed Care – PPO | Admitting: Psychiatry

## 2018-07-20 ENCOUNTER — Other Ambulatory Visit: Payer: Self-pay

## 2018-07-20 DIAGNOSIS — F411 Generalized anxiety disorder: Secondary | ICD-10-CM

## 2018-07-20 NOTE — Progress Notes (Signed)
[facility] Counselor/Therapist Progress Note  Patient ID: Sarah Cobb, MRN: [id number],    Date: 07/20/2018  Time Spent: 60 minutes      1:00pm to 2:00pm  Treatment Type: Individual Therapy   Virtual Visit Note Connected with patient by a video enabled telemedicine/telehealth application or telephone, with their informed consent, and verified patient privacy and that I am speaking with the correct person using two identifiers. I discussed the limitations, risks, security and privacy concerns of performing psychotherapy and management service by telephone and the availability of in person appointments. I also discussed with the patient that there may be a patient responsible charge related to this service. The patient expressed understanding and agreed to proceed. I discussed the treatment planning with the patient. The patient was provided an opportunity to ask questions and all were answered. The patient agreed with the plan and demonstrated an understanding of the instructions. The patient was advised to call  our office if  symptoms worsen or feel they are in a crisis state and need immediate contact.   Therapist Location: [facility] Patient Location: private office   Reported Symptoms: anxiety, some unresolved grief re: adoption issues  Mental Status Exam:  Appearance:   N/A   (teletherapy)     Behavior:  Sharing  Motor:  N/A     (teletherapy)  Speech/Language:   Clear and Coherent  Affect:  N/A     (teletherapy)  Mood:  anxious  Thought process:  goal directed  Thought content:    WNL  Sensory/Perceptual disturbances:    WNL  Orientation:  oriented to person, place, time/date, situation, day of week, month of year and year  Attention:  Good  Concentration:  Good  Memory:  WNL  Fund of knowledge:   Good  Insight:    Good  Judgment:   Good  Impulse Control:  Good   Risk Assessment: Danger to Self:  No Self-injurious Behavior: No Danger to  Others: No Duty to Warn:no Physical Aggression / Violence:No  Access to Firearms a concern: No  Gang Involvement:No   Subjective:   Patient sad today re: 17 yr old pet dog is getting worse and vet told them he may live 2-6 months and in meantime they give him hydration treatments, subdural applications. Very sad for patient as her dog is understandably " a member of her family".  Worked again today on her feelings of "I dont think I'll ever get over some of the feeling different from others and some of the questions she still has in regards to birth parents. Picked up from last session and processed in more detail the things that are still impacting her re: her being "given up for adoption." Is active on Adoption Pages on facebook and that is helpful sometimes and not so helpful at other times.  Does have a sense of eventually wanting to reach some "peace" and be able to work to "let go" in order to move forward. "But I'm not there yet."  Shared more about her thoughts again that being adopted may have affected my self-confidence, ability to trust others, self-esteem. Felt I needed to stay hidden because I was different.  Often feels introspective and sad due to this and ends up self-negating.  Patient assumes mom didn't care enough and money may have been an issue also.. Processed her conflicts about not being able to get some answers at this point, and is working towards being able to "let go".  Discussed again what "letting go" would mean for her and how she can work towards that as she wants to move forward in her life without negative thoughts from the past. Also discussed how this can happen gradually for her and that seemed to be better accepted by patient.  Negative self-talk has worked against her and per homework, she is doing some better in lessening her negative self-talk.  To work further on strategies we previously discussed to help her in her anxiety, re-focusing as needed, and developing  more positive self-talk. Goals already begun and progress noted per Flowsheets section of chart. Good motivation and insight.  Interventions: Cognitive Behavioral Therapy and Solution-Oriented/Positive Psychology  Diagnosis:   ICD-10-CM   1. GAD (generalized anxiety disorder)  F41.1     Plan: Patient to follow through on good overall self-care discussed today.   To follow up on strategies reviewed and next appt to be within 2 wks.  To stay on meds as prescribed by med provider.  Shanon Ace, LCSW

## 2018-08-04 ENCOUNTER — Ambulatory Visit: Payer: BC Managed Care – PPO | Admitting: Psychiatry

## 2018-09-25 ENCOUNTER — Ambulatory Visit: Payer: BC Managed Care – PPO | Admitting: Psychiatry

## 2018-10-06 ENCOUNTER — Other Ambulatory Visit: Payer: Self-pay

## 2018-10-06 ENCOUNTER — Ambulatory Visit (INDEPENDENT_AMBULATORY_CARE_PROVIDER_SITE_OTHER): Payer: BC Managed Care – PPO | Admitting: Psychiatry

## 2018-10-06 ENCOUNTER — Encounter: Payer: Self-pay | Admitting: Psychiatry

## 2018-10-06 DIAGNOSIS — F411 Generalized anxiety disorder: Secondary | ICD-10-CM | POA: Diagnosis not present

## 2018-10-06 DIAGNOSIS — F341 Dysthymic disorder: Secondary | ICD-10-CM | POA: Diagnosis not present

## 2018-10-06 NOTE — Progress Notes (Signed)
Sarah Cobb [id number] [birth year] 48 y.o.  Virtual Visit via Telephone Note  I connected with pt on 10/06/18 at  2:30 PM EDT by telephone and verified that I am speaking with the correct person using two identifiers.   I discussed the limitations, risks, security and privacy concerns of performing an evaluation and management service by telephone and the availability of in person appointments. I also discussed with the patient that there may be a patient responsible charge related to this service. The patient expressed understanding and agreed to proceed.   I discussed the assessment and treatment plan with the patient. The patient was provided an opportunity to ask questions and all were answered. The patient agreed with the plan and demonstrated an understanding of the instructions.   The patient was advised to call back or seek an in-person evaluation if the symptoms worsen or if the condition fails to improve as anticipated.  I provided 15 minutes of non-face-to-face time during this encounter.  The patient was located at home.  The provider was located at [place].   Thayer Headings, PMHNP   Subjective:   Patient ID:  Sarah Cobb is a 48 y.o. (DOB 09-17-1970) female.  Chief Complaint:  Chief Complaint  Patient presents with  . Follow-up    Anxiety    HPI Sarah Cobb presents for follow-up of anxiety. She reports that she continues to have significant worry. May have had one possible panic attack 2-3 weeks ago. She reports depressed mood and reports that mood is persistently sad. Occ irritability. She reports that her sleep has been interrupted due to dog barking at night. Appetite has been "fine." Describes energy and motivation as "normal." Denies concentration difficulties. Denies SI.   Taking Xanax on a prn basis only. Reports taking Xanax sometimes only every few weeks and is unable to take it during the day due to drowsiness.    She reports that they have been short-staffed at work and she has been busy and working longer hours. No longer working remotely and only did remote work for about a week.   Past Psychiatric Medication Trials: Reports that she has some med sensitivity.  Diazepam- Palpitations Xanax- uses only prn when she is unable to sleep. Reports that she has not taken Xanax during the day Amitriptyline- Taken 6-8 months and thinks it helps for insomnia. Unsure if it has helped for pain and has not noticed improvement in mood.  Prozac- Had adverse effects and took briefly Paxil- Adverse effects Effexor XR- Took for several years and had "brain zaps." Had severe discontinuation with d/c and missed doses. Does not recall response.  Pristiq- Worked well for depression for 6 weeks and then developed severe itching and had to stop.  Cymbalta- had severe hot flashes.  Trazodone- Prescribed but did not take.  Review of Systems:  Review of Systems  Constitutional: Negative for fever.  Musculoskeletal: Positive for arthralgias and myalgias. Negative for gait problem.  Neurological: Negative for tremors.  Hematological:       Had recent elevated WBC count and was referred back to PCP.   Psychiatric/Behavioral:       Please refer to HPI    Medications: I have reviewed the patient's current medications.  Current Outpatient Medications  Medication Sig Dispense Refill  . ALPRAZolam (XANAX) 0.25 MG tablet Take 0.25 mg by mouth 3 (three) times daily as needed for anxiety.     Marland Kitchen amitriptyline (ELAVIL) 10 MG tablet Take 10 mg by mouth  at bedtime.    . cyclobenzaprine (FLEXERIL) 10 MG tablet Take 1 tablet (10 mg total) by mouth 3 (three) times daily as needed for muscle spasms. 30 tablet 2  . dexlansoprazole (DEXILANT) 60 MG capsule Take 60 mg by mouth every morning.    . LO LOESTRIN FE 1 MG-10 MCG / 10 MCG tablet Take 1 tablet by mouth every morning.     . Multiple Vitamin (MULTIVITAMIN) tablet Take 1 tablet  by mouth daily.    Marland Kitchen OVER THE COUNTER MEDICATION Take 1 capsule by mouth daily.    Marland Kitchen OVER THE COUNTER MEDICATION at bedtime.    . Probiotic Product (PROBIOTIC PO) Take by mouth.    . traMADol (ULTRAM) 50 MG tablet Take 1 tablet (50 mg total) by mouth every 6 (six) hours as needed for severe pain. 15 tablet 0   No current facility-administered medications for this visit.     Medication Side Effects: None  Allergies:  Allergies  Allergen Reactions  . Diazepam Palpitations    Tachycardia  (intolerance)  . Desvenlafaxine     Past Medical History:  Diagnosis Date  . Anxiety   . Arrhythmia    Mitral valve regurgitation per pt- OV Dr Chancy Milroy, cardio 6/13- states had eccho and EKG  . Depression   . Family history of adverse reaction to anesthesia    don't know, she was adopted  . Fibromyalgia   . GERD (gastroesophageal reflux disease)   . History of hiatal hernia   . History of kidney stones   . Mitral valve regurgitation   . Pneumonia   . Rosacea     Family History  Adopted: Yes  Family history unknown: Yes    Social History   Socioeconomic History  . Marital status: Married    Spouse name: Not on file  . Number of children: 0  . Years of education: Not on file  . Highest education level: Not on file  Occupational History  . Occupation: Financial trader with [employer]  Social Needs  . Financial resource strain: Not on file  . Food insecurity    Worry: Not on file    Inability: Not on file  . Transportation needs    Medical: Not on file    Non-medical: Not on file  Tobacco Use  . Smoking status: Former Smoker    Years: 15.00    Types: Cigarettes    Quit date: 07/21/1998    Years since quitting: 20.2  . Smokeless tobacco: Never Used  Substance and Sexual Activity  . Alcohol use: Yes    Alcohol/week: 0.0 standard drinks    Comment: socially  . Drug use: No  . Sexual activity: Not Currently  Lifestyle  . Physical activity    Days per week: Not on file     Minutes per session: Not on file  . Stress: Not on file  Relationships  . Social Herbalist on phone: Not on file    Gets together: Not on file    Attends religious service: Not on file    Active member of club or organization: Not on file    Attends meetings of clubs or organizations: Not on file    Relationship status: Not on file  . Intimate partner violence    Fear of current or ex partner: Not on file    Emotionally abused: Not on file    Physically abused: Not on file    Forced sexual activity: Not on  file  Other Topics Concern  . Not on file  Social History Narrative  . Not on file    Past Medical History, Surgical history, Social history, and Family history were reviewed and updated as appropriate.   Please see review of systems for further details on the patient's review from today.   Objective:   Physical Exam:  There were no vitals taken for this visit.  Physical Exam Neurological:     Mental Status: She is alert and oriented to person, place, and time.     Cranial Nerves: No dysarthria.  Psychiatric:        Attention and Perception: Attention normal.        Mood and Affect: Mood is anxious and depressed.        Speech: Speech normal.        Behavior: Behavior is cooperative.        Thought Content: Thought content normal. Thought content is not paranoid or delusional. Thought content does not include homicidal or suicidal ideation. Thought content does not include homicidal or suicidal plan.        Cognition and Memory: Cognition and memory normal.        Judgment: Judgment normal.     Lab Review:     Component Value Date/Time   NA 138 01/10/2018 2049   K 3.7 01/10/2018 2049   CL 105 01/10/2018 2049   CO2 23 01/10/2018 2049   GLUCOSE 134 (H) 01/10/2018 2049   BUN 11 01/10/2018 2049   CREATININE 0.90 01/10/2018 2049   CALCIUM 9.1 01/10/2018 2049   PROT 8.1 09/29/2017 1109   ALBUMIN 4.2 09/29/2017 1109   AST 31 09/29/2017 1109   ALT 38  09/29/2017 1109   ALKPHOS 66 09/29/2017 1109   BILITOT 0.4 09/29/2017 1109   GFRNONAA >60 01/10/2018 2049   GFRAA >60 01/10/2018 2049       Component Value Date/Time   WBC 14.5 (H) 01/10/2018 2049   RBC 4.22 01/10/2018 2049   HGB 12.6 01/10/2018 2049   HCT 38.2 01/10/2018 2049   PLT 326 01/10/2018 2049   MCV 90.5 01/10/2018 2049   MCV 93.9 11/25/2013 1113   MCH 29.9 01/10/2018 2049   MCHC 33.0 01/10/2018 2049   RDW 12.7 01/10/2018 2049   LYMPHSABS 3.1 09/29/2017 1109   MONOABS 0.5 09/29/2017 1109   EOSABS 0.2 09/29/2017 1109   BASOSABS 0.0 09/29/2017 1109    No results found for: POCLITH, LITHIUM   No results found for: PHENYTOIN, PHENOBARB, VALPROATE, CBMZ   .res Assessment: Plan:   Pt reports that she prefers not to make any changes at this time. She reports that she will contact office with any worsening s/s. Pt to f/u in 6 months or sooner if clinically indicated.  Patient advised to contact office with any questions, adverse effects, or acute worsening in signs and symptoms.  Rayven was seen today for follow-up.  Diagnoses and all orders for this visit:  Dysthymic disorder  GAD (generalized anxiety disorder)    Please see After Visit Summary for patient specific instructions.  No future appointments.  No orders of the defined types were placed in this encounter.     -------------------------------

## 2019-05-31 ENCOUNTER — Other Ambulatory Visit: Payer: Self-pay | Admitting: Obstetrics and Gynecology

## 2019-05-31 DIAGNOSIS — N63 Unspecified lump in unspecified breast: Secondary | ICD-10-CM

## 2019-06-13 ENCOUNTER — Ambulatory Visit: Admission: RE | Admit: 2019-06-13 | Payer: Self-pay | Source: Ambulatory Visit

## 2019-06-13 ENCOUNTER — Other Ambulatory Visit: Payer: Self-pay

## 2019-06-13 ENCOUNTER — Ambulatory Visit
Admission: RE | Admit: 2019-06-13 | Discharge: 2019-06-13 | Disposition: A | Discharge status: Home / Self Care | Payer: BC Managed Care – PPO | Source: Ambulatory Visit | Attending: Obstetrics and Gynecology | Admitting: Obstetrics and Gynecology

## 2019-06-13 DIAGNOSIS — N63 Unspecified lump in unspecified breast: Secondary | ICD-10-CM

## 2019-07-17 ENCOUNTER — Encounter: Payer: Self-pay | Admitting: Physical Medicine & Rehabilitation

## 2019-08-03 ENCOUNTER — Other Ambulatory Visit: Payer: Self-pay

## 2019-08-03 ENCOUNTER — Encounter: Payer: BC Managed Care – PPO | Admitting: Physical Medicine & Rehabilitation

## 2019-08-03 VITALS — Ht 59.5 in | Wt 166.2 lb

## 2019-08-16 ENCOUNTER — Encounter
Payer: BC Managed Care – PPO | Attending: Physical Medicine & Rehabilitation | Admitting: Physical Medicine & Rehabilitation

## 2019-08-16 ENCOUNTER — Encounter: Payer: Self-pay | Admitting: Physical Medicine & Rehabilitation

## 2019-08-16 ENCOUNTER — Other Ambulatory Visit: Payer: Self-pay

## 2019-08-16 VITALS — BP 129/85 | HR 96 | Temp 97.9°F | Ht 60.0 in | Wt 169.6 lb

## 2019-08-16 DIAGNOSIS — M797 Fibromyalgia: Secondary | ICD-10-CM | POA: Diagnosis not present

## 2019-08-16 DIAGNOSIS — M545 Low back pain, unspecified: Secondary | ICD-10-CM

## 2019-08-16 DIAGNOSIS — G8929 Other chronic pain: Secondary | ICD-10-CM | POA: Diagnosis not present

## 2019-08-16 MED ORDER — DULOXETINE HCL 20 MG PO CPEP
20.0000 mg | ORAL_CAPSULE | Freq: Every day | ORAL | 1 refills | Status: DC
Start: 2019-08-16 — End: 2019-09-11

## 2019-08-16 NOTE — Patient Instructions (Signed)
Duloxetine may cause nausea for a week or so, may need to escalate dose

## 2019-08-16 NOTE — Progress Notes (Signed)
Subjective:    Patient ID: Sarah Cobb, female    DOB: [birth year], 49 y.o.   MRN: [id number]  HPI 49 year old female who was referred by her orthopedic surgeon Dr. Erasmo Score for the primary complaint of: "pain all over"  Worse pain is in back, mainly in the lumbar area.  She does not note any accident or injury which provoke this pain.  She does not have pain that radiates into her lower extremity. Heating pad helps Used to get massages which were helpful.  The patient states that sometimes her husband massages her arms and legs but she is hypersensitive at times to touch. The patient has not had any physical therapy recently Mod I dressing and bathing  Works in [employer], [place] alternates between sitting at desk and walking to and from [employer] at work, used to get [employer] steps per day Currently down to [employer] steps per day, Gabapentin  Past medical history is significant for fibromyalgia syndrome.  She has been on gabapentin in the past but this caused excess fatigue.  She also was on pregabalin in the past but this caused fluid retention/weight gain.  She has not been on duloxetine in the past.  She is familiar with the medication because her husband takes it.  No falls or injuries  Pain Inventory Average Pain 8 Pain Right Now 7 My pain is sharp, dull, stabbing and aching  In the last 24 hours, has pain interfered with the following? General activity 4 Relation with others 5 Enjoyment of life 5 What TIME of day is your pain at its worst? varies Sleep (in general) Fair  Pain is worse with: bending, standing and some activites Pain improves with: rest and heat/ice Relief from Meds: 5  Mobility walk without assistance how many minutes can you walk? 30 ability to climb steps?  yes do you drive?  yes  Function employed # of hrs/week 40 what is your job? [employer] services I need assistance with the following:  household  duties  Neuro/Psych numbness depression anxiety  Prior Studies x-rays CT/MRI  Physicians involved in your care [facility]   Family History  Adopted: Yes  Family history unknown: Yes   Social History   Socioeconomic History  . Marital status: Married    Spouse name: Not on file  . Number of children: 0  . Years of education: Not on file  . Highest education level: Not on file  Occupational History  . Occupation: Financial trader with [employer]  Tobacco Use  . Smoking status: Former Smoker    Years: 15.00    Types: Cigarettes    Quit date: 07/21/1998    Years since quitting: 21.0  . Smokeless tobacco: Never Used  Vaping Use  . Vaping Use: Never used  Substance and Sexual Activity  . Alcohol use: Yes    Alcohol/week: 0.0 standard drinks    Comment: socially  . Drug use: No  . Sexual activity: Not Currently  Other Topics Concern  . Not on file  Social History Narrative  . Not on file   Social Determinants of Health   Financial Resource Strain:   . Difficulty of Paying Living Expenses:   Food Insecurity:   . Worried About Charity fundraiser in the Last Year:   . Arboriculturist in the Last Year:   Transportation Needs:   . Film/video editor (Medical):   Marland Kitchen Lack of Transportation (Non-Medical):  Physical Activity:   . Days of Exercise per Week:   . Minutes of Exercise per Session:   Stress:   . Feeling of Stress :   Social Connections:   . Frequency of Communication with Friends and Family:   . Frequency of Social Gatherings with Friends and Family:   . Attends Religious Services:   . Active Member of Clubs or Organizations:   . Attends Archivist Meetings:   Marland Kitchen Marital Status:    Past Surgical History:  Procedure Laterality Date  . CHOLECYSTECTOMY N/A 01/02/2015   Procedure: LAPAROSCOPIC CHOLECYSTECTOMY WITH INTRAOPERATIVE CHOLANGIOGRAM;  Surgeon: Stark Klein, MD;  Location: [facility];   Service: General;  Laterality: N/A;  . COLONOSCOPY  03/15/2011   NORMAL  . CRYOTHERAPY     CERVIX  . FRACTURE SURGERY     2013  . JOINT REPLACEMENT Right    partial   . KNEE ARTHROSCOPY  02/03/2012   Procedure: ARTHROSCOPY KNEE;  Surgeon: Johnn Hai, MD;  Location: [facility];  Service: Orthopedics;  Laterality: Right;  Right Knee Arthroscopy with Manipulation Under Anesthesia/Evaluation Under Anesthesia/Debridement  . LITHOTRIPSY    . ORIF PATELLA  07/22/2011   Procedure: OPEN REDUCTION INTERNAL (ORIF) FIXATION PATELLA;  Surgeon: Johnn Hai, MD;  Location: [facility];  Service: Orthopedics;  Laterality: Right;  . PATELLA-FEMORAL ARTHROPLASTY Right 01/02/2016   Procedure: RIGHT KNEE PATELLA-FEMORAL ARTHROPLASTY;  Surgeon: Susa Day, MD;  Location: [facility];  Service: Orthopedics;  Laterality: Right;  Requests 2.5 hrs  . UPPER GI ENDOSCOPY  2016  . WISDOM TOOTH EXTRACTION     Past Medical History:  Diagnosis Date  . Anxiety   . Arrhythmia    Mitral valve regurgitation per pt- OV Dr Chancy Milroy, cardio 6/13- states had eccho and EKG  . Depression   . Family history of adverse reaction to anesthesia    don't know, she was adopted  . Fibromyalgia   . GERD (gastroesophageal reflux disease)   . History of hiatal hernia   . History of kidney stones   . Mitral valve regurgitation   . Pneumonia   . Rosacea    BP 129/85   Pulse 96   Temp 97.9 F (36.6 C)   Ht 5' (1.524 m)   Wt 169 lb 9.6 oz (76.9 kg)   SpO2 97%   BMI 33.12 kg/m   Opioid Risk Score:   Fall Risk Score:  `1  Depression screen PHQ 2/9  Depression screen PHQ 2/9 08/16/2019  Decreased Interest 1  Down, Depressed, Hopeless 1  PHQ - 2 Score 2  Altered sleeping 2  Tired, decreased energy 3  Change in appetite 0  Feeling bad or failure about yourself  0  Trouble concentrating 2  Moving slowly or fidgety/restless 0  Suicidal thoughts 0  PHQ-9 Score 9  Difficult doing work/chores Somewhat difficult    Review of  Systems  Psychiatric/Behavioral: Positive for dysphoric mood. The patient is nervous/anxious.   All other systems reviewed and are negative.      Objective:   Physical Exam Vitals and nursing note reviewed.  Constitutional:      Appearance: She is obese.  HENT:     Head: Normocephalic and atraumatic.     Nose: Nose normal.  Eyes:     General: No visual field deficit or scleral icterus.       Right eye: No discharge.        Left eye: No discharge.     Extraocular  Movements: Extraocular movements intact.     Conjunctiva/sclera: Conjunctivae normal.     Pupils: Pupils are equal, round, and reactive to light.  Cardiovascular:     Rate and Rhythm: Normal rate and regular rhythm.     Heart sounds: Normal heart sounds. No murmur heard.   Pulmonary:     Effort: Pulmonary effort is normal. No respiratory distress.     Breath sounds: Normal breath sounds. No stridor. No rhonchi.  Abdominal:     General: Abdomen is flat. Bowel sounds are normal. There is no distension.     Palpations: Abdomen is soft.     Tenderness: There is no guarding.  Musculoskeletal:     Cervical back: Normal range of motion. No rigidity or tenderness.     Thoracic back: No deformity or tenderness. Decreased range of motion. No scoliosis.     Lumbar back: Tenderness present. No deformity. Decreased range of motion.     Comments: Lumbar range of motion 50% with flexion extension lateral bending and rotation.  Extension provokes the most pain in the lumbar area Normal hip knee ankle range of motion No swelling or effusions noted in the hands wrists elbows knees or feet  Neurological:     Mental Status: She is alert and oriented to person, place, and time.     Cranial Nerves: Cranial nerves are intact. No dysarthria or facial asymmetry.     Sensory: Sensation is intact.     Motor: Motor function is intact.     Coordination: Coordination is intact.     Gait: Gait is intact.     Comments: Motor strength is 5/5  bilateral deltoid, bicep, tricep, grip, hip flexors, knee extensors, ankle dorsiflexor plantar flexor Negative straight leg raise Sensation intact light touch bilateral C5-6-7 8 L2-L3-L4 L5-S1 dermatome distribution   Psychiatric:        Mood and Affect: Mood normal.        Behavior: Behavior normal.     Mood and affect appear appropriate PHQ-9 score reviewed FM tender point x 5 - B upper trap, Bilateral lumbar , bilateral hips , Right elbow      Assessment & Plan:  #1.  Chronic widespread pain although slightly worse in the lumbar area He does have several fibromyalgia tender points also moderately elevated PHQ-9 score.  Has trialed gabapentin and pregabalin but could not tolerate side effects. Recommend duloxetine starting at 20 mg/day may need to titrate upward with maximum dose of 60 mg/day if needed. Patient will follow up with Dr. Ranell Patrick in 1 month  Patient may benefit from lumbar medial branch blocks although first would recommend titrating upward on duloxetine to maximum effect

## 2019-09-09 ENCOUNTER — Other Ambulatory Visit: Payer: Self-pay | Admitting: Physical Medicine & Rehabilitation

## 2019-09-13 ENCOUNTER — Encounter
Payer: BC Managed Care – PPO | Attending: Physical Medicine and Rehabilitation | Admitting: Physical Medicine and Rehabilitation

## 2019-09-13 ENCOUNTER — Other Ambulatory Visit: Payer: Self-pay

## 2019-09-13 ENCOUNTER — Encounter: Payer: Self-pay | Admitting: Physical Medicine and Rehabilitation

## 2019-09-13 VITALS — BP 133/85 | HR 92 | Temp 98.2°F | Ht 61.0 in | Wt 168.2 lb

## 2019-09-13 DIAGNOSIS — Z6831 Body mass index (BMI) 31.0-31.9, adult: Secondary | ICD-10-CM | POA: Diagnosis not present

## 2019-09-13 DIAGNOSIS — E669 Obesity, unspecified: Secondary | ICD-10-CM | POA: Diagnosis not present

## 2019-09-13 DIAGNOSIS — M545 Low back pain: Secondary | ICD-10-CM | POA: Diagnosis present

## 2019-09-13 DIAGNOSIS — M797 Fibromyalgia: Secondary | ICD-10-CM | POA: Diagnosis not present

## 2019-09-13 DIAGNOSIS — M7918 Myalgia, other site: Secondary | ICD-10-CM | POA: Diagnosis not present

## 2019-09-13 DIAGNOSIS — G8929 Other chronic pain: Secondary | ICD-10-CM | POA: Insufficient documentation

## 2019-09-13 NOTE — Progress Notes (Signed)
Subjective:    Patient ID: Sarah Cobb, female    DOB: 06-06-1970, 49 y.o.   MRN: [id number]  HPI  [name] is a 49 year old woman who presents to establish care for fibromyalgia.  In 2013 she shattered her knee. She underwent surgery and physical therapy for 2 years. A year later she had a partial knee replacement. This pain is much better but still there in her right knee.  She does not walk long distances but walks 15 minutes at a time. 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 She goes to work early and walks the hallways.   She tried Lyrica for her fibromyalgia which helped but caused her to weight gain so she stopped. She gained about 20 lbs. She saw prescribed Cymbalta but has not read great things about this.   Pain Inventory Average Pain 7 Pain Right Now 5 My pain is sharp, dull, tingling and aching  In the last 24 hours, has pain interfered with the following? General activity 7 Relation with others 6 Enjoyment of life 6 What TIME of day is your pain at its worst? varies Sleep (in general) Fair  Pain is worse with: sitting and standing Pain improves with: rest and heat/ice Relief from Meds: 6  Family History  Adopted: Yes  Family history unknown: Yes   Social History   Socioeconomic History  . Marital status: Married    Spouse name: Not on file  . Number of children: 0  . Years of education: Not on file  . Highest education level: Not on file  Occupational History  . Occupation: Financial trader with [employer]  Tobacco Use  . Smoking status: Former Smoker    Years: 15.00    Types: Cigarettes    Quit date: 07/21/1998    Years since quitting: 21.1  . Smokeless tobacco: Never Used  Vaping Use  . Vaping Use: Never used  Substance and Sexual Activity  . Alcohol use: Yes    Alcohol/week: 0.0 standard drinks    Comment: socially  . Drug use: No  . Sexual activity: Not Currently  Other Topics Concern  . Not on file  Social  History Narrative  . Not on file   Social Determinants of Health   Financial Resource Strain:   . Difficulty of Paying Living Expenses: Not on file  Food Insecurity:   . Worried About Charity fundraiser in the Last Year: Not on file  . Ran Out of Food in the Last Year: Not on file  Transportation Needs:   . Lack of Transportation (Medical): Not on file  . Lack of Transportation (Non-Medical): Not on file  Physical Activity:   . Days of Exercise per Week: Not on file  . Minutes of Exercise per Session: Not on file  Stress:   . Feeling of Stress : Not on file  Social Connections:   . Frequency of Communication with Friends and Family: Not on file  . Frequency of Social Gatherings with Friends and Family: Not on file  . Attends Religious Services: Not on file  . Active Member of Clubs or Organizations: Not on file  . Attends Archivist Meetings: Not on file  . Marital Status: Not on file   Past Surgical History:  Procedure Laterality Date  . CHOLECYSTECTOMY N/A 01/02/2015   Procedure: LAPAROSCOPIC CHOLECYSTECTOMY WITH INTRAOPERATIVE CHOLANGIOGRAM;  Surgeon: Stark Klein, MD;  Location: [place];  Service: General;  Laterality: N/A;  . COLONOSCOPY  03/15/2011   NORMAL  . CRYOTHERAPY     CERVIX  . FRACTURE SURGERY     2013  . JOINT REPLACEMENT Right    partial   . KNEE ARTHROSCOPY  02/03/2012   Procedure: ARTHROSCOPY KNEE;  Surgeon: Johnn Hai, MD;  Location: [facility];  Service: Orthopedics;  Laterality: Right;  Right Knee Arthroscopy with Manipulation Under Anesthesia/Evaluation Under Anesthesia/Debridement  . LITHOTRIPSY    . ORIF PATELLA  07/22/2011   Procedure: OPEN REDUCTION INTERNAL (ORIF) FIXATION PATELLA;  Surgeon: Johnn Hai, MD;  Location: [facility];  Service: Orthopedics;  Laterality: Right;  . PATELLA-FEMORAL ARTHROPLASTY Right 01/02/2016   Procedure: RIGHT KNEE PATELLA-FEMORAL ARTHROPLASTY;  Surgeon: Susa Day, MD;  Location: [facility];  Service:  Orthopedics;  Laterality: Right;  Requests 2.5 hrs  . UPPER GI ENDOSCOPY  2016  . WISDOM TOOTH EXTRACTION     Past Surgical History:  Procedure Laterality Date  . CHOLECYSTECTOMY N/A 01/02/2015   Procedure: LAPAROSCOPIC CHOLECYSTECTOMY WITH INTRAOPERATIVE CHOLANGIOGRAM;  Surgeon: Stark Klein, MD;  Location: [facility];  Service: General;  Laterality: N/A;  . COLONOSCOPY  03/15/2011   NORMAL  . CRYOTHERAPY     CERVIX  . FRACTURE SURGERY     2013  . JOINT REPLACEMENT Right    partial   . KNEE ARTHROSCOPY  02/03/2012   Procedure: ARTHROSCOPY KNEE;  Surgeon: Johnn Hai, MD;  Location: [facility];  Service: Orthopedics;  Laterality: Right;  Right Knee Arthroscopy with Manipulation Under Anesthesia/Evaluation Under Anesthesia/Debridement  . LITHOTRIPSY    . ORIF PATELLA  07/22/2011   Procedure: OPEN REDUCTION INTERNAL (ORIF) FIXATION PATELLA;  Surgeon: Johnn Hai, MD;  Location: [facility];  Service: Orthopedics;  Laterality: Right;  . PATELLA-FEMORAL ARTHROPLASTY Right 01/02/2016   Procedure: RIGHT KNEE PATELLA-FEMORAL ARTHROPLASTY;  Surgeon: Susa Day, MD;  Location: [facility];  Service: Orthopedics;  Laterality: Right;  Requests 2.5 hrs  . UPPER GI ENDOSCOPY  2016  . WISDOM TOOTH EXTRACTION     Past Medical History:  Diagnosis Date  . Anxiety   . Arrhythmia    Mitral valve regurgitation per pt- OV Dr Chancy Milroy, cardio 6/13- states had eccho and EKG  . Depression   . Family history of adverse reaction to anesthesia    don't know, she was adopted  . Fibromyalgia   . GERD (gastroesophageal reflux disease)   . History of hiatal hernia   . History of kidney stones   . Mitral valve regurgitation   . Pneumonia   . Rosacea    BP 133/85   Pulse 92   Temp 98.2 F (36.8 C)   Ht 5' 1"  (1.549 m)   Wt 168 lb 3.2 oz (76.3 kg)   SpO2 96%   BMI 31.78 kg/m   Opioid Risk Score:   Fall Risk Score:  `1  Depression screen PHQ 2/9  Depression screen [facility] 2/9 09/13/2019 08/16/2019  Decreased  Interest 3 1  Down, Depressed, Hopeless 3 1  PHQ - 2 Score 6 2  Altered sleeping - 2  Tired, decreased energy - 3  Change in appetite - 0  Feeling bad or failure about yourself  - 0  Trouble concentrating - 2  Moving slowly or fidgety/restless - 0  Suicidal thoughts - 0  PHQ-9 Score - 9  Difficult doing work/chores - Somewhat difficult    Review of Systems  Constitutional: Negative.   HENT: Negative.   Eyes: Negative.   Respiratory:  Negative.   Cardiovascular: Negative.   Gastrointestinal: Negative.   Endocrine: Negative.   Genitourinary: Negative.   Musculoskeletal: Negative.   Skin: Negative.   Allergic/Immunologic: Negative.   Neurological: Negative.   Hematological: Negative.   Psychiatric/Behavioral: Positive for dysphoric mood.  All other systems reviewed and are negative.      Objective:   Physical Exam Gen: no distress, normal appearing HEENT: oral mucosa pink and moist, NCAT Cardio: Reg rate Chest: normal effort, normal rate of breathing Abd: soft, non-distended Ext: no edema Skin: intact Neuro: Alert and oriented x3.  Musculoskeletal: Diffuse tender points on both sides of her body- cervical myofascial pain,  Psych: pleasant, normal affect     Assessment & Plan:  Mrs. Oneil is a 49 year old woman who presents to establish care for fibromyalgia. Referred by Dr. Letta Pate- I have reviewed his notes and her medications prior to appointment.   There is presence of widespread pain above and below the waist on both sides for more than 3 months that is consistent with fibromyalgia. There are related symptoms of fatigue and cognitive dysfunction.  Did not tolerate Lyrica and prefers not to try Cymbalta.   Made goal to walk outside every morning. Advised that this is very helpful for depression and anxiety as well.   Restart monthly massage  Trigger point injections next month for diffuse palpable trigger points in cervical myofascia.  Advised yoga and  mediation for anxiety, depression, and pain relief.   Obesity: -Educated that current weight is 168 lbs and current BMI is 31.78lbs.  -Educated regarding health benefits of weight loss- for pain, general health, immune health, mental health.  -Will monitor weight every visit.  -Made goal to walk outside daily every morning.   Anxiety: Meditation as above Exercise as above.  Educated that exercise is equally effective as medication Educated regarding positive aspects of anxiety and how this can be reframed.   Depression: Medication as above. Exercise as above.  Educated that exercise is equally effective as medication  All questions answered. RTC in 1 month for trigger point injections

## 2019-10-24 ENCOUNTER — Encounter: Payer: Self-pay | Admitting: Physical Medicine and Rehabilitation

## 2019-10-24 ENCOUNTER — Encounter
Payer: BC Managed Care – PPO | Attending: Physical Medicine and Rehabilitation | Admitting: Physical Medicine and Rehabilitation

## 2019-10-24 ENCOUNTER — Other Ambulatory Visit: Payer: Self-pay

## 2019-10-24 VITALS — BP 128/88 | HR 101 | Temp 97.8°F | Ht 60.0 in | Wt 166.0 lb

## 2019-10-24 DIAGNOSIS — M7918 Myalgia, other site: Secondary | ICD-10-CM | POA: Insufficient documentation

## 2019-10-24 NOTE — Progress Notes (Signed)
Trigger Point Injection  Indication: Cervical myofascial pain not relieved by medication management and other conservative care.  Informed consent was obtained after describing risk and benefits of the procedure with the patient, this includes bleeding, bruising, infection and medication side effects.  The patient wishes to proceed and has given written consent.  The patient was placed in a seated position.  The cervical area was marked and prepped with Betadine.  It was entered with a 25-gauge 1/2 inch needle and a total of 5 mL of 1% lidocaine was injected into a total of 8 trigger points, after negative draw back for blood.  The patient tolerated the procedure well.  Post procedure instructions were given.

## 2019-11-21 ENCOUNTER — Other Ambulatory Visit: Payer: Self-pay

## 2019-11-21 ENCOUNTER — Encounter
Payer: BC Managed Care – PPO | Attending: Physical Medicine and Rehabilitation | Admitting: Physical Medicine and Rehabilitation

## 2019-11-21 ENCOUNTER — Encounter: Payer: Self-pay | Admitting: Physical Medicine and Rehabilitation

## 2019-11-21 VITALS — BP 125/88 | HR 102 | Ht 60.0 in | Wt 170.0 lb

## 2019-11-21 DIAGNOSIS — M7918 Myalgia, other site: Secondary | ICD-10-CM | POA: Diagnosis not present

## 2019-11-21 MED ORDER — AMITRIPTYLINE HCL 25 MG PO TABS: 25.0000 mg | ORAL_TABLET | Freq: Every day | ORAL | 1 refills | Status: DC

## 2019-11-21 NOTE — Patient Instructions (Signed)
.  Turmeric to reduce inflammation--can be used in cooking or taken as a supplement.  Benefits of turmeric:  -Highly anti-inflammatory  -Increases antioxidants  -Improves memory, attention, brain disease  -Lowers risk of heart disease  -May help prevent cancer  -Decreases pain  -Alleviates depression  -Delays aging and decreases risk of chronic disease  -Consume with black pepper to increase absorption    Turmeric Milk Recipe:  1 cup milk  1 tsp turmeric  1 tsp cinnamon  1 tsp grated ginger (optional)  Black pepper (boosts the anti-inflammatory properties of turmeric).  1 tsp honey

## 2019-11-21 NOTE — Progress Notes (Signed)
Trigger Point Injection  Indication: Cervical myofascial pain not relieved by medication management and other conservative care.  Informed consent was obtained after describing risk and benefits of the procedure with the patient, this includes bleeding, bruising, infection and medication side effects.  The patient wishes to proceed and has given written consent.  The patient was placed in a seated position.  The cervical area was marked and prepped with Betadine.  It was entered with a 25-gauge 1/2 inch needle and a total of 5 mL of 1% lidocaine and normal saline was injected into a total of 4 trigger points, after negative draw back for blood.  The patient tolerated the procedure well.  Post procedure instructions were given.

## 2019-12-15 ENCOUNTER — Other Ambulatory Visit: Payer: Self-pay | Admitting: Physical Medicine and Rehabilitation

## 2019-12-25 ENCOUNTER — Telehealth: Payer: Self-pay | Admitting: Physical Medicine and Rehabilitation

## 2019-12-25 NOTE — Telephone Encounter (Signed)
Patient is needing a medical necessity letter written for her to turn into insurance company for trigger point injections that she had done on 11/21/19 with Dr. Ranell Patrick.  Please call patient when letter is complete.

## 2019-12-26 ENCOUNTER — Encounter: Payer: Self-pay | Admitting: Physical Medicine and Rehabilitation

## 2019-12-26 NOTE — Telephone Encounter (Signed)
I have sent to [facility] and placed in her chart!

## 2019-12-26 NOTE — Telephone Encounter (Signed)
I have left a message for patient to call us back with a number to fax letter to, or she could come by office today to pick it up.

## 2020-01-18 ENCOUNTER — Encounter: Payer: BC Managed Care – PPO | Admitting: Physical Medicine and Rehabilitation

## 2020-04-21 ENCOUNTER — Other Ambulatory Visit: Payer: Self-pay | Admitting: Obstetrics and Gynecology

## 2020-04-21 DIAGNOSIS — Z1231 Encounter for screening mammogram for malignant neoplasm of breast: Secondary | ICD-10-CM

## 2020-06-09 ENCOUNTER — Ambulatory Visit
Admission: RE | Admit: 2020-06-09 | Discharge: 2020-06-09 | Disposition: A | Discharge status: Home / Self Care | Payer: BC Managed Care – PPO | Source: Ambulatory Visit | Attending: Obstetrics and Gynecology | Admitting: Obstetrics and Gynecology

## 2020-06-09 ENCOUNTER — Other Ambulatory Visit: Payer: Self-pay

## 2020-06-09 DIAGNOSIS — Z1231 Encounter for screening mammogram for malignant neoplasm of breast: Secondary | ICD-10-CM

## 2020-06-11 ENCOUNTER — Other Ambulatory Visit: Payer: Self-pay | Admitting: Obstetrics and Gynecology

## 2020-06-11 DIAGNOSIS — R928 Other abnormal and inconclusive findings on diagnostic imaging of breast: Secondary | ICD-10-CM

## 2020-06-20 ENCOUNTER — Ambulatory Visit
Admission: RE | Admit: 2020-06-20 | Discharge: 2020-06-20 | Disposition: A | Discharge status: Home / Self Care | Payer: BC Managed Care – PPO | Source: Ambulatory Visit | Attending: Obstetrics and Gynecology | Admitting: Obstetrics and Gynecology

## 2020-06-20 ENCOUNTER — Other Ambulatory Visit: Payer: Self-pay

## 2020-06-20 ENCOUNTER — Other Ambulatory Visit: Payer: Self-pay | Admitting: Obstetrics and Gynecology

## 2020-06-20 DIAGNOSIS — R928 Other abnormal and inconclusive findings on diagnostic imaging of breast: Secondary | ICD-10-CM

## 2020-06-30 ENCOUNTER — Other Ambulatory Visit: Payer: BC Managed Care – PPO

## 2020-07-02 ENCOUNTER — Other Ambulatory Visit: Payer: BC Managed Care – PPO

## 2020-07-16 ENCOUNTER — Other Ambulatory Visit: Payer: Self-pay | Admitting: Obstetrics and Gynecology

## 2020-07-16 DIAGNOSIS — N63 Unspecified lump in unspecified breast: Secondary | ICD-10-CM

## 2020-07-30 ENCOUNTER — Other Ambulatory Visit: Payer: BC Managed Care – PPO

## 2020-08-07 ENCOUNTER — Other Ambulatory Visit: Payer: Self-pay | Admitting: Obstetrics and Gynecology

## 2020-08-07 DIAGNOSIS — R928 Other abnormal and inconclusive findings on diagnostic imaging of breast: Secondary | ICD-10-CM

## 2020-09-09 ENCOUNTER — Other Ambulatory Visit: Payer: Self-pay | Admitting: Obstetrics and Gynecology

## 2020-09-09 ENCOUNTER — Ambulatory Visit
Admission: RE | Admit: 2020-09-09 | Discharge: 2020-09-09 | Disposition: A | Discharge status: Home / Self Care | Payer: BC Managed Care – PPO | Source: Ambulatory Visit | Attending: Obstetrics and Gynecology | Admitting: Obstetrics and Gynecology

## 2020-09-09 ENCOUNTER — Other Ambulatory Visit: Payer: Self-pay

## 2020-09-09 DIAGNOSIS — R928 Other abnormal and inconclusive findings on diagnostic imaging of breast: Secondary | ICD-10-CM

## 2020-10-21 ENCOUNTER — Other Ambulatory Visit: Payer: Self-pay | Admitting: Obstetrics and Gynecology

## 2020-10-21 DIAGNOSIS — M8580 Other specified disorders of bone density and structure, unspecified site: Secondary | ICD-10-CM

## 2020-12-23 ENCOUNTER — Other Ambulatory Visit: Payer: BC Managed Care – PPO

## 2021-04-08 ENCOUNTER — Ambulatory Visit
Admission: RE | Admit: 2021-04-08 | Discharge: 2021-04-08 | Disposition: A | Discharge status: Home / Self Care | Payer: BC Managed Care – PPO | Source: Ambulatory Visit | Attending: Obstetrics and Gynecology | Admitting: Obstetrics and Gynecology

## 2021-04-08 DIAGNOSIS — M8580 Other specified disorders of bone density and structure, unspecified site: Secondary | ICD-10-CM

## 2021-05-16 ENCOUNTER — Other Ambulatory Visit: Payer: Self-pay

## 2021-05-16 ENCOUNTER — Emergency Department (HOSPITAL_COMMUNITY)
Admission: EM | Admit: 2021-05-16 | Discharge: 2021-05-17 | Disposition: A | Discharge status: Home / Self Care | Payer: BC Managed Care – PPO | Attending: Emergency Medicine | Admitting: Emergency Medicine

## 2021-05-16 ENCOUNTER — Encounter (HOSPITAL_COMMUNITY): Payer: Self-pay

## 2021-05-16 ENCOUNTER — Emergency Department (HOSPITAL_COMMUNITY): Payer: BC Managed Care – PPO

## 2021-05-16 DIAGNOSIS — N9489 Other specified conditions associated with female genital organs and menstrual cycle: Secondary | ICD-10-CM | POA: Diagnosis not present

## 2021-05-16 DIAGNOSIS — N200 Calculus of kidney: Secondary | ICD-10-CM | POA: Insufficient documentation

## 2021-05-16 DIAGNOSIS — R350 Frequency of micturition: Secondary | ICD-10-CM | POA: Diagnosis present

## 2021-05-16 DIAGNOSIS — D72829 Elevated white blood cell count, unspecified: Secondary | ICD-10-CM | POA: Diagnosis not present

## 2021-05-16 DIAGNOSIS — N23 Unspecified renal colic: Secondary | ICD-10-CM

## 2021-05-16 LAB — LIPASE, BLOOD: Lipase: 52 U/L — ABNORMAL HIGH (ref 11–51)

## 2021-05-16 LAB — URINALYSIS, ROUTINE W REFLEX MICROSCOPIC
Bacteria, UA: NONE SEEN
Bilirubin Urine: NEGATIVE
Glucose, UA: NEGATIVE mg/dL
Ketones, ur: NEGATIVE mg/dL
Leukocytes,Ua: NEGATIVE
Nitrite: NEGATIVE
Protein, ur: 30 mg/dL — AB
Specific Gravity, Urine: 1.023 (ref 1.005–1.030)
pH: 5 (ref 5.0–8.0)

## 2021-05-16 LAB — CBC WITH DIFFERENTIAL/PLATELET
Abs Immature Granulocytes: 0.05 10*3/uL (ref 0.00–0.07)
Basophils Absolute: 0 10*3/uL (ref 0.0–0.1)
Basophils Relative: 0 %
Eosinophils Absolute: 0.2 10*3/uL (ref 0.0–0.5)
Eosinophils Relative: 2 %
HCT: 37.3 % (ref 36.0–46.0)
Hemoglobin: 12.4 g/dL (ref 12.0–15.0)
Immature Granulocytes: 0 %
Lymphocytes Relative: 25 %
Lymphs Abs: 3 10*3/uL (ref 0.7–4.0)
MCH: 30.3 pg (ref 26.0–34.0)
MCHC: 33.2 g/dL (ref 30.0–36.0)
MCV: 91.2 fL (ref 80.0–100.0)
Monocytes Absolute: 0.5 10*3/uL (ref 0.1–1.0)
Monocytes Relative: 4 %
Neutro Abs: 8.3 10*3/uL — ABNORMAL HIGH (ref 1.7–7.7)
Neutrophils Relative %: 69 %
Platelets: 345 10*3/uL (ref 150–400)
RBC: 4.09 MIL/uL (ref 3.87–5.11)
RDW: 13.4 % (ref 11.5–15.5)
WBC: 12.2 10*3/uL — ABNORMAL HIGH (ref 4.0–10.5)
nRBC: 0 % (ref 0.0–0.2)

## 2021-05-16 LAB — COMPREHENSIVE METABOLIC PANEL
ALT: 29 U/L (ref 0–44)
AST: 28 U/L (ref 15–41)
Albumin: 3.7 g/dL (ref 3.5–5.0)
Alkaline Phosphatase: 54 U/L (ref 38–126)
Anion gap: 8 (ref 5–15)
BUN: 17 mg/dL (ref 6–20)
CO2: 22 mmol/L (ref 22–32)
Calcium: 8.7 mg/dL — ABNORMAL LOW (ref 8.9–10.3)
Chloride: 107 mmol/L (ref 98–111)
Creatinine, Ser: 0.87 mg/dL (ref 0.44–1.00)
GFR, Estimated: >60 mL/min (ref >60)
Glucose, Bld: 171 mg/dL — ABNORMAL HIGH (ref 70–99)
Potassium: 3.2 mmol/L — ABNORMAL LOW (ref 3.5–5.1)
Sodium: 137 mmol/L (ref 135–145)
Total Bilirubin: 0.6 mg/dL (ref 0.3–1.2)
Total Protein: 7.2 g/dL (ref 6.5–8.1)

## 2021-05-16 LAB — HCG, QUANTITATIVE, PREGNANCY: hCG, Beta Chain, Quant, S: <1 m[IU]/mL (ref <5)

## 2021-05-16 MED ORDER — KETOROLAC TROMETHAMINE 60 MG/2ML IM SOLN
60.0000 mg | Freq: Once | INTRAMUSCULAR | Status: AC
  Administered 2021-05-16: 60 mg via INTRAMUSCULAR
  Filled 2021-05-16: qty 2

## 2021-05-16 MED ORDER — POTASSIUM CHLORIDE CRYS ER 20 MEQ PO TBCR
40.0000 meq | EXTENDED_RELEASE_TABLET | Freq: Once | ORAL | Status: AC
  Administered 2021-05-16: 40 meq via ORAL
  Filled 2021-05-16: qty 2

## 2021-05-16 NOTE — ED Provider Triage Note (Signed)
Emergency Medicine Provider Triage Evaluation Note ? ?Bobbie Stack , a 51 y.o. female  was evaluated in triage.  Pt complains of left-sided flank pain and inability to urinate.  Patient states that she had a kidney stone approximately 1 month ago.  She feels that she is trying to pass and feels that it may be blocking things.  She has had a sudden increase in left flank pain.  Denies hematuria, dysuria at this time.  Endorses left-sided flank pain and feels unable to urinate ? ?Review of Systems  ?Positive: Left-sided flank pain ?Negative: Nausea, vomiting ? ?Physical Exam  ?There were no vitals taken for this visit. ?Gen:   Awake, no distress   ?Resp:  Normal effort  ?MSK:   Moves extremities without difficulty  ?Other:   ? ?Medical Decision Making  ?Medically screening exam initiated at 9:23 PM.  Appropriate orders placed.  Haden Cavenaugh was informed that the remainder of the evaluation will be completed by another provider, this initial triage assessment does not replace that evaluation, and the importance of remaining in the ED until their evaluation is complete. ? ? ?  ?Dorothyann Peng, PA-C ?05/16/21 2125 ? ?

## 2021-05-16 NOTE — ED Provider Notes (Signed)
?[facility] ?Provider Note ? ? ?CSN: [id number] ?Arrival date & time: 05/16/21  2107 ? ?  ? ?History ? ?Chief Complaint  ?Patient presents with  ? Nephrolithiasis  ? ? ?Sarah Cobb is a 51 y.o. female. ? ?The history is provided by the patient.  ?Sarah Cobb is a 51 y.o. female who presents to the Emergency Department complaining of I think I have a kidney stone.  She presents to the emergency department for urinary frequency urgency that started today.  Around 815 this evening she developed left-sided low back pain that was severe in nature and radiated mildly to the left lower quadrant.  Pain feels similar to prior kidney stones.  At time of ED assessment her pain has significantly improved prior to intervention.  No fever, vomiting but she does have some nausea.  She did have chills last night, she is unsure if this is related to what is going on currently.  She has a history of recurrent kidney stones, has required lithotripsy in the past.  No additional medical problems. ? ?  ? ?Home Medications ?Prior to Admission medications   ?Medication Sig Start Date End Date Taking? Authorizing Provider  ?amitriptyline (ELAVIL) 25 MG tablet TAKE 1 TABLET BY MOUTH EVERYDAY AT BEDTIME 12/17/19   Raulkar, Clide Deutscher, MD  ?HYDROcodone-acetaminophen (NORCO/VICODIN) 5-325 MG tablet Take 1 tablet by mouth every 6 (six) hours as needed for moderate pain. 05/17/21  Yes Quintella Reichert, MD  ?ondansetron (ZOFRAN) 4 MG tablet Take 1 tablet (4 mg total) by mouth every 6 (six) hours. 05/17/21  Yes Quintella Reichert, MD  ?acyclovir (ZOVIRAX) 400 MG tablet Take by mouth. 03/23/18   [provider]  ?ALPRAZolam Duanne Moron) 0.25 MG tablet Take 0.25 mg by mouth 3 (three) times daily as needed for anxiety.  05/30/13   [provider]  ?betamethasone dipropionate 0.05 % cream betamethasone dipropionate 0.05 % topical cream ? APPLY TO AFFECTED AREA UP TO TWICE A DAY AS NEEDED (NOT  TO FACE, GROIN, OR UNDER ARMS)    [provider]  ?cyclobenzaprine (FLEXERIL) 10 MG tablet Take 1 tablet (10 mg total) by mouth 3 (three) times daily as needed for muscle spasms. 01/02/16   Cecilie Kicks, PA-C  ?dexlansoprazole (DEXILANT) 60 MG capsule Take 60 mg by mouth every morning.    [provider]  ?doxycycline (PERIOSTAT) 20 MG tablet Take 20 mg by mouth 2 (two) times daily. 08/11/19   [provider]  ?DULoxetine (CYMBALTA) 20 MG capsule TAKE 1 CAPSULE BY MOUTH EVERY DAY 09/11/19   Kirsteins, Luanna Salk, MD  ?Ivermectin (SOOLANTRA) 1 % CREA Apply topically daily. 06/21/19   [provider]  ?LO LOESTRIN FE 1 MG-10 MCG / 10 MCG tablet Take 1 tablet by mouth every morning.  05/25/13   [provider]  ?Multiple Vitamin (MULTIVITAMIN) tablet Take 1 tablet by mouth daily.    [provider]  ?OVER THE COUNTER MEDICATION Take 1 capsule by mouth daily.    [provider]  ?OVER THE COUNTER MEDICATION at bedtime.    [provider]  ?Probiotic Product (PROBIOTIC PO) Take by mouth.    [provider]  ?traMADol (ULTRAM) 50 MG tablet Take 1 tablet (50 mg total) by mouth every 6 (six) hours as needed for severe pain. 09/09/16   Dalia Heading, PA-C  ?   ? ?Allergies    ?Diazepam, Desvenlafaxine, and Gabapentin   ? ?Review of Systems   ?Review of  Systems  ?All other systems reviewed and are negative. ? ?Physical Exam ?Updated Vital Signs ?BP (!) 159/112 (BP Location: Left Arm)   Pulse 93   Temp 98.1 ?F (36.7 ?C) (Oral)   Resp 16   Ht 5' (1.524 m)   Wt 77.1 kg   SpO2 99%   BMI 33.20 kg/m?  ?Physical Exam ?Vitals and nursing note reviewed.  ?Constitutional:   ?   Appearance: She is well-developed.  ?HENT:  ?   Head: Normocephalic and atraumatic.  ?Cardiovascular:  ?   Rate and Rhythm: Normal rate and regular rhythm.  ?Pulmonary:  ?   Effort: Pulmonary effort is normal. No respiratory distress.  ?Abdominal:  ?   Palpations: Abdomen  is soft.  ?   Tenderness: There is no guarding or rebound.  ?   Comments: Mild llq, suprapubic tenderness  ?Musculoskeletal:     ?   General: No tenderness.  ?Skin: ?   General: Skin is warm and dry.  ?Neurological:  ?   Mental Status: She is alert and oriented to person, place, and time.  ?Psychiatric:     ?   Behavior: Behavior normal.  ? ? ?ED Results / Procedures / Treatments   ?Labs ?(all labs ordered are listed, but only abnormal results are displayed) ?Labs Reviewed  ?COMPREHENSIVE METABOLIC PANEL - Abnormal; Notable for the following components:  ?    Result Value  ? Potassium 3.2 (*)   ? Glucose, Bld 171 (*)   ? Calcium 8.7 (*)   ? All other components within normal limits  ?LIPASE, BLOOD - Abnormal; Notable for the following components:  ? Lipase 52 (*)   ? All other components within normal limits  ?CBC WITH DIFFERENTIAL/PLATELET - Abnormal; Notable for the following components:  ? WBC 12.2 (*)   ? Neutro Abs 8.3 (*)   ? All other components within normal limits  ?URINALYSIS, ROUTINE W REFLEX MICROSCOPIC - Abnormal; Notable for the following components:  ? Hgb urine dipstick SMALL (*)   ? Protein, ur 30 (*)   ? All other components within normal limits  ?HCG, QUANTITATIVE, PREGNANCY  ? ? ?EKG ?None ? ?Radiology ?CT Renal Stone Study ? ?Result Date: 05/16/2021 ?CLINICAL DATA:  Flank pain. EXAM: CT ABDOMEN AND PELVIS WITHOUT CONTRAST TECHNIQUE: Multidetector CT imaging of the abdomen and pelvis was performed following the standard protocol without IV contrast. RADIATION DOSE REDUCTION: This exam was performed according to the departmental dose-optimization program which includes automated exposure control, adjustment of the mA and/or kV according to patient size and/or use of iterative reconstruction technique. COMPARISON:  September 09, 2016 FINDINGS: Lower chest: No acute abnormality. Hepatobiliary: No focal liver abnormality is seen. Status post cholecystectomy. No biliary dilatation. Pancreas:  Unremarkable. No pancreatic ductal dilatation or surrounding inflammatory changes. Spleen: Normal in size without focal abnormality. Adrenals/Urinary Tract: Adrenal glands are unremarkable. Kidneys are normal, without obstructing renal calculi, focal lesion, or hydronephrosis. 2 mm and 3 mm nonobstructing renal calculi are seen within the right kidney. A 2 mm calcification is seen within the dependent portion of an otherwise normal appearing urinary bladder (axial CT image 75, CT series 2). Stomach/Bowel: Stomach is within normal limits. Appendix appears normal. No evidence of bowel wall thickening, distention, or inflammatory changes. Vascular/Lymphatic: No significant vascular findings are present. No enlarged abdominal or pelvic lymph nodes. Reproductive: Uterus and bilateral adnexa are unremarkable. Other: No abdominal wall hernia or abnormality. No abdominopelvic ascites. Musculoskeletal: No acute or significant osseous findings. IMPRESSION: 1.  2 mm and 3 mm nonobstructing right renal calculi. 2. 2 mm calcification within the urinary bladder which may represent a recently passed renal stone. 3. Evidence of prior cholecystectomy. Electronically Signed   By: Virgina Norfolk M.D.   On: 05/16/2021 22:28   ? ?Procedures ?Procedures  ? ? ?Medications Ordered in ED ?Medications  ?ketorolac (TORADOL) injection 60 mg (60 mg Intramuscular Given 05/16/21 2339)  ?potassium chloride SA (KLOR-CON M) CR tablet 40 mEq (40 mEq Oral Given 05/16/21 2339)  ? ? ?ED Course/ Medical Decision Making/ A&P ?  ?                        ?Medical Decision Making ?Amount and/or Complexity of Data Reviewed ?Labs: ordered. ? ?Risk ?Prescription drug management. ? ? ?Patient with history of recurrent kidney stones here for evaluation of left-sided low back pain, similar to prior stones.  CBC with leukocytosis, this is stable for her and ongoing issue.  BMP with stable renal function.  CT scan demonstrates 2 right-sided nonobstructing stones as  well as a stone in the bladder, possibly recently passed stone.  In the setting of patient's significant improvement in her symptoms, suspect that this is a recently passed stone.  She does have some persistent pain, wi

## 2021-05-16 NOTE — ED Triage Notes (Signed)
Pt reports with kidney stone pain to her left flank and states that she has been unable to urinate. Pt reports being diagnosed with kidney stones a month ago.  ?

## 2021-05-16 NOTE — Discharge Instructions (Addendum)
You have a 39m stone in your bladder and two stones in your right kidney (2 and 3 mm).   ?

## 2021-05-17 MED ORDER — HYDROCODONE-ACETAMINOPHEN 5-325 MG PO TABS: 1.0000 | ORAL_TABLET | Freq: Four times a day (QID) | ORAL | 0 refills | Status: AC | PRN

## 2021-05-17 MED ORDER — ONDANSETRON HCL 4 MG PO TABS: 4.0000 mg | ORAL_TABLET | Freq: Four times a day (QID) | ORAL | 0 refills | Status: AC

## 2021-07-08 ENCOUNTER — Other Ambulatory Visit (HOSPITAL_BASED_OUTPATIENT_CLINIC_OR_DEPARTMENT_OTHER): Payer: Self-pay

## 2021-07-08 MED ORDER — SEMAGLUTIDE-WEIGHT MANAGEMENT 0.25 MG/0.5ML ~~LOC~~ SOAJ
SUBCUTANEOUS | 0 refills | Status: AC
Start: 2021-07-06 — End: ?
  Filled 2021-07-08: qty 2, 28d supply, fill #0

## 2021-08-06 ENCOUNTER — Other Ambulatory Visit (HOSPITAL_BASED_OUTPATIENT_CLINIC_OR_DEPARTMENT_OTHER): Payer: Self-pay

## 2021-08-06 MED ORDER — WEGOVY 0.5 MG/0.5ML ~~LOC~~ SOAJ
SUBCUTANEOUS | 0 refills | Status: AC
Start: 2021-08-06 — End: ?
  Filled 2021-08-06: qty 2, 28d supply, fill #0

## 2021-08-12 ENCOUNTER — Other Ambulatory Visit (HOSPITAL_BASED_OUTPATIENT_CLINIC_OR_DEPARTMENT_OTHER): Payer: Self-pay

## 2021-08-31 ENCOUNTER — Other Ambulatory Visit (HOSPITAL_BASED_OUTPATIENT_CLINIC_OR_DEPARTMENT_OTHER): Payer: Self-pay

## 2021-08-31 MED ORDER — WEGOVY 1 MG/0.5ML ~~LOC~~ SOAJ
SUBCUTANEOUS | 0 refills | Status: AC
  Filled 2021-08-31: qty 2, 28d supply, fill #0

## 2021-09-01 ENCOUNTER — Other Ambulatory Visit (HOSPITAL_BASED_OUTPATIENT_CLINIC_OR_DEPARTMENT_OTHER): Payer: Self-pay

## 2021-10-05 ENCOUNTER — Other Ambulatory Visit (HOSPITAL_BASED_OUTPATIENT_CLINIC_OR_DEPARTMENT_OTHER): Payer: Self-pay

## 2021-10-05 MED ORDER — WEGOVY 1.7 MG/0.75ML ~~LOC~~ SOAJ
1.7000 mg | SUBCUTANEOUS | 0 refills | Status: AC
  Filled 2021-10-05: qty 3, 28d supply, fill #0

## 2021-10-06 ENCOUNTER — Other Ambulatory Visit (HOSPITAL_BASED_OUTPATIENT_CLINIC_OR_DEPARTMENT_OTHER): Payer: Self-pay

## 2021-10-08 ENCOUNTER — Other Ambulatory Visit: Payer: Self-pay | Admitting: Specialist

## 2021-10-08 DIAGNOSIS — M545 Low back pain, unspecified: Secondary | ICD-10-CM

## 2021-10-29 ENCOUNTER — Other Ambulatory Visit (HOSPITAL_BASED_OUTPATIENT_CLINIC_OR_DEPARTMENT_OTHER): Payer: Self-pay

## 2021-10-29 MED ORDER — WEGOVY 2.4 MG/0.75ML ~~LOC~~ SOAJ
SUBCUTANEOUS | 0 refills | Status: DC
  Filled 2021-10-29: qty 2, 28d supply, fill #0
  Filled 2021-10-30: qty 3, 28d supply, fill #0

## 2021-10-30 ENCOUNTER — Other Ambulatory Visit (HOSPITAL_BASED_OUTPATIENT_CLINIC_OR_DEPARTMENT_OTHER): Payer: Self-pay

## 2021-11-05 ENCOUNTER — Other Ambulatory Visit (HOSPITAL_BASED_OUTPATIENT_CLINIC_OR_DEPARTMENT_OTHER): Payer: Self-pay

## 2021-11-30 ENCOUNTER — Other Ambulatory Visit (HOSPITAL_BASED_OUTPATIENT_CLINIC_OR_DEPARTMENT_OTHER): Payer: Self-pay

## 2021-11-30 MED ORDER — WEGOVY 2.4 MG/0.75ML ~~LOC~~ SOAJ
SUBCUTANEOUS | 1 refills | Status: AC
  Filled 2021-11-30 – 2022-01-25 (×2): qty 2, 28d supply, fill #0
  Filled 2022-02-16 – 2022-04-11 (×3): qty 3, 28d supply, fill #1
  Filled 2022-04-12: qty 2, 28d supply, fill #1

## 2021-11-30 MED ORDER — WEGOVY 2.4 MG/0.75ML ~~LOC~~ SOAJ
2.4000 mg | SUBCUTANEOUS | 2 refills | Status: AC
  Filled 2021-11-30: qty 3, 28d supply, fill #0
  Filled 2022-01-01: qty 3, 28d supply, fill #1

## 2021-12-01 ENCOUNTER — Other Ambulatory Visit (HOSPITAL_BASED_OUTPATIENT_CLINIC_OR_DEPARTMENT_OTHER): Payer: Self-pay

## 2022-01-01 ENCOUNTER — Other Ambulatory Visit: Payer: Self-pay

## 2022-01-06 ENCOUNTER — Other Ambulatory Visit (HOSPITAL_BASED_OUTPATIENT_CLINIC_OR_DEPARTMENT_OTHER): Payer: Self-pay

## 2022-01-18 ENCOUNTER — Other Ambulatory Visit (HOSPITAL_BASED_OUTPATIENT_CLINIC_OR_DEPARTMENT_OTHER): Payer: Self-pay

## 2022-01-25 ENCOUNTER — Other Ambulatory Visit (HOSPITAL_BASED_OUTPATIENT_CLINIC_OR_DEPARTMENT_OTHER): Payer: Self-pay

## 2022-02-17 ENCOUNTER — Other Ambulatory Visit (HOSPITAL_BASED_OUTPATIENT_CLINIC_OR_DEPARTMENT_OTHER): Payer: Self-pay

## 2022-02-18 ENCOUNTER — Other Ambulatory Visit (HOSPITAL_BASED_OUTPATIENT_CLINIC_OR_DEPARTMENT_OTHER): Payer: Self-pay

## 2022-02-19 ENCOUNTER — Encounter (HOSPITAL_BASED_OUTPATIENT_CLINIC_OR_DEPARTMENT_OTHER): Payer: Self-pay

## 2022-02-19 ENCOUNTER — Other Ambulatory Visit (HOSPITAL_BASED_OUTPATIENT_CLINIC_OR_DEPARTMENT_OTHER): Payer: Self-pay

## 2022-02-25 ENCOUNTER — Other Ambulatory Visit: Payer: Self-pay

## 2022-02-25 ENCOUNTER — Other Ambulatory Visit (HOSPITAL_BASED_OUTPATIENT_CLINIC_OR_DEPARTMENT_OTHER): Payer: Self-pay

## 2022-03-01 ENCOUNTER — Other Ambulatory Visit (HOSPITAL_BASED_OUTPATIENT_CLINIC_OR_DEPARTMENT_OTHER): Payer: Self-pay

## 2022-03-02 ENCOUNTER — Other Ambulatory Visit (HOSPITAL_BASED_OUTPATIENT_CLINIC_OR_DEPARTMENT_OTHER): Payer: Self-pay

## 2022-03-07 ENCOUNTER — Other Ambulatory Visit (HOSPITAL_BASED_OUTPATIENT_CLINIC_OR_DEPARTMENT_OTHER): Payer: Self-pay

## 2022-03-10 ENCOUNTER — Other Ambulatory Visit (HOSPITAL_BASED_OUTPATIENT_CLINIC_OR_DEPARTMENT_OTHER): Payer: Self-pay

## 2022-03-11 ENCOUNTER — Other Ambulatory Visit (HOSPITAL_BASED_OUTPATIENT_CLINIC_OR_DEPARTMENT_OTHER): Payer: Self-pay

## 2022-04-01 ENCOUNTER — Other Ambulatory Visit: Payer: Self-pay

## 2022-04-02 ENCOUNTER — Other Ambulatory Visit (HOSPITAL_BASED_OUTPATIENT_CLINIC_OR_DEPARTMENT_OTHER): Payer: Self-pay

## 2022-04-11 ENCOUNTER — Other Ambulatory Visit (HOSPITAL_BASED_OUTPATIENT_CLINIC_OR_DEPARTMENT_OTHER): Payer: Self-pay

## 2022-04-12 ENCOUNTER — Other Ambulatory Visit (HOSPITAL_BASED_OUTPATIENT_CLINIC_OR_DEPARTMENT_OTHER): Payer: Self-pay

## 2022-04-13 ENCOUNTER — Other Ambulatory Visit (HOSPITAL_BASED_OUTPATIENT_CLINIC_OR_DEPARTMENT_OTHER): Payer: Self-pay

## 2022-04-13 ENCOUNTER — Encounter (HOSPITAL_BASED_OUTPATIENT_CLINIC_OR_DEPARTMENT_OTHER): Payer: Self-pay | Admitting: Pharmacist

## 2022-04-13 ENCOUNTER — Other Ambulatory Visit: Payer: Self-pay

## 2022-04-13 MED ORDER — WEGOVY 0.5 MG/0.5ML ~~LOC~~ SOAJ
0.5000 mg | SUBCUTANEOUS | 0 refills | Status: AC
  Filled 2022-04-13: qty 2, 28d supply, fill #0

## 2022-04-14 ENCOUNTER — Other Ambulatory Visit (HOSPITAL_BASED_OUTPATIENT_CLINIC_OR_DEPARTMENT_OTHER): Payer: Self-pay

## 2022-05-06 ENCOUNTER — Other Ambulatory Visit (HOSPITAL_BASED_OUTPATIENT_CLINIC_OR_DEPARTMENT_OTHER): Payer: Self-pay

## 2022-05-06 MED ORDER — WEGOVY 1 MG/0.5ML ~~LOC~~ SOAJ
SUBCUTANEOUS | 0 refills | Status: DC
Start: 2022-05-06 — End: 2022-05-10

## 2022-05-07 ENCOUNTER — Other Ambulatory Visit (HOSPITAL_BASED_OUTPATIENT_CLINIC_OR_DEPARTMENT_OTHER): Payer: Self-pay

## 2022-05-10 ENCOUNTER — Other Ambulatory Visit (HOSPITAL_BASED_OUTPATIENT_CLINIC_OR_DEPARTMENT_OTHER): Payer: Self-pay

## 2022-05-10 MED ORDER — WEGOVY 1 MG/0.5ML ~~LOC~~ SOAJ
1.0000 mg | SUBCUTANEOUS | 0 refills | Status: AC
  Filled 2022-05-10: qty 2, 28d supply, fill #0
  Filled 2022-06-07 – 2022-06-17 (×2): qty 2, 28d supply, fill #1

## 2022-05-12 ENCOUNTER — Other Ambulatory Visit (HOSPITAL_BASED_OUTPATIENT_CLINIC_OR_DEPARTMENT_OTHER): Payer: Self-pay

## 2022-05-13 ENCOUNTER — Other Ambulatory Visit (HOSPITAL_BASED_OUTPATIENT_CLINIC_OR_DEPARTMENT_OTHER): Payer: Self-pay

## 2022-06-01 ENCOUNTER — Other Ambulatory Visit (HOSPITAL_BASED_OUTPATIENT_CLINIC_OR_DEPARTMENT_OTHER): Payer: Self-pay

## 2022-06-01 MED ORDER — WEGOVY 1 MG/0.5ML ~~LOC~~ SOAJ
1.0000 mg | SUBCUTANEOUS | 0 refills | Status: DC
  Filled 2022-06-01 – 2022-08-19 (×2): qty 2, 28d supply, fill #0

## 2022-06-02 ENCOUNTER — Other Ambulatory Visit (HOSPITAL_BASED_OUTPATIENT_CLINIC_OR_DEPARTMENT_OTHER): Payer: Self-pay

## 2022-06-13 ENCOUNTER — Other Ambulatory Visit (HOSPITAL_BASED_OUTPATIENT_CLINIC_OR_DEPARTMENT_OTHER): Payer: Self-pay

## 2022-06-17 ENCOUNTER — Other Ambulatory Visit (HOSPITAL_BASED_OUTPATIENT_CLINIC_OR_DEPARTMENT_OTHER): Payer: Self-pay

## 2022-08-19 ENCOUNTER — Other Ambulatory Visit (HOSPITAL_BASED_OUTPATIENT_CLINIC_OR_DEPARTMENT_OTHER): Payer: Self-pay

## 2022-08-20 ENCOUNTER — Other Ambulatory Visit (HOSPITAL_BASED_OUTPATIENT_CLINIC_OR_DEPARTMENT_OTHER): Payer: Self-pay

## 2022-09-17 ENCOUNTER — Other Ambulatory Visit (HOSPITAL_BASED_OUTPATIENT_CLINIC_OR_DEPARTMENT_OTHER): Payer: Self-pay

## 2022-09-17 MED ORDER — WEGOVY 1.7 MG/0.75ML ~~LOC~~ SOAJ
1.7000 mg | SUBCUTANEOUS | 6 refills | Status: AC
  Filled 2022-09-17: qty 3, 28d supply, fill #0
  Filled 2022-09-28: qty 3, 28d supply, fill #1
  Filled 2022-09-28 (×2): qty 3, 28d supply, fill #0
  Filled 2022-11-04: qty 3, 28d supply, fill #1
  Filled 2022-12-03: qty 3, 28d supply, fill #2
  Filled 2022-12-03: qty 3, 28d supply, fill #0
  Filled 2023-01-13: qty 3, 28d supply, fill #1
  Filled 2023-02-08: qty 3, 28d supply, fill #2
  Filled 2023-09-03 – 2023-09-06 (×3): qty 3, 28d supply, fill #3

## 2022-09-28 ENCOUNTER — Other Ambulatory Visit (HOSPITAL_BASED_OUTPATIENT_CLINIC_OR_DEPARTMENT_OTHER): Payer: Self-pay

## 2022-10-08 ENCOUNTER — Other Ambulatory Visit (HOSPITAL_BASED_OUTPATIENT_CLINIC_OR_DEPARTMENT_OTHER): Payer: Self-pay

## 2022-11-04 ENCOUNTER — Other Ambulatory Visit (HOSPITAL_BASED_OUTPATIENT_CLINIC_OR_DEPARTMENT_OTHER): Payer: Self-pay

## 2022-12-03 ENCOUNTER — Other Ambulatory Visit (HOSPITAL_BASED_OUTPATIENT_CLINIC_OR_DEPARTMENT_OTHER): Payer: Self-pay

## 2022-12-03 ENCOUNTER — Other Ambulatory Visit (HOSPITAL_COMMUNITY): Payer: Self-pay

## 2022-12-03 ENCOUNTER — Other Ambulatory Visit: Payer: Self-pay

## 2023-01-13 ENCOUNTER — Other Ambulatory Visit (HOSPITAL_COMMUNITY): Payer: Self-pay

## 2023-02-08 ENCOUNTER — Other Ambulatory Visit: Payer: Self-pay

## 2023-02-18 ENCOUNTER — Other Ambulatory Visit (HOSPITAL_COMMUNITY): Payer: Self-pay

## 2023-02-18 ENCOUNTER — Encounter: Payer: Self-pay | Admitting: Pharmacist

## 2023-02-18 ENCOUNTER — Other Ambulatory Visit: Payer: Self-pay

## 2023-02-18 MED ORDER — WEGOVY 2.4 MG/0.75ML ~~LOC~~ SOAJ
2.4000 mg | SUBCUTANEOUS | 1 refills | Status: AC
  Filled 2023-02-18 – 2023-03-20 (×2): qty 3, 28d supply, fill #0
  Filled 2023-04-25: qty 3, 28d supply, fill #1
  Filled 2023-10-20: qty 3, 28d supply, fill #2
  Filled 2023-12-05: qty 3, 28d supply, fill #3
  Filled 2024-01-26 – 2024-02-03 (×2): qty 3, 28d supply, fill #4

## 2023-03-21 ENCOUNTER — Other Ambulatory Visit (HOSPITAL_COMMUNITY): Payer: Self-pay

## 2023-03-21 ENCOUNTER — Other Ambulatory Visit: Payer: Self-pay

## 2023-04-25 ENCOUNTER — Other Ambulatory Visit (HOSPITAL_COMMUNITY): Payer: Self-pay

## 2023-04-29 ENCOUNTER — Other Ambulatory Visit: Payer: Self-pay

## 2023-06-01 ENCOUNTER — Other Ambulatory Visit: Payer: Self-pay

## 2023-06-01 ENCOUNTER — Other Ambulatory Visit (HOSPITAL_COMMUNITY): Payer: Self-pay

## 2023-06-01 ENCOUNTER — Other Ambulatory Visit (HOSPITAL_BASED_OUTPATIENT_CLINIC_OR_DEPARTMENT_OTHER): Payer: Self-pay

## 2023-06-01 MED ORDER — WEGOVY 0.5 MG/0.5ML ~~LOC~~ SOAJ
0.5000 mg | SUBCUTANEOUS | 0 refills | Status: AC
  Filled 2023-06-01 – 2023-07-03 (×2): qty 2, 28d supply, fill #0

## 2023-06-01 MED ORDER — WEGOVY 0.5 MG/0.5ML ~~LOC~~ SOAJ
0.5000 mg | SUBCUTANEOUS | 0 refills | Status: AC
  Filled 2023-06-01 (×2): qty 2, 28d supply, fill #0

## 2023-07-04 ENCOUNTER — Other Ambulatory Visit: Payer: Self-pay

## 2023-07-04 ENCOUNTER — Other Ambulatory Visit (HOSPITAL_BASED_OUTPATIENT_CLINIC_OR_DEPARTMENT_OTHER): Payer: Self-pay

## 2023-07-04 ENCOUNTER — Other Ambulatory Visit (HOSPITAL_COMMUNITY): Payer: Self-pay

## 2023-07-04 ENCOUNTER — Encounter: Payer: Self-pay | Admitting: Pharmacist

## 2023-07-27 ENCOUNTER — Other Ambulatory Visit (HOSPITAL_BASED_OUTPATIENT_CLINIC_OR_DEPARTMENT_OTHER): Payer: Self-pay

## 2023-07-27 MED ORDER — WEGOVY 1 MG/0.5ML ~~LOC~~ SOAJ
1.0000 mg | SUBCUTANEOUS | 0 refills | Status: AC
  Filled 2023-07-27 – 2023-07-29 (×2): qty 2, 28d supply, fill #0

## 2023-07-29 ENCOUNTER — Other Ambulatory Visit: Payer: Self-pay

## 2023-07-29 ENCOUNTER — Other Ambulatory Visit (HOSPITAL_BASED_OUTPATIENT_CLINIC_OR_DEPARTMENT_OTHER): Payer: Self-pay

## 2023-07-29 ENCOUNTER — Other Ambulatory Visit (HOSPITAL_COMMUNITY): Payer: Self-pay

## 2023-07-30 ENCOUNTER — Other Ambulatory Visit (HOSPITAL_COMMUNITY): Payer: Self-pay

## 2023-08-01 ENCOUNTER — Other Ambulatory Visit (HOSPITAL_COMMUNITY): Payer: Self-pay

## 2023-08-02 ENCOUNTER — Other Ambulatory Visit (HOSPITAL_COMMUNITY): Payer: Self-pay

## 2023-08-10 ENCOUNTER — Other Ambulatory Visit (HOSPITAL_BASED_OUTPATIENT_CLINIC_OR_DEPARTMENT_OTHER): Payer: Self-pay

## 2023-08-10 MED ORDER — DEXLANSOPRAZOLE 60 MG PO CPDR
60.0000 mg | DELAYED_RELEASE_CAPSULE | Freq: Every day | ORAL | 3 refills | Status: AC
  Filled 2023-08-10 (×2): qty 90, 90d supply, fill #0

## 2023-08-18 ENCOUNTER — Other Ambulatory Visit (HOSPITAL_BASED_OUTPATIENT_CLINIC_OR_DEPARTMENT_OTHER): Payer: Self-pay

## 2023-09-05 ENCOUNTER — Other Ambulatory Visit: Payer: Self-pay

## 2023-09-06 ENCOUNTER — Other Ambulatory Visit (HOSPITAL_COMMUNITY): Payer: Self-pay

## 2023-09-07 ENCOUNTER — Other Ambulatory Visit: Payer: Self-pay

## 2023-10-20 ENCOUNTER — Other Ambulatory Visit: Payer: Self-pay

## 2023-10-20 ENCOUNTER — Other Ambulatory Visit (HOSPITAL_COMMUNITY): Payer: Self-pay

## 2023-12-06 ENCOUNTER — Other Ambulatory Visit: Payer: Self-pay

## 2023-12-06 ENCOUNTER — Other Ambulatory Visit (HOSPITAL_COMMUNITY): Payer: Self-pay

## 2024-01-27 ENCOUNTER — Other Ambulatory Visit (HOSPITAL_COMMUNITY): Payer: Self-pay

## 2024-01-31 ENCOUNTER — Other Ambulatory Visit (HOSPITAL_COMMUNITY): Payer: Self-pay

## 2024-01-31 ENCOUNTER — Encounter: Payer: Self-pay | Admitting: Pharmacist

## 2024-01-31 ENCOUNTER — Other Ambulatory Visit: Payer: Self-pay

## 2024-02-03 ENCOUNTER — Other Ambulatory Visit: Payer: Self-pay

## 2024-02-06 ENCOUNTER — Encounter: Payer: Self-pay | Admitting: Pharmacist

## 2024-02-06 ENCOUNTER — Other Ambulatory Visit (HOSPITAL_COMMUNITY): Payer: Self-pay

## 2024-02-06 ENCOUNTER — Other Ambulatory Visit: Payer: Self-pay

## 2024-02-07 ENCOUNTER — Encounter (HOSPITAL_BASED_OUTPATIENT_CLINIC_OR_DEPARTMENT_OTHER): Payer: Self-pay

## 2024-02-07 ENCOUNTER — Other Ambulatory Visit (HOSPITAL_BASED_OUTPATIENT_CLINIC_OR_DEPARTMENT_OTHER): Payer: Self-pay

## 2024-02-09 ENCOUNTER — Other Ambulatory Visit: Payer: Self-pay
